# Patient Record
Sex: Female | Born: 1950 | Race: White | Hispanic: No | Marital: Married | Smoking: Former smoker
Health system: Southern US, Community
[De-identification: ages and names within clinical notes are randomized; demographics above are authoritative.]

## PROBLEM LIST (undated history)

## (undated) DIAGNOSIS — M419 Scoliosis, unspecified: Secondary | ICD-10-CM

## (undated) DIAGNOSIS — Z862 Personal history of diseases of the blood and blood-forming organs and certain disorders involving the immune mechanism: Secondary | ICD-10-CM

## (undated) DIAGNOSIS — R569 Unspecified convulsions: Secondary | ICD-10-CM

## (undated) DIAGNOSIS — F329 Major depressive disorder, single episode, unspecified: Secondary | ICD-10-CM

## (undated) DIAGNOSIS — F0280 Dementia in other diseases classified elsewhere without behavioral disturbance: Secondary | ICD-10-CM

## (undated) DIAGNOSIS — G300 Alzheimer's disease with early onset: Principal | ICD-10-CM

## (undated) DIAGNOSIS — M8580 Other specified disorders of bone density and structure, unspecified site: Secondary | ICD-10-CM

## (undated) DIAGNOSIS — F909 Attention-deficit hyperactivity disorder, unspecified type: Secondary | ICD-10-CM

## (undated) DIAGNOSIS — F32A Depression, unspecified: Secondary | ICD-10-CM

## (undated) HISTORY — DX: Alzheimer's disease with early onset: G30.0

## (undated) HISTORY — DX: Attention-deficit hyperactivity disorder, unspecified type: F90.9

## (undated) HISTORY — PX: NASAL SINUS SURGERY: SHX719

## (undated) HISTORY — PX: BREAST ENHANCEMENT SURGERY: SHX7

## (undated) HISTORY — DX: Other specified disorders of bone density and structure, unspecified site: M85.80

## (undated) HISTORY — DX: Depression, unspecified: F32.A

## (undated) HISTORY — DX: Major depressive disorder, single episode, unspecified: F32.9

## (undated) HISTORY — DX: Personal history of diseases of the blood and blood-forming organs and certain disorders involving the immune mechanism: Z86.2

## (undated) HISTORY — DX: Scoliosis, unspecified: M41.9

## (undated) HISTORY — DX: Unspecified convulsions: R56.9

## (undated) HISTORY — DX: Dementia in other diseases classified elsewhere, unspecified severity, without behavioral disturbance, psychotic disturbance, mood disturbance, and anxiety: F02.80

---

## 1998-12-28 ENCOUNTER — Other Ambulatory Visit: Admission: RE | Admit: 1998-12-28 | Discharge: 1998-12-28 | Payer: Self-pay | Admitting: Internal Medicine

## 2000-01-03 DIAGNOSIS — M8580 Other specified disorders of bone density and structure, unspecified site: Secondary | ICD-10-CM

## 2000-01-03 HISTORY — DX: Other specified disorders of bone density and structure, unspecified site: M85.80

## 2002-05-30 ENCOUNTER — Other Ambulatory Visit: Admission: RE | Admit: 2002-05-30 | Discharge: 2002-05-30 | Payer: Self-pay | Admitting: Family Medicine

## 2004-05-13 ENCOUNTER — Other Ambulatory Visit: Admission: RE | Admit: 2004-05-13 | Discharge: 2004-05-13 | Payer: Self-pay | Admitting: Family Medicine

## 2004-05-13 ENCOUNTER — Ambulatory Visit: Payer: Self-pay | Admitting: Family Medicine

## 2004-05-31 ENCOUNTER — Ambulatory Visit: Payer: Self-pay | Admitting: Cardiology

## 2004-06-02 HISTORY — PX: NM MYOVIEW LTD: HXRAD82

## 2004-06-07 ENCOUNTER — Ambulatory Visit: Payer: Self-pay

## 2004-06-07 ENCOUNTER — Ambulatory Visit: Payer: Self-pay | Admitting: Cardiology

## 2004-06-09 ENCOUNTER — Ambulatory Visit: Payer: Self-pay | Admitting: Family Medicine

## 2004-07-01 ENCOUNTER — Ambulatory Visit: Payer: Self-pay | Admitting: Internal Medicine

## 2004-07-08 ENCOUNTER — Ambulatory Visit: Payer: Self-pay | Admitting: Family Medicine

## 2004-08-24 ENCOUNTER — Ambulatory Visit: Payer: Self-pay | Admitting: Family Medicine

## 2004-08-24 ENCOUNTER — Encounter (INDEPENDENT_AMBULATORY_CARE_PROVIDER_SITE_OTHER): Payer: Self-pay | Admitting: Specialist

## 2004-08-24 ENCOUNTER — Other Ambulatory Visit: Admission: RE | Admit: 2004-08-24 | Discharge: 2004-08-24 | Payer: Self-pay | Admitting: Family Medicine

## 2006-02-15 ENCOUNTER — Ambulatory Visit: Payer: Self-pay | Admitting: Family Medicine

## 2006-02-15 ENCOUNTER — Other Ambulatory Visit: Admission: RE | Admit: 2006-02-15 | Discharge: 2006-02-15 | Payer: Self-pay | Admitting: Family Medicine

## 2006-02-15 ENCOUNTER — Encounter: Payer: Self-pay | Admitting: Family Medicine

## 2006-05-04 ENCOUNTER — Telehealth (INDEPENDENT_AMBULATORY_CARE_PROVIDER_SITE_OTHER): Payer: Self-pay | Admitting: *Deleted

## 2006-06-28 ENCOUNTER — Telehealth (INDEPENDENT_AMBULATORY_CARE_PROVIDER_SITE_OTHER): Payer: Self-pay | Admitting: *Deleted

## 2006-08-07 ENCOUNTER — Telehealth (INDEPENDENT_AMBULATORY_CARE_PROVIDER_SITE_OTHER): Payer: Self-pay | Admitting: *Deleted

## 2006-09-19 ENCOUNTER — Telehealth (INDEPENDENT_AMBULATORY_CARE_PROVIDER_SITE_OTHER): Payer: Self-pay | Admitting: *Deleted

## 2006-10-03 ENCOUNTER — Telehealth (INDEPENDENT_AMBULATORY_CARE_PROVIDER_SITE_OTHER): Payer: Self-pay | Admitting: *Deleted

## 2006-11-23 ENCOUNTER — Telehealth (INDEPENDENT_AMBULATORY_CARE_PROVIDER_SITE_OTHER): Payer: Self-pay | Admitting: *Deleted

## 2006-12-11 ENCOUNTER — Telehealth (INDEPENDENT_AMBULATORY_CARE_PROVIDER_SITE_OTHER): Payer: Self-pay | Admitting: *Deleted

## 2007-01-23 ENCOUNTER — Telehealth: Payer: Self-pay | Admitting: Family Medicine

## 2007-03-20 ENCOUNTER — Telehealth: Payer: Self-pay | Admitting: Family Medicine

## 2007-06-26 ENCOUNTER — Ambulatory Visit: Payer: Self-pay | Admitting: Psychology

## 2007-07-08 ENCOUNTER — Ambulatory Visit: Payer: Self-pay | Admitting: Psychology

## 2007-07-08 ENCOUNTER — Telehealth: Payer: Self-pay | Admitting: Family Medicine

## 2007-07-11 ENCOUNTER — Encounter: Payer: Self-pay | Admitting: Family Medicine

## 2007-07-11 ENCOUNTER — Other Ambulatory Visit: Admission: RE | Admit: 2007-07-11 | Discharge: 2007-07-11 | Payer: Self-pay | Admitting: Family Medicine

## 2007-07-11 ENCOUNTER — Ambulatory Visit: Payer: Self-pay | Admitting: Family Medicine

## 2007-07-11 DIAGNOSIS — M810 Age-related osteoporosis without current pathological fracture: Secondary | ICD-10-CM | POA: Insufficient documentation

## 2007-07-11 DIAGNOSIS — F418 Other specified anxiety disorders: Secondary | ICD-10-CM | POA: Insufficient documentation

## 2007-07-11 LAB — CONVERTED CEMR LAB: Pap Smear: NORMAL

## 2007-07-25 ENCOUNTER — Telehealth (INDEPENDENT_AMBULATORY_CARE_PROVIDER_SITE_OTHER): Payer: Self-pay | Admitting: *Deleted

## 2007-07-26 ENCOUNTER — Ambulatory Visit: Payer: Self-pay | Admitting: Family Medicine

## 2007-07-26 LAB — CONVERTED CEMR LAB
Nitrite: NEGATIVE
Protein, U semiquant: NEGATIVE
Urine crystals, microscopic: 0 /hpf
Urobilinogen, UA: 0.2
Yeast, UA: 0

## 2007-07-27 ENCOUNTER — Encounter: Payer: Self-pay | Admitting: Family Medicine

## 2007-07-29 ENCOUNTER — Ambulatory Visit: Payer: Self-pay | Admitting: Psychology

## 2007-08-21 ENCOUNTER — Telehealth: Payer: Self-pay | Admitting: Family Medicine

## 2007-08-23 ENCOUNTER — Ambulatory Visit: Payer: Self-pay | Admitting: Psychology

## 2007-08-30 ENCOUNTER — Telehealth (INDEPENDENT_AMBULATORY_CARE_PROVIDER_SITE_OTHER): Payer: Self-pay | Admitting: *Deleted

## 2007-09-05 ENCOUNTER — Telehealth (INDEPENDENT_AMBULATORY_CARE_PROVIDER_SITE_OTHER): Payer: Self-pay | Admitting: *Deleted

## 2007-09-10 ENCOUNTER — Encounter: Payer: Self-pay | Admitting: Family Medicine

## 2007-09-10 LAB — HM MAMMOGRAPHY: HM Mammogram: NORMAL

## 2007-10-07 ENCOUNTER — Ambulatory Visit: Payer: Self-pay | Admitting: Family Medicine

## 2007-10-08 LAB — CONVERTED CEMR LAB
ALT: 39 units/L — ABNORMAL HIGH (ref 0–35)
AST: 34 units/L (ref 0–37)
Albumin: 4 g/dL (ref 3.5–5.2)
BUN: 16 mg/dL (ref 6–23)
Basophils Absolute: 0 10*3/uL (ref 0.0–0.1)
Basophils Relative: 0.5 % (ref 0.0–3.0)
CO2: 29 meq/L (ref 19–32)
Calcium: 9.3 mg/dL (ref 8.4–10.5)
Chloride: 107 meq/L (ref 96–112)
Cholesterol: 182 mg/dL (ref 0–200)
Creatinine, Ser: 0.8 mg/dL (ref 0.4–1.2)
Eosinophils Relative: 1.1 % (ref 0.0–5.0)
Hemoglobin: 13.9 g/dL (ref 12.0–15.0)
LDL Cholesterol: 80 mg/dL (ref 0–99)
Lymphocytes Relative: 23.3 % (ref 12.0–46.0)
MCHC: 35.3 g/dL (ref 30.0–36.0)
MCV: 91.6 fL (ref 78.0–100.0)
Neutro Abs: 4.5 10*3/uL (ref 1.4–7.7)
Neutrophils Relative %: 65.5 % (ref 43.0–77.0)
RBC: 4.29 M/uL (ref 3.87–5.11)
TSH: 0.92 microintl units/mL (ref 0.35–5.50)
Total Protein: 7.3 g/dL (ref 6.0–8.3)
VLDL: 18 mg/dL (ref 0–40)
WBC: 6.9 10*3/uL (ref 4.5–10.5)

## 2007-10-09 LAB — CONVERTED CEMR LAB: Vit D, 1,25-Dihydroxy: 36 (ref 30–89)

## 2007-10-14 ENCOUNTER — Ambulatory Visit: Payer: Self-pay | Admitting: Family Medicine

## 2007-10-14 DIAGNOSIS — M4120 Other idiopathic scoliosis, site unspecified: Secondary | ICD-10-CM | POA: Insufficient documentation

## 2007-10-14 DIAGNOSIS — E785 Hyperlipidemia, unspecified: Secondary | ICD-10-CM | POA: Insufficient documentation

## 2007-10-28 ENCOUNTER — Telehealth: Payer: Self-pay | Admitting: Family Medicine

## 2007-12-18 ENCOUNTER — Telehealth: Payer: Self-pay | Admitting: Family Medicine

## 2007-12-19 ENCOUNTER — Telehealth: Payer: Self-pay | Admitting: Family Medicine

## 2008-01-13 ENCOUNTER — Ambulatory Visit: Payer: Self-pay | Admitting: Psychology

## 2008-02-25 ENCOUNTER — Ambulatory Visit: Payer: Self-pay | Admitting: Psychology

## 2008-03-02 ENCOUNTER — Telehealth: Payer: Self-pay | Admitting: Family Medicine

## 2008-04-28 ENCOUNTER — Telehealth: Payer: Self-pay | Admitting: Family Medicine

## 2008-04-29 ENCOUNTER — Ambulatory Visit: Payer: Self-pay | Admitting: Psychology

## 2008-05-02 HISTORY — PX: OTHER SURGICAL HISTORY: SHX169

## 2008-05-05 ENCOUNTER — Telehealth: Payer: Self-pay | Admitting: Family Medicine

## 2008-05-05 ENCOUNTER — Ambulatory Visit: Payer: Self-pay | Admitting: Family Medicine

## 2008-05-05 DIAGNOSIS — N950 Postmenopausal bleeding: Secondary | ICD-10-CM | POA: Insufficient documentation

## 2008-05-12 ENCOUNTER — Ambulatory Visit (HOSPITAL_COMMUNITY): Admission: RE | Admit: 2008-05-12 | Discharge: 2008-05-12 | Payer: Self-pay | Admitting: Family Medicine

## 2008-07-07 ENCOUNTER — Telehealth: Payer: Self-pay | Admitting: Family Medicine

## 2008-08-19 ENCOUNTER — Telehealth: Payer: Self-pay | Admitting: Family Medicine

## 2008-09-03 ENCOUNTER — Telehealth: Payer: Self-pay | Admitting: Family Medicine

## 2008-10-14 ENCOUNTER — Telehealth: Payer: Self-pay | Admitting: Family Medicine

## 2008-12-07 ENCOUNTER — Telehealth: Payer: Self-pay | Admitting: Family Medicine

## 2009-02-18 ENCOUNTER — Telehealth: Payer: Self-pay | Admitting: Family Medicine

## 2009-03-15 ENCOUNTER — Ambulatory Visit: Payer: Self-pay | Admitting: Family Medicine

## 2009-03-15 ENCOUNTER — Telehealth: Payer: Self-pay | Admitting: Family Medicine

## 2009-03-15 DIAGNOSIS — R5381 Other malaise: Secondary | ICD-10-CM | POA: Insufficient documentation

## 2009-03-15 DIAGNOSIS — R5383 Other fatigue: Secondary | ICD-10-CM

## 2009-03-16 ENCOUNTER — Encounter: Payer: Self-pay | Admitting: Family Medicine

## 2009-03-16 LAB — CONVERTED CEMR LAB
ALT: 18 units/L (ref 0–35)
Alkaline Phosphatase: 104 units/L (ref 39–117)
BUN: 14 mg/dL (ref 6–23)
Bilirubin, Direct: 0 mg/dL (ref 0.0–0.3)
Calcium: 9.5 mg/dL (ref 8.4–10.5)
Creatinine, Ser: 0.7 mg/dL (ref 0.4–1.2)
Eosinophils Relative: 0.3 % (ref 0.0–5.0)
GFR calc non Af Amer: 91 mL/min (ref >60)
Lymphocytes Relative: 23 % (ref 12.0–46.0)
MCV: 91 fL (ref 78.0–100.0)
Monocytes Absolute: 0.5 10*3/uL (ref 0.1–1.0)
Monocytes Relative: 6.7 % (ref 3.0–12.0)
Neutrophils Relative %: 69.7 % (ref 43.0–77.0)
Platelets: 248 10*3/uL (ref 150.0–400.0)
RBC: 4.6 M/uL (ref 3.87–5.11)
Total Bilirubin: 0.4 mg/dL (ref 0.3–1.2)
Vitamin B-12: 347 pg/mL (ref 211–911)
WBC: 6.9 10*3/uL (ref 4.5–10.5)

## 2009-03-22 ENCOUNTER — Telehealth: Payer: Self-pay | Admitting: Family Medicine

## 2009-04-27 ENCOUNTER — Encounter (INDEPENDENT_AMBULATORY_CARE_PROVIDER_SITE_OTHER): Payer: Self-pay | Admitting: *Deleted

## 2009-05-25 ENCOUNTER — Encounter (INDEPENDENT_AMBULATORY_CARE_PROVIDER_SITE_OTHER): Payer: Self-pay | Admitting: *Deleted

## 2009-05-28 ENCOUNTER — Telehealth: Payer: Self-pay | Admitting: Family Medicine

## 2009-07-28 ENCOUNTER — Telehealth: Payer: Self-pay | Admitting: Family Medicine

## 2009-08-02 ENCOUNTER — Ambulatory Visit: Payer: Self-pay | Admitting: Family Medicine

## 2009-10-04 ENCOUNTER — Ambulatory Visit: Payer: Self-pay | Admitting: Family Medicine

## 2009-10-04 DIAGNOSIS — E559 Vitamin D deficiency, unspecified: Secondary | ICD-10-CM

## 2009-10-06 ENCOUNTER — Telehealth: Payer: Self-pay | Admitting: Family Medicine

## 2009-11-10 ENCOUNTER — Encounter: Payer: Self-pay | Admitting: Family Medicine

## 2009-11-18 ENCOUNTER — Encounter (INDEPENDENT_AMBULATORY_CARE_PROVIDER_SITE_OTHER): Payer: Self-pay | Admitting: *Deleted

## 2009-11-18 ENCOUNTER — Ambulatory Visit: Payer: Self-pay | Admitting: Family Medicine

## 2009-11-23 LAB — CONVERTED CEMR LAB
Cholesterol: 236 mg/dL — ABNORMAL HIGH (ref 0–200)
HDL: 80.3 mg/dL (ref >39.00)
Total CHOL/HDL Ratio: 3
Triglycerides: 133 mg/dL (ref 0.0–149.0)

## 2009-12-14 ENCOUNTER — Encounter: Payer: Self-pay | Admitting: Family Medicine

## 2009-12-16 ENCOUNTER — Encounter: Payer: Self-pay | Admitting: Family Medicine

## 2009-12-29 ENCOUNTER — Encounter (INDEPENDENT_AMBULATORY_CARE_PROVIDER_SITE_OTHER): Payer: Self-pay | Admitting: *Deleted

## 2009-12-29 ENCOUNTER — Ambulatory Visit: Payer: Self-pay | Admitting: Family Medicine

## 2010-01-04 ENCOUNTER — Telehealth: Payer: Self-pay | Admitting: Family Medicine

## 2010-01-18 ENCOUNTER — Ambulatory Visit: Admit: 2010-01-18 | Payer: Self-pay | Admitting: Family Medicine

## 2010-02-01 NOTE — Progress Notes (Signed)
Summary: adderall   Phone Note Refill Request Call back at [phone] Message from:  Patient on February 18, 2009 12:01 PM  Refills Requested: Medication #1:  ADDERALL 10 MG TABS 1 by mouth each  am and each  lunchtime Please call patient when ready for pick up.   Method Requested: Pick up at Office Initial call taken by: Lacretia Nicks,  February 18, 2009 12:02 PM  Follow-up for Phone Call        printed in put in nurse in box for pickup  Follow-up by: Allena Earing MD,  February 19, 2009 8:08 AM  Additional Follow-up for Phone Call Additional follow up Details #1::        Patient notified Rx ready for pick up, left at front desk. Additional Follow-up by: Sherrian Divers CMA Deborra Medina),  February 19, 2009 11:42 AM    Prescriptions: ADDERALL 10 MG TABS (AMPHETAMINE-DEXTROAMPHETAMINE) 1 by mouth each  am and each  lunchtime  #60 x 0   Entered and Authorized by:   Allena Earing MD   Signed by:   Allena Earing MD on 02/19/2009   Method used:   Print then Give to Patient   RxID:   [id number]

## 2010-02-01 NOTE — Letter (Signed)
Summary: [place] No Show Letter  [place] at [facility]  [address] [place], [place] [address]   Phone: [phone]  Fax: [phone]    04/27/2009 MRN: [id number]  Lisa Mclaughlin [name], [name]  [address]   Dear Lisa Mclaughlin,   [facility] records indicate that you missed your scheduled appointment with ___lab__________________ on __4.26.11__________.  Please contact this office to reschedule your appointment as soon as possible.  It is important that you keep your scheduled appointments with your physician, so we can provide you the best care possible.  Please be advised that there may be a charge for "no show" appointments.    Sincerely,   [place] at [facility]

## 2010-02-01 NOTE — Letter (Signed)
Summary: Pre Visit No Show Letter  [facility]  [place], [place] [address]   Phone: [phone]  Fax: [phone]        November 18, 2009 MRN: [id number]    Lisa Mclaughlin [address] [place], [place]  [address]    Dear [name],   We have been unable to reach you by phone concerning the pre-procedure visit that you missed on 11/18/09. For this reason,your procedure scheduled on 12/02/09 has been cancelled. Our scheduling staff will gladly assist you with rescheduling your appointments at a more convenient time. Please call our office at [phone] between the hours of 8:00am and 5:00pm, press option #2 to reach an appointment scheduler. Please consider updating your contact numbers at this time so that we can reach you by phone in the future with schedule changes or results.    Thank you,    Emerson Monte RN [facility]

## 2010-02-01 NOTE — Progress Notes (Signed)
Summary: adderall   Phone Note Refill Request Call back at [phone] Message from:  Patient on May 28, 2009 11:50 AM  Refills Requested: Medication #1:  ADDERALL 10 MG TABS 1 by mouth each  am and each  lunchtime  Method Requested: Pick up at Office Initial call taken by: Lacretia Nicks,  May 28, 2009 11:50 AM  Follow-up for Phone Call        printed in put in nurse in box for pickup  Follow-up by: Allena Earing MD,  May 28, 2009 11:58 AM  Additional Follow-up for Phone Call Additional follow up Details #1::        Left message for patient to call back. Prescription left at front desk.  Ozzie Hoyle LPN  May 27, 624THL QA348G PM   Patient notified as instructed by telephone. Ozzie Hoyle LPN  May 27, 624THL 075-GRM PM     Prescriptions: ADDERALL 10 MG TABS (AMPHETAMINE-DEXTROAMPHETAMINE) 1 by mouth each  am and each  lunchtime  #60 x 0   Entered and Authorized by:   Allena Earing MD   Signed by:   Allena Earing MD on 05/28/2009   Method used:   Print then Give to Patient   RxID:   [id number]

## 2010-02-01 NOTE — Miscellaneous (Signed)
Summary: PT Eval/[facility]rapies  PT Eval/Integr[facility] Therapies   Imported By: Edmonia James 11/18/2009 08:30:33  _____________________________________________________________________  External Attachment:    Type:   Image     Comment:   External Document

## 2010-02-01 NOTE — Miscellaneous (Signed)
Summary: Ergocalciferol  Clinical Lists Changes  Medications: Added new medication of ERGOCALCIFEROL 50000 UNIT CAPS (ERGOCALCIFEROL) Take one tablet by mouth once weekly x 10 weeks. - Signed Rx of ERGOCALCIFEROL 50000 UNIT CAPS (ERGOCALCIFEROL) Take one tablet by mouth once weekly x 10 weeks.;  #10 x 0;  Signed;  Entered by: Christena Deem CMA (AAMA);  Authorized by: Allena Earing MD;  Method [facility] [address] [phone]*, [place] [place], [place], [place]  [address], Ph: [phone] or [phone], Fax: [phone]    Prescriptions: ERGOCALCIFEROL 50000 UNIT CAPS (ERGOCALCIFEROL) Take one tablet by mouth once weekly x 10 weeks.  #10 x 0   Entered by:   Christena Deem CMA ([place])   Authorized by:   Allena Earing MD   Signed by:   Christena Deem CMA Deborra Medina) on 03/16/2009   Method used:   [facility] to        [facility] [address] [phone]* (retail)       [address]       [place], [place]  [address]       Ph: [phone] or [phone]       Fax: [phone]   RxID:   [id number]

## 2010-02-01 NOTE — Progress Notes (Signed)
Summary: adderall  Phone Note Refill Request Call back at [phone]   Refills Requested: Medication #1:  ADDERALL 10 MG TABS 1 by mouth each  am and each  lunchtime  Method Requested: Pick up at Office Initial call taken by: Lacretia Nicks,  July 28, 2009 10:39 AM  Follow-up for Phone Call        printed in put in nurse in box for pickup  Follow-up by: Allena Earing MD,  July 28, 2009 12:05 PM  Additional Follow-up for Phone Call Additional follow up Details #1::        Patient notified as instructed by telephone. Prescription left at front desk. Ozzie Hoyle LPN  July 27, 624THL 624THL PM     Prescriptions: ADDERALL 10 MG TABS (AMPHETAMINE-DEXTROAMPHETAMINE) 1 by mouth each  am and each  lunchtime  #60 x 0   Entered and Authorized by:   Allena Earing MD   Signed by:   Allena Earing MD on 07/28/2009   Method used:   Print then Give to Patient   RxID:   [id number]

## 2010-02-01 NOTE — Assessment & Plan Note (Signed)
Summary: feeling tired/ alc   Vital Signs:  Patient profile:   60 year old female Height:      62 inches Weight:      139 pounds BMI:     25.52 Temp:     98.0 degrees F oral Pulse rate:   80 / minute Pulse rhythm:   regular BP sitting:   120 / 80  (right arm) Cuff size:   regular  Vitals Entered By: Sherrian Divers CMA Deborra Medina) (March 15, 2009 2:03 PM) CC: fees tired   History of Present Illness: really tired and no energy and low motivation   has not been taking any of her medications has continued her adderall   mother died 2 weeks ago  she had alzheimers   maybe a little depressed  sometimes a little teary mostly dismotivated and low energy and no joy  no time for exercise but does walk the dog frequently   no suicidal thoughts   needs to get back on her meds   needs f/u with her ortho about scoliosis which is worse- Dr chow  is also interested in integrative tx     Allergies: 1)  ! Tetracycline Hcl (Tetracycline Hcl)  Past History:  Past Medical History: Last updated: 10/14/2007 scoliosis osteoporosis  anemia in past  adult ADD depression  Past Surgical History: Last updated: 05/16/2008 C-S times 3 sinus surgery 2002 dexa- osteopenia breast implants 6/06 stress myoview normal  5/10 pelvic ultrasound - thickened endometrium and ? 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 Eyes:  Denies blurring and eye irritation. CV:  Denies chest pain or discomfort, palpitations, shortness of breath with exertion, and swelling of feet. Resp:  Denies  cough and shortness of breath. GI:  Denies abdominal pain, bloody stools, and change in bowel habits. MS:  Complains of low back pain, mid back pain, and stiffness; denies cramps. Derm:  Denies lesion(s), poor wound healing, and rash. Neuro:  Denies numbness and tingling. Psych:  Complains of depression; denies panic attacks, sense of great danger, and suicidal thoughts/plans. Endo:  Denies excessive thirst and excessive urination. Heme:  Denies abnormal bruising and bleeding.  Physical Exam  General:  Well-developed,well-nourished,in no acute distress; alert,appropriate and cooperative throughout examination Head:  normocephalic, atraumatic, and no abnormalities observed.   Eyes:  vision grossly intact, pupils equal, pupils round, and pupils reactive to light.  no conj pallor Mouth:  Oral mucosa and oropharynx without lesions or exudates.  Teeth in good repair. Neck:  No deformities, masses, or tenderness noted. Chest Wall:  No deformities, masses, or tenderness noted. Lungs:  Normal respiratory effort, chest expands symmetrically. Lungs are clear to auscultation, no crackles or wheezes. Heart:  Normal rate and regular rhythm. S1 and S2 normal without gallop, murmur, click, rub or other extra sounds. Abdomen:  Bowel sounds positive,abdomen soft and non-tender without masses, organomegaly or hernias noted. Msk:  scoliosis noted no acute joint changes nl gait  Extremities:  No clubbing, cyanosis, edema, or deformity noted with normal full range of motion of all joints.   Neurologic:  cranial nerves  II-XII intact, sensation intact to light touch, gait normal, and DTRs symmetrical and normal.  no tremor  Skin:  Intact without suspicious lesions or rashes Cervical Nodes:  No lymphadenopathy noted Inguinal Nodes:  No significant adenopathy Psych:  down and fatigued not tearful good eye contact and comm skills   Impression & Recommendations:  Problem # 1:  FATIGUE  (ICD-780.79) Assessment New strongly suspect this is grief and depression related (also after stopping med) lab today start back on wellbutrin and update  adv to update asap if worse  Orders: Venipuncture [id number]) TLB-BMP (Basic Metabolic Panel-BMET) (99991111) TLB-CBC Platelet - w/Differential (85025-CBCD) TLB-Hepatic/Liver Function Pnl (80076-HEPATIC) TLB-TSH (Thyroid Stimulating Hormone) (84443-TSH) TLB-B12 + Folate Pnl (82746_82607-B12/FOL) T-Vitamin D (25-Hydroxy) [id number]) Specimen Handling (99000)  Problem # 2:  DEPRESSIVE DISORDER (ICD-311) Assessment: Deteriorated  with acute grief and lethargy start back wellbutrin declines counseling at this time but will call back if she changes her mind  disc situational stress/ coping mech/ support sources/ sympt/ tx opt and poss side eff in detail today  Her updated medication list for this problem includes:    Wellbutrin Xl 300 Mg Tb24 (Bupropion hcl) .Marland Kitchen... 1 by mouth once daily  Orders: Prescription Created Electronically [phone])  Problem # 3:  SCOLIOSIS (ICD-737.30) Assessment: Deteriorated pain is worse disc exercise plan ref to PT - integ tx  if not imp will return to ortho  Orders: Physical Therapy Referral (PT)  Complete Medication List: 1)  Fosamax 70 Mg Tabs (Alendronate sodium) .... Take one by mouth weekly 2)  Vytorin 10-20 Mg Tabs (Ezetimibe-simvastatin) .... Take one by mouth daily 3)  Wellbutrin Xl 300 Mg Tb24 (Bupropion hcl) .Marland Kitchen.. 1 by mouth once daily 4)  Adderall 10 Mg Tabs (Amphetamine-dextroamphetamine) .Marland Kitchen.. 1 by mouth each  am and each  lunchtime 5)  Calcium + Vit D  .... Daily 6)  Multivitamins Tabs (Multiple vitamin) .... Daily 7)  Aspirin Adult Low Strength 81 Mg Tbec (Aspirin) .... Daily  Patient Instructions: 1)  start back on your medications  2)  we will do ref for integrative tx at check out  3)  keep walking 4)  labs today  5)  update me if not improved in a month 6)  let me know  if you need counseling  Prescriptions: ADDERALL 10 MG TABS (AMPHETAMINE-DEXTROAMPHETAMINE) 1 by mouth each  am and each  lunchtime  #60 x 0   Entered and Authorized by:   Allena Earing MD   Signed by:   Allena Earing MD on 03/15/2009   Method used:   Print then Give to Patient   RxID:   [id number] WELLBUTRIN XL 300 MG  TB24 (BUPROPION HCL) 1 by mouth once daily  #30 x 11   Entered and Authorized by:   Allena Earing MD   Signed by:   Allena Earing MD on 03/15/2009   Method used:   [facility] to        [facility] [address] [phone]* (retail)       [address]       [place], [place]  [address]       Ph: [phone] or [phone]       Fax: [phone]   RxID:   [id number] VYTORIN 10-20 MG TABS (EZETIMIBE-SIMVASTATIN) Take one by mouth daily  #30 x 11   Entered and Authorized by:   Allena Earing MD   Signed by:   [facility]  Nilsa Nutting MD on 03/15/2009   Method used:   Electronically to        [facility] [address] [phone]* (retail)       [address]       [place], [place]  [address]       Ph: [phone] or [phone]       Fax: [phone]   RxID:   [id number] FOSAMAX 70 MG TABS (ALENDRONATE SODIUM) Take one by mouth weekly  #4 x 11   Entered and Authorized by:   Allena Earing MD   Signed by:   Allena Earing MD on 03/15/2009   Method used:   Electronically to        [facility] [address] [phone]* (retail)       [address]       [place], [place]  [address]       Ph: [phone] or [phone]       Fax: [phone]   RxID:   [id number]   Current Allergies (reviewed today): ! TETRACYCLINE HCL (TETRACYCLINE HCL)

## 2010-02-01 NOTE — Progress Notes (Signed)
Summary: Prior Authorization Vytorin 10-20  Phone Note From Pharmacy Call back at ph [phone] fax [phone]   [facility] [address] [phone]* Call For: Dr. Glori Bickers  Summary of Call: Received fax from pharmacy stating that PA is needed for Vytorin 10-20.  Called [facility] at [phone], forms are being faxed to our office, could take up to one hours to received them.  Sherrian Divers CMA Deborra Medina)  March 15, 2009 4:00 PM   Received PA forms, in your IN box.   Initial call taken by: Sherrian Divers CMA Deborra Medina),  March 15, 2009 4:17 PM  Follow-up for Phone Call        please ask her if she has tried/ failed or been intolerant of other chol meds in past - thanks  Follow-up by: Allena Earing MD,  March 15, 2009 5:40 PM  Additional Follow-up for Phone Call Additional follow up Details #1::        Left message for patient to call back. Ozzie Hoyle LPN  March 14, 624THL 624THL PM   Pt has never tried another med for cholesterol.Ozzie Hoyle LPN  March 15, 624THL QA348G AM   thanks- could you please pull old paper chart if there is one? -  Additional Follow-up by: Allena Earing MD,  March 16, 2009 12:06 PM    Additional Follow-up for Phone Call Additional follow up Details #2::    Paper chart is on your desk next to in box. Thank you. Ozzie Hoyle LPN  March 15, 624THL 624THL PM   thank- I reviewed it  please let pt know I will do prior auth but doubt they will cover it since she has not been on cheaper meds in past -- which is a shame  I will fill out anyway just to try form done and in nurse in box  Follow-up by: Allena Earing MD,  March 16, 2009 1:01 PM  Additional Follow-up for Phone Call Additional follow up Details #3:: Details for Additional Follow-up Action Taken: Left message for patient to call back. Completed form faxed to [phone]. Original form given back to [facility] needed later. Ozzie Hoyle LPN  March 15, 624THL 075-GRM PM    Appended Document: Prior Authorization Vytorin  10-20 Received Denial from [facility] for Vytorin because patient has not tried Crestor.  Patient advised.    Appended Document: Prior Authorization Vytorin 10-20 please let me know if she wnts to try crestor -- it is a good med -- let me know and I will do px for [facility]   Appended Document: Prior Authorization Vytorin 10-20 See next phone note.  Appended Document: Prior Authorization Vytorin 10-20 Spoke with patient and she is willing to take Crestor.  She says that she has some Vytorin at home but she doesn't remember the dose.  I advised her not to take the Vytorin but wait to hear what Dr. Glori Bickers recommends that she do.  Please sent Crestor Rx to [facility].    Appended Document: Prior Authorization Vytorin 10-20 finish the vytorin she has  then start crestor -- I hate to go ahead and do 90 day px for [facility] until we know if it works or if she will need dose change - so stick to short term px until we know sched fasting lab for 1 mo after starting crestor lipid/ast/alt 272- then will adv further  Appended Document: Prior Authorization Vytorin 10-20 Patient advised as instructed.  She will finish  the Vytorin, she has about 15 pills left and then she will start the Crestor.  She will call back to schedule labs after she has been on Crestor for about 1 month.

## 2010-02-01 NOTE — Letter (Signed)
Summary: [place] No Show Letter  [place] at [facility]  [address] [place], [place] [address]   Phone: [phone]  Fax: [phone]    05/25/2009 MRN: [id number]  Lisa Mclaughlin [id number] [place], [place]  [address]   Dear Ms. [name],   [facility] records indicate that you missed your scheduled appointment with __lab___________________ on __5.24.11__________.  Please contact this office to reschedule your appointment as soon as possible.  It is important that you keep your scheduled appointments with your physician, so we can provide you the best care possible.  Please be advised that there may be a charge for "no show" appointments.    Sincerely,   [place] at [facility]

## 2010-02-01 NOTE — Progress Notes (Signed)
Summary: pt is willing to try crestor  Phone Note Call from Patient Call back at [phone]   Caller: Patient Call For: Allena Earing MD Summary of Call: Vytorin had been denied by insurance, pt is willing to try crestor.  Will need to be sent to [facility].  I think you might have the prior auth form. Initial call taken by: Marty Heck CMA,  March 22, 2009 11:26 AM  Follow-up for Phone Call        lets try crestor 10 to start  printed in put in nurse in box for pickup  please check fasting lab in 6 wk lipid/ast/alt 272  Follow-up by: Allena Earing MD,  March 23, 2009 8:16 AM  Additional Follow-up for Phone Call Additional follow up Details #1::        Patient Advised.   Will pick up Rx. tomorrow to mail in to [facility].  Lab appointment scheduled:  05/06/2009 at 8:25 a.m. (fasting)   Additional Follow-up by: Christena Deem CMA ([place]),  March 23, 2009 3:06 PM    New/Updated Medications: CRESTOR 10 MG TABS (ROSUVASTATIN CALCIUM) 1 by mouth once daily Prescriptions: CRESTOR 10 MG TABS (ROSUVASTATIN CALCIUM) 1 by mouth once daily  #90 x 3   Entered and Authorized by:   Allena Earing MD   Signed by:   Allena Earing MD on 03/23/2009   Method used:   Print then Give to Patient   RxID:   [id number]

## 2010-02-01 NOTE — Assessment & Plan Note (Signed)
Summary: GENERAL TALK/DLO   Vital Signs:  Patient profile:   60 year old female Height:      62 inches Weight:      138.75 pounds BMI:     25.47 Temp:     97.7 degrees F oral Pulse rate:   76 / minute Pulse rhythm:   regular BP sitting:   120 / 80  (left arm) Cuff size:   regular  Vitals Entered By: Ozzie Hoyle LPN (October  3, 624THL 9:32 AM) CC: wants to talk   History of Present Illness: here for f/u of depression- also low D and cholesterol  wants to regroup -- has not been taking her medicines and is ready to get back on track  last visit put back on wellbutrin took  it for a while and then stopped it  she stopped it for unknown reason -- too much going on  thinks that did help   2 nephews getting married -- a lot of stress there/running around buisness is ok -- keeps her running  overall doing ok with grief   low vit D level 24-- took her high dose tx for  a while - did not finish   crestor -- did not take it   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 Tetracycline Hcl (Tetracycline Hcl)  Family History: father AAA, CAD, HTN (MI @30  years old)--deceased  mother alz, CAD -- passed  PGF AAA uncle AAA GM leukemia sister HTN sister DM aunt Breast cancer   Review of Systems General:  Complains of fatigue; denies fever and loss of appetite. Eyes:  Denies blurring and eye irritation. CV:  Denies chest pain or discomfort, lightheadness, and palpitations. Resp:  Denies cough, shortness of breath, and wheezing. GI:  Denies abdominal pain, change in bowel habits, indigestion, and nausea. MS:  Complains of low back pain, muscle aches, and stiffness. Derm:  Denies itching, lesion(s), poor wound healing, and  rash. Neuro:  Denies numbness and tingling. Psych:  Complains of anxiety, depression, and irritability; denies panic attacks, sense of great danger, and suicidal thoughts/plans. Endo:  Denies cold intolerance, excessive thirst, excessive urination, and heat intolerance. Heme:  Denies abnormal bruising and bleeding.  Physical Exam  General:  Well-developed,well-nourished,in no acute distress; alert,appropriate and cooperative throughout examination Head:  normocephalic, atraumatic, and no abnormalities observed.   Eyes:  vision grossly intact, pupils equal, pupils round, and pupils reactive to light.  no conjunctival pallor, injection or icterus  Mouth:  pharynx pink and moist.   Neck:  supple with full rom and no masses or thyromegally, no JVD or carotid bruit  Chest Wall:  No deformities, masses, or tenderness noted. Lungs:  Normal respiratory effort, chest expands symmetrically. Lungs are clear to auscultation, no crackles or wheezes. Heart:  Normal rate and regular rhythm. S1 and S2 normal without gallop, murmur, click, rub or other extra sounds. Abdomen:  Bowel sounds positive,abdomen soft and non-tender without masses, organomegaly or hernias noted. no renal bruits  Msk:  No deformity or scoliosis noted of thoracic or lumbar spine.   Pulses:  R and L carotid,radial,femoral,dorsalis pedis and posterior tibial pulses are full and equal bilaterally Extremities:  No clubbing,  cyanosis, edema, or deformity noted with normal full range of motion of all joints.   Neurologic:  sensation intact to light touch, gait normal, and DTRs symmetrical and normal.   Skin:  Intact without suspicious lesions or rashes Cervical Nodes:  No lymphadenopathy noted Inguinal Nodes:  No significant adenopathy Psych:  seems generally down and a little fatigued  good eye contact and communication skills    Impression & Recommendations:  Problem # 1:  HYPERLIPIDEMIA (ICD-272.4) Assessment Deteriorated  will  get started on crestor and update if side eff or problems  lab in 6 wk f/u 3 mo rev low sat fat diet  Her updated medication list for this problem includes:    Crestor 10 Mg Tabs (Rosuvastatin calcium) .Marland Kitchen... 1 by mouth once daily  Labs Reviewed: SGOT: 20 (03/15/2009)   SGPT: 18 (03/15/2009)   HDL:84.3 (10/07/2007)  LDL:80 (10/07/2007)  Chol:182 (10/07/2007)  Trig:91 (10/07/2007)  Problem # 2:  SCOLIOSIS (ICD-737.30) Assessment: Deteriorated ongoing back pain prevents her from being active  ref to PT and update  Orders: Physical Therapy Referral (PT)  Problem # 3:  DEPRESSIVE DISORDER (ICD-311) Assessment: Unchanged with grief reaction  needs to start back on wellbutrin - though coping skills are good still fatigued with low motivation and anhedonia  f/u 3 mo - update if worse or no imp Her updated medication list for this problem includes:    Wellbutrin Xl 300 Mg Tb24 (Bupropion hcl) .Marland Kitchen... 1 by mouth once daily  Problem # 4:  UNSPECIFIED VITAMIN D DEFICIENCY (ICD-268.9) Assessment: Unchanged will start back on high dose tx disc imp to bone and overall help re check at her 3 mo f/u  Complete Medication List: 1)  Fosamax 70 Mg Tabs (Alendronate sodium) .... Take one by mouth weekly 2)  Wellbutrin Xl 300 Mg Tb24 (Bupropion hcl) .Marland Kitchen.. 1 by mouth once daily 3)  Adderall 10 Mg Tabs (Amphetamine-dextroamphetamine) .Marland Kitchen.. 1 by mouth each  am and each  lunchtime 4)  Calcium + Vit D  .... Daily 5)  Multivitamins Tabs (Multiple vitamin) .... Daily 6)  Aspirin Adult Low Strength 81 Mg Tbec (Aspirin) .... Daily 7)  Ergocalciferol 50000 Unit Caps (Ergocalciferol) .... Take one tablet by mouth once weekly x 10 weeks. 8)  Crestor 10 Mg Tabs (Rosuvastatin calcium) .Marland Kitchen.. 1 by mouth once daily  Other Orders: Admin 1st Vaccine [id number]) Flu Vaccine 71yrs + [phone])  Patient Instructions: 1)  get back on your regular medicine - especially the wellbutrin  2)  exercise as tolerated  3)  update me  if any problems or side effects from crestor  4)  eat healthy  5)  schedule fasting labs in 6 weeks for lipid/ast/alt 272 6)  follow up with me in 3 months  7)  we will do referral to physical therapy at check out  Prescriptions: WELLBUTRIN XL 300 MG  TB24 (BUPROPION HCL) 1 by mouth once daily  #90 x 3   Entered and Authorized by:   Allena Earing MD   Signed by:   Allena Earing MD on 10/04/2009   Method used:   [facility] to        [facility] [address] [phone]* (retail)       [address]       [place], [place]  [address]       Ph: [phone] or [phone]       Fax: [phone]   RxID:  [id number] CRESTOR 10 MG TABS (ROSUVASTATIN CALCIUM) 1 by mouth once daily  #90 x 3   Entered and Authorized by:   Allena Earing MD   Signed by:   Allena Earing MD on 10/04/2009   Method used:   [facility]        [facility] [address] [phone]* (retail)       [address]       [place], [place]  [address]       Ph: [phone] or [phone]       Fax: [phone]   RxID:   [id number] ERGOCALCIFEROL 50000 UNIT CAPS (ERGOCALCIFEROL) Take one tablet by mouth once weekly x 10 weeks.  #10 x 0   Entered and Authorized by:   Allena Earing MD   Signed by:   Allena Earing MD on 10/04/2009   Method used:   [facility]        [facility] [address] [phone]* (retail)       [address]       [place], [place]  [address]       Ph: [phone] or [phone]       Fax: [phone]   [id number]   Current Allergies (reviewed today): ! TETRACYCLINE HCL (TETRACYCLINE HCL)    Flu Vaccine Consent Questions     Do you have a history of severe allergic reactions to this vaccine? no    Any prior history of allergic reactions to egg and/or gelatin? no    Do you have a sensitivity to the preservative Thimersol? no    Do you have a past history of Guillan-Barre Syndrome? no    Do you  currently have an acute febrile illness? no    Have you ever had a severe reaction to latex? no    Vaccine information given and explained to patient? yes    Are you currently pregnant? no    Lot Number:[id number]   Exp Date:07/02/2010   Site Given  Left Deltoid IMlbflu Ozzie Hoyle LPN  October  3, 624THL 10:06 AM

## 2010-02-01 NOTE — Progress Notes (Signed)
Summary: refill request for adderall  Phone Note Refill Request Call back at [phone] Message from:  Patient  Refills Requested: Medication #1:  ADDERALL 10 MG TABS 1 by mouth each  am and each  lunchtime Please call when ready.  Initial call taken by: Marty Heck CMA,  October 06, 2009 3:02 PM  Follow-up for Phone Call        printed in put in nurse in box for pickup  Follow-up by: Allena Earing MD,  October 06, 2009 4:05 PM  Additional Follow-up for Phone Call Additional follow up Details #1::        Prescription left at front desk. Patient notified as instructed by telephone. Ozzie Hoyle LPN  October  6, 624THL 9:54 AM     New/Updated Medications: ADDERALL 10 MG TABS (AMPHETAMINE-DEXTROAMPHETAMINE) 1 by mouth each  am and each  lunchtime Prescriptions: ADDERALL 10 MG TABS (AMPHETAMINE-DEXTROAMPHETAMINE) 1 by mouth each  am and each  lunchtime  #60 x 0   Entered and Authorized by:   Allena Earing MD   Signed by:   Allena Earing MD on 10/06/2009   Method used:   Print then Give to Patient   RxID:   [id number]

## 2010-02-03 NOTE — Miscellaneous (Signed)
Summary: PT Re Eval/[facility]  PT Re Eval/[facility]   Imported By: Edmonia  12/18/2009 10:09:39  _____________________________________________________________________  External Attachment:    Type:   Image     Comment:   External Document

## 2010-02-03 NOTE — Progress Notes (Signed)
Summary: adderall  Phone Note Refill Request Call back at [phone] Message from:  Patient on January 04, 2010 4:01 PM  Refills Requested: Medication #1:  ADDERALL 10 MG TABS 1 by mouth each  am and each  lunchtime  Method Requested: Pick up at Office Initial call taken by: Lacretia Nicks,  January 04, 2010 4:01 PM  Follow-up for Phone Call        printed in put in nurse in box for pickup  Follow-up by: Allena Earing MD,  January 04, 2010 4:31 PM  Additional Follow-up for Phone Call Additional follow up Details #1::        Patient notified as instructed by telephone. Prescription left at front desk. Ozzie Hoyle LPN  January  3, [id number] 4:48 PM     Prescriptions: ADDERALL 10 MG TABS (AMPHETAMINE-DEXTROAMPHETAMINE) 1 by mouth each  am and each  lunchtime  #60 x 0   Entered and Authorized by:   Allena Earing MD   Signed by:   Allena Earing MD on 01/04/2010   Method used:   Print then Give to Patient   RxID:   [id number]

## 2010-02-03 NOTE — Letter (Signed)
Summary: [place] No Show Letter  Mar[place] at [facility]  [address] [place], [place] [address]   Phone: [phone]  Fax: [phone]    12/29/2009 MRN: [id number]  [place], [place]  [address]   Dear Ms. [name],   [place] records indicate that you missed your scheduled appointment with ___lab__________________ on __12.28.2011__________.  Please contact this office to reschedule your appointment as soon as possible.  It is important that you keep your scheduled appointments with your physician, so we can provide you the best care possible.  Please be advised that there may be a charge for "no show" appointments.    Sincerely,   [place] at [facility]

## 2010-02-03 NOTE — Letter (Signed)
Summary: [facility]   Imported By: Bubba Hales 01/04/2010 10:20:35  _____________________________________________________________________  External Attachment:    Type:   Image     Comment:   External Document

## 2010-03-07 ENCOUNTER — Telehealth: Payer: Self-pay | Admitting: Family Medicine

## 2010-03-15 NOTE — Progress Notes (Signed)
Summary: adderall  Phone Note Refill Request Call back at [phone] Message from:  Patient on March 07, 2010 11:31 AM  Refills Requested: Medication #1:  ADDERALL 10 MG TABS 1 by mouth each  am and each  lunchtime  Method Requested: Pick up at Office Initial call taken by: Lacretia Nicks,  March 07, 2010 11:31 AM  Follow-up for Phone Call        printed in put in nurse in box for pickup  Follow-up by: Allena Earing MD,  March 07, 2010 12:40 PM  Additional Follow-up for Phone Call Additional follow up Details #1::        Prescription left at front desk. Left message for patient to call back. Ozzie Hoyle LPN  March  5, [id number] 624THL PM   Advised pt script is ready for pick up. Additional Follow-up by: Marty Heck CMA, AAMA,  March 07, 2010 4:38 PM    Prescriptions: ADDERALL 10 MG TABS (AMPHETAMINE-DEXTROAMPHETAMINE) 1 by mouth each  am and each  lunchtime  #60 x 0   Entered and Authorized by:   Allena Earing MD   Signed by:   Allena Earing MD on 03/07/2010   Method used:   Print then Give to Patient   RxID:   [id number]

## 2010-05-24 ENCOUNTER — Other Ambulatory Visit: Payer: Self-pay | Admitting: *Deleted

## 2010-05-24 MED ORDER — AMPHETAMINE-DEXTROAMPHETAMINE 10 MG PO TABS: ORAL_TABLET | ORAL | Status: DC

## 2010-05-24 NOTE — Telephone Encounter (Signed)
Please call pt when ready.

## 2010-05-24 NOTE — Telephone Encounter (Signed)
Px printed for pick up in IN box  

## 2010-05-24 NOTE — Telephone Encounter (Signed)
Patient notified as instructed by telephone. Prescription left at front desk.  

## 2010-10-10 ENCOUNTER — Other Ambulatory Visit: Payer: Self-pay | Admitting: *Deleted

## 2010-10-10 MED ORDER — AMPHETAMINE-DEXTROAMPHETAMINE 10 MG PO TABS: ORAL_TABLET | ORAL | Status: DC

## 2010-10-10 NOTE — Telephone Encounter (Signed)
If I am right - she is about due for a visit - please schedule when able Px printed for pick up in IN box

## 2010-10-10 NOTE — Telephone Encounter (Signed)
Please call patient when ready. 

## 2010-10-10 NOTE — Telephone Encounter (Signed)
Pt came by to pick up rx and Roselyn Reef said she will make appt for pt.

## 2010-10-28 ENCOUNTER — Ambulatory Visit: Payer: Self-pay | Admitting: Family Medicine

## 2010-12-21 ENCOUNTER — Telehealth: Payer: Self-pay | Admitting: Internal Medicine

## 2010-12-21 NOTE — Telephone Encounter (Signed)
If abdominal pain  - go to ER  If she is taking pepto bismol- that can cause dark stools fyi Otherwise f/u as planned and update if condition worsens

## 2010-12-21 NOTE — Telephone Encounter (Signed)
Last couple of days when she eats she is having a BM with dark and soft thick.  Stomach is upset.  Made an appointment for Mon the 24th.

## 2010-12-21 NOTE — Telephone Encounter (Signed)
Still unable to reach pt. Left v/m on cell if abd pain to go to ER otherwise please call office 12/22/10.

## 2010-12-21 NOTE — Telephone Encounter (Signed)
Left v/m on pts cell # for pt to call back. Home # had been discontinued and work # was v/m for [employer].

## 2010-12-23 NOTE — Telephone Encounter (Signed)
Patient notified as instructed by telephone. Pt said she is doing fine now. The dark stool has resolved and no abdominal pain. Mon appt was cancelled but pt appreciated call and if problem returns pt will call back.

## 2010-12-26 ENCOUNTER — Ambulatory Visit: Payer: Self-pay | Admitting: Family Medicine

## 2010-12-30 ENCOUNTER — Ambulatory Visit: Payer: Self-pay | Admitting: Family Medicine

## 2011-01-04 ENCOUNTER — Ambulatory Visit (INDEPENDENT_AMBULATORY_CARE_PROVIDER_SITE_OTHER): Payer: Self-pay | Admitting: Family Medicine

## 2011-01-04 ENCOUNTER — Encounter: Payer: Self-pay | Admitting: Family Medicine

## 2011-01-04 VITALS — BP 146/88 | HR 68 | Temp 98.1°F | Ht 62.0 in | Wt 144.8 lb

## 2011-01-04 DIAGNOSIS — K589 Irritable bowel syndrome without diarrhea: Secondary | ICD-10-CM | POA: Insufficient documentation

## 2011-01-04 NOTE — Assessment & Plan Note (Signed)
Suspect stress related Rev diet No red flags for inflam bd or inf Needs screen colonosc - will call back to sched that For now recommend daily fiber and update  Given handout on IBS

## 2011-01-04 NOTE — Progress Notes (Signed)
Subjective:    Patient ID: Lisa Mclaughlin, female    DOB: [birth year], 61 y.o.   MRN: [id number]  HPI Here for loose stools   For the past 2 weeks - stomach churns all the time (not painful)  Loose stool on and off for 2 weeks  2-3 times per day  No blood in stool or cramping - no fever or chills   Has never had a colonoscopy  May be interested  Has been stressed   Plans to get back on meds --stopped due to financial problems  Patient Active Problem List  Diagnoses  . UNSPECIFIED VITAMIN D DEFICIENCY  . HYPERLIPIDEMIA  . DEPRESSIVE DISORDER  . POSTMENOPAUSAL BLEEDING  . OSTEOPOROSIS  . SCOLIOSIS  . FATIGUE   Past Medical History  Diagnosis Date  . Scoliosis   . Osteoporosis   . History of anemia   . ADD (attention deficit disorder with hyperactivity)     adult  . Depression   . Osteopenia 2002    dexa   Past Surgical History  Procedure Date  . Cesarean section     x 3  . Nasal sinus surgery   . Breast enhancement surgery   . Stress myoview normal 06/06  . Pelvic ultrasound 05/10    thickened endometrium and ? polyp vs fibroid   History  Substance Use Topics  . Smoking status: Former Research scientist (life sciences)  . Smokeless tobacco: Not on file  . Alcohol Use: Yes     occassionally   Family History  Problem Relation Age of Onset  . Heart disease Mother     CAD  . Alzheimer's disease Mother   . Heart disease Father      CAD MI at age 3  . Hypertension Sister   . Cancer Other     leukemia  . Diabetes Sister   . Cancer Other     breast   Allergies  Allergen Reactions  . Tetracycline Hcl    Current Outpatient Prescriptions on File Prior to Visit  Medication Sig Dispense Refill  . amphetamine-dextroamphetamine (ADDERALL) 10 MG tablet Take one by mouth each morning and one at lunchtime  60 tablet  0        Review of Systems Review of Systems  Constitutional: Negative for fever, appetite change, and unexpected weight change. pos for some fatigue from stress Eyes:  Negative for pain and visual disturbance.  Respiratory: Negative for cough and shortness of breath.   Cardiovascular: Negative for cp or palpitations    Gastrointestinal: Negative for nausea, and constipation. neg for abd pain/ cramping/ blood in stool and dark stool Genitourinary: Negative for urgency and frequency.  Skin: Negative for pallor or rash   Neurological: Negative for weakness, light-headedness, numbness and headaches.  Hematological: Negative for adenopathy. Does not bruise/bleed easily.  Psychiatric/Behavioral: Negative for dysphoric mood. The patient is somewhat anxious for stress           Objective:   Physical Exam  Constitutional: She appears well-developed and well-nourished. No distress.  HENT:  Head: Normocephalic and atraumatic.  Mouth/Throat: Oropharynx is clear and moist.  Eyes: Conjunctivae and EOM are normal. Pupils are equal, round, and reactive to light. No scleral icterus.  Neck: Normal range of motion. Neck supple. No JVD present. No thyromegaly present.  Cardiovascular: Normal rate, regular rhythm and normal heart sounds.   Pulmonary/Chest: Effort normal and breath sounds normal. She has no wheezes.  Abdominal: Soft. Bowel sounds are normal. She exhibits no distension and no mass.  There is no tenderness. There is no rebound and no guarding.  Musculoskeletal: She exhibits no edema.  Lymphadenopathy:    She has no cervical adenopathy.  Neurological: She is alert. She has normal reflexes. She displays no tremor.  Skin: Skin is warm and dry. No rash noted. No erythema. No pallor.       No jaundice  Psychiatric: She has a normal mood and affect.          Assessment & Plan:

## 2011-01-04 NOTE — Patient Instructions (Addendum)
I think you have some degree of IBS - irritable bowel syndome Get a fiber supplement like citrucel and take with water once daily as directed  Watch for foods that worsen symptoms and try to avoid them  Update me if worse or no improvement You are due for your first screening colonoscopy any time- so call if you want to schedule it  Flu shot today  If affordible- otherwise get it at the health dept

## 2011-02-15 ENCOUNTER — Other Ambulatory Visit: Payer: Self-pay | Admitting: Family Medicine

## 2011-02-15 MED ORDER — AMPHETAMINE-DEXTROAMPHETAMINE 10 MG PO TABS: ORAL_TABLET | ORAL | Status: DC

## 2011-02-15 NOTE — Telephone Encounter (Signed)
Pt called, need Rx for Adderall Call back # [phone]

## 2011-02-15 NOTE — Telephone Encounter (Deleted)
Pt request refill for Adderall ... Call back # [phone]

## 2011-02-15 NOTE — Telephone Encounter (Signed)
Patient advised Rx ready for pick up will be left at front desk.

## 2011-02-15 NOTE — Telephone Encounter (Signed)
Px printed for pick up in IN box  

## 2011-03-17 ENCOUNTER — Telehealth: Payer: Self-pay | Admitting: Family Medicine

## 2011-03-17 NOTE — Telephone Encounter (Signed)
[facility] Triage Call Report Triage Record Num: [id number] Operator: Soledad Gerlach Patient Name: Lisa Mclaughlin Call Date & Time: 03/17/2011 12:08:21PM Patient Phone: [phone] PCP: Patient Gender: Female PCP Fax : Patient DOB: Jun 20, 1950 Practice Name: [facility] Reason for Call: Caller: Vickii Penna; PCP: Loura Pardon A.; CB#: [phone]; ; ; Call regarding Pcp; Patient Will Not Discuss With RN But Wants Call Back/Appt With Dr. Glori Bickers; states patient is safe, but refuses ED or triage or discussing with RN. Info to office for MD review/callback. PLEASE CALL PATIENT BACK REGARDING "EMOTIONAL ISSUE" AND FURTHER DISCUSSION WITH DR. Glori Bickers, AS NO APPTS AVAILABLE. MAY REACH SPOUSE AT [phone]. Protocol(s) Used: Office Note Recommended Outcome per Protocol: Information Noted and Sent to Office Reason for Outcome: Caller information to office Care Advice: ~

## 2011-03-17 NOTE — Telephone Encounter (Signed)
Ok will see her then

## 2011-03-17 NOTE — Telephone Encounter (Signed)
Spoke w/ pts husband, scheduled at for Monday, March 18th.Marland Kitchen

## 2011-03-20 ENCOUNTER — Ambulatory Visit (INDEPENDENT_AMBULATORY_CARE_PROVIDER_SITE_OTHER): Payer: Self-pay | Admitting: Family Medicine

## 2011-03-20 ENCOUNTER — Encounter: Payer: Self-pay | Admitting: Family Medicine

## 2011-03-21 NOTE — Progress Notes (Signed)
  Subjective:    Patient ID: Lisa Mclaughlin, female    DOB: 1950/06/11, 61 y.o.   MRN: [id number]  HPI  appt cancelled  Review of Systems     Objective:   Physical Exam        Assessment & Plan:

## 2011-03-22 ENCOUNTER — Ambulatory Visit: Payer: Self-pay | Admitting: Family Medicine

## 2011-03-22 ENCOUNTER — Telehealth: Payer: Self-pay | Admitting: Family Medicine

## 2011-03-22 NOTE — Telephone Encounter (Signed)
Pt no showed for her appt today- I called her cell as well as her husband's to check on her Left messages on both to update me

## 2011-03-23 ENCOUNTER — Ambulatory Visit (INDEPENDENT_AMBULATORY_CARE_PROVIDER_SITE_OTHER): Payer: Self-pay | Admitting: Family Medicine

## 2011-03-23 ENCOUNTER — Encounter: Payer: Self-pay | Admitting: Family Medicine

## 2011-03-23 VITALS — BP 128/82 | HR 60 | Temp 97.5°F | Ht 62.0 in | Wt 140.2 lb

## 2011-03-23 DIAGNOSIS — F329 Major depressive disorder, single episode, unspecified: Secondary | ICD-10-CM

## 2011-03-23 DIAGNOSIS — F3289 Other specified depressive episodes: Secondary | ICD-10-CM

## 2011-03-23 MED ORDER — BUPROPION HCL ER (XL) 300 MG PO TB24: 300.0000 mg | ORAL_TABLET | Freq: Every day | ORAL | Status: DC

## 2011-03-23 MED ORDER — AMPHETAMINE-DEXTROAMPHETAMINE 10 MG PO TABS: ORAL_TABLET | ORAL | Status: DC

## 2011-03-23 NOTE — Patient Instructions (Signed)
Get back on your wellbutrin and adderall If any side effects let me know  If suddenly worse or any suicidal thoughts - please call/ get help asap or go to [facility] Keep me updated Follow up with me in about 2 months We will refer you to psychology at check out

## 2011-03-23 NOTE — Progress Notes (Signed)
Subjective:    Patient ID: Lisa Mclaughlin, female    DOB: August 30, 1950, 61 y.o.   MRN: [id number]  HPI A week ago had "an emotional meltdown"  Frightened she and her husband  Thinks she knows why it happened  Is already feeling better   Over the last few weeks - some stressors that caused increased anxiety  Tends to hide things well and put on a good show  Then it got a lot worse   Thinks that a traumatic experience as child - school yard bullying - that came to the surface  Also some strong family issues - and her family "smothers" her  Both parents have passed now  Also enourmous financial stressors  Being in [place] is difficult-where the trauma began   Husband noticed some forgetfulness in past 2 months  Then increasingly anxious = with defense mechanisms kicking in   ? If she had counseling in the past  Used to talk to her dad - no one else knew   Has not taken wellbutrin or other medicines  ? For a while  Does not have health insurance Will get it soon  In past felt better on wellbutrin and her adderall   Is without insurance but would be open to a counseling ref if she could afford to go  Patient Active Problem List  Diagnoses  . UNSPECIFIED VITAMIN D DEFICIENCY  . HYPERLIPIDEMIA  . DEPRESSIVE DISORDER  . POSTMENOPAUSAL BLEEDING  . OSTEOPOROSIS  . SCOLIOSIS  . FATIGUE  . IBS (irritable bowel syndrome)  . Erroneous encounter - disregard   Past Medical History  Diagnosis Date  . Scoliosis   . Osteoporosis   . History of anemia   . ADD (attention deficit disorder with hyperactivity)     adult  . Depression   . Osteopenia 2002    dexa   Past Surgical History  Procedure Date  . Cesarean section     x 3  . Nasal sinus surgery   . Breast enhancement surgery   . Stress myoview normal 06/06  . Pelvic ultrasound 05/10    thickened endometrium and ? polyp vs fibroid   History  Substance Use Topics  . Smoking status: Former Research scientist (life sciences)  . Smokeless tobacco: Not  on file  . Alcohol Use: Yes     occassionally   Family History  Problem Relation Age of Onset  . Heart disease Mother     CAD  . Alzheimer's disease Mother   . Heart disease Father      CAD MI at age 69  . Hypertension Father   . Hypertension Sister   . Cancer Other     leukemia  . Diabetes Sister   . Cancer Other     breast   Allergies  Allergen Reactions  . Tetracycline Hcl    Current Outpatient Prescriptions on File Prior to Visit  Medication Sig Dispense Refill  . alendronate (FOSAMAX) 70 MG tablet Take 70 mg by mouth every 7 (seven) days. Take with a full glass of water on an empty stomach.       Marland Kitchen aspirin 81 MG tablet Take 160 mg by mouth daily.        . Calcium Carbonate-Vitamin D (CALCIUM + D PO) Take by mouth daily.        . ergocalciferol (VITAMIN D2) 50000 UNITS capsule Take 50,000 Units by mouth once a week. X 10 weeks       . Multiple Vitamin (MULTIVITAMIN) tablet Take  1 tablet by mouth daily.        . rosuvastatin (CRESTOR) 10 MG tablet Take 10 mg by mouth daily.               Review of Systems Review of Systems  Constitutional: Negative for fever, appetite change, and unexpected weight change. pos for fatigue and loss of motivation Eyes: Negative for pain and visual disturbance.  Respiratory: Negative for cough and shortness of breath.   Cardiovascular: Negative for cp or palpitations    Gastrointestinal: Negative for nausea, diarrhea and constipation. neg for abd pain  Genitourinary: Negative for urgency and frequency.  Skin: Negative for pallor or rash   Neurological: Negative for weakness, light-headedness, numbness and headaches.  Hematological: Negative for adenopathy. Does not bruise/bleed easily.  Psychiatric/Behavioral: pos for dep and anxiety, neg for suicidal ideation, neg for OCD tendancies.          Objective:   Physical Exam  Constitutional: She is oriented to person, place, and time. She appears well-developed and well-nourished. No  distress.       Fatigued but well appearing   HENT:  Head: Normocephalic and atraumatic.  Mouth/Throat: Oropharynx is clear and moist.  Eyes: Conjunctivae and EOM are normal. Pupils are equal, round, and reactive to light. No scleral icterus.  Neck: Normal range of motion. Neck supple. No JVD present. No thyromegaly present.  Cardiovascular: Normal rate, regular rhythm, normal heart sounds and intact distal pulses.  Exam reveals no gallop.   No murmur heard. Pulmonary/Chest: Effort normal and breath sounds normal. No respiratory distress. She has no wheezes.  Abdominal: Soft. Bowel sounds are normal. She exhibits no distension and no mass. There is no tenderness.  Musculoskeletal: Normal range of motion. She exhibits no edema and no tenderness.  Lymphadenopathy:    She has no cervical adenopathy.  Neurological: She is alert and oriented to person, place, and time. She has normal reflexes. She displays no tremor. No cranial nerve deficit. She exhibits normal muscle tone. Coordination normal.  Skin: Skin is warm and dry. No rash noted. No erythema. No pallor.  Psychiatric: Her speech is normal and behavior is normal. Judgment and thought content normal. Her mood appears anxious. Her affect is blunt. Her affect is not labile and not inappropriate. She is not slowed and not withdrawn. Thought content is not delusional. Cognition and memory are normal. She exhibits a depressed mood. She expresses no homicidal and no suicidal ideation. She expresses no suicidal plans and no homicidal plans.       Pt discussed loss of memory but she is mentally sharp today  Not tearful Supportive husband present          Assessment & Plan:

## 2011-03-24 NOTE — Assessment & Plan Note (Signed)
Worse following move back to the area and recollection of past trauma Also severe financial stressors and no insurance (that should improve soon per husband) Hx of lifelong dep and anxiety with medicine in past  Feeling better now than last week Will refer her to affordable counselor  Also re start wellbutrin and adderall (for ADD as well)- with disc of side eff to watch for  Will f/u 2-3 mo  Disc coping skills / situational stressors and tx opt in detail  >25 min spent with face to face with patient, >50% counseling and/or coordinating care  will update if worse or any changes

## 2011-03-28 ENCOUNTER — Telehealth: Payer: Self-pay

## 2011-03-28 NOTE — Telephone Encounter (Signed)
Patient advised as instructed via telephone, she stated that she is feeling better but her husband and sister made her call.

## 2011-03-28 NOTE — Telephone Encounter (Signed)
Patient advised via telephone, she is taking the Wellbutrin and admitted that she just started it.  She has not started any counseling.

## 2011-03-28 NOTE — Telephone Encounter (Signed)
understandible- I want to keep a close eye on her

## 2011-03-28 NOTE — Telephone Encounter (Signed)
We just started her back on wellbutrin for both anxiety and depression - has not been very long for it to start working Please verify that she is taking that and will make further recommendation Also ask if she has started any counseling yet  thanks

## 2011-03-28 NOTE — Telephone Encounter (Signed)
Before we add more medicine- need to get wellbutrin into her system for at least a week - so keep me updated If she feels she is in crisis after hours I would want her to go to [place] to see  Behavioral health as we discussed at her visit Hopefully will start feeling better soon with the wellbutrin

## 2011-03-28 NOTE — Telephone Encounter (Signed)
Left message on cell phone voicemail for patient to return call. 

## 2011-03-28 NOTE — Telephone Encounter (Signed)
Pt said she saw Dr Glori Bickers on 03/23/11 and feels about the same as when seen. Pt said when she goes anywhere especially if there will be a crowd of people she gets very anxious. Pt can't remember is she has ever taken anti anxiety med. Pt requesting anti anxiety med called to [facility] in [place]. Pt can be reached at [phone].Please advise.

## 2011-04-21 ENCOUNTER — Telehealth: Payer: Self-pay | Admitting: Family Medicine

## 2011-04-21 NOTE — Telephone Encounter (Signed)
At the office visit with Dr Glori Bickers I gave the patient and her husband many phone numbers and names to call about her getting counseling. I have called Lisa Mclaughlin twice so far to ask if she has gotten anything lined up yet and she says no that she hasnt. I have offered to set her up with Dr Rexene Edison and have asked her to call Tia Masker about payment plan etc as they would probably let her do this. Have asked her to call me back to let you know when an appt has been made and as far as I know no appt has been scheduled yet.

## 2011-04-21 NOTE — Telephone Encounter (Signed)
Thanks for the effort so far! Keep me posted

## 2011-05-23 ENCOUNTER — Ambulatory Visit: Payer: Self-pay | Admitting: Family Medicine

## 2011-09-14 ENCOUNTER — Telehealth: Payer: Self-pay | Admitting: Family Medicine

## 2011-09-14 NOTE — Telephone Encounter (Signed)
Please change PCP in chart if he accepts pt

## 2011-09-14 NOTE — Telephone Encounter (Signed)
That is ok with me if ok with Dr Damita Dunnings

## 2011-09-14 NOTE — Telephone Encounter (Signed)
Okay with me. Please set up 24min OV in next 1-2 weeks if possible.  Thanks.

## 2011-09-14 NOTE — Telephone Encounter (Signed)
Lisa Mclaughlin wants to  Transfer from Dr. Glori Bickers to you. Is this ok with you? I sent a phone message to Dr. Glori Bickers.

## 2011-09-14 NOTE — Telephone Encounter (Signed)
Cherel wants to transfer to Dr. Damita Dunnings.

## 2011-09-18 NOTE — Telephone Encounter (Signed)
Left detailed message on VM to return call to schedule 30 min OV with Dr. Damita Dunnings sometime within the next few weeks or so.

## 2011-09-19 ENCOUNTER — Encounter: Payer: Self-pay | Admitting: Family Medicine

## 2011-09-19 ENCOUNTER — Telehealth: Payer: Self-pay

## 2011-09-19 ENCOUNTER — Ambulatory Visit (INDEPENDENT_AMBULATORY_CARE_PROVIDER_SITE_OTHER): Payer: BC Managed Care – PPO | Admitting: Family Medicine

## 2011-09-19 VITALS — BP 122/84 | HR 92 | Temp 97.5°F | Wt 134.0 lb

## 2011-09-19 DIAGNOSIS — R413 Other amnesia: Secondary | ICD-10-CM

## 2011-09-19 NOTE — Telephone Encounter (Signed)
Dr Pablo Ledger request call back prior to 3pm today re; needs to speak with Dr Damita Dunnings before he sees pt at 4pm today.

## 2011-09-19 NOTE — Telephone Encounter (Signed)
Dr Pablo Ledger left his cell # [phone].

## 2011-09-19 NOTE — Patient Instructions (Addendum)
We'll contact you with your lab report. I'll talk with Dr. Pablo Ledger in the meantime.  Take care.

## 2011-09-20 DIAGNOSIS — F0280 Dementia in other diseases classified elsewhere without behavioral disturbance: Secondary | ICD-10-CM | POA: Insufficient documentation

## 2011-09-20 LAB — CBC WITH DIFFERENTIAL/PLATELET
Basophils Relative: 0.6 % (ref 0.0–3.0)
Eosinophils Relative: 0.7 % (ref 0.0–5.0)
Lymphocytes Relative: 22.9 % (ref 12.0–46.0)
Neutrophils Relative %: 70.8 % (ref 43.0–77.0)
RBC: 4.88 Mil/uL (ref 3.87–5.11)
WBC: 8.8 10*3/uL (ref 4.5–10.5)

## 2011-09-20 LAB — COMPREHENSIVE METABOLIC PANEL
Albumin: 4.2 g/dL (ref 3.5–5.2)
BUN: 11 mg/dL (ref 6–23)
CO2: 26 mEq/L (ref 19–32)
Calcium: 9.7 mg/dL (ref 8.4–10.5)
Chloride: 106 mEq/L (ref 96–112)
Glucose, Bld: 90 mg/dL (ref 70–99)
Potassium: 4.3 mEq/L (ref 3.5–5.1)

## 2011-09-20 LAB — TSH: TSH: 0.49 u[IU]/mL (ref 0.35–5.50)

## 2011-09-20 LAB — VITAMIN B12: Vitamin B-12: 367 pg/mL (ref 211–911)

## 2011-09-20 NOTE — Assessment & Plan Note (Signed)
09/2011 MMSE 22/30 (6/10 orientation, 1/3 recall, 3/5 on calculation) Broad ddx- dementia, pseudodementia (due to sleep deprivation, anxiety, depression), ADD, metabolic disorders, hypothyroidism.  No illicit use.  Safe at home.  Okay for outpatient f/u.  Will check basic labs and will be in touch with Dr. McKnight/patient/her husband (she agrees with this).  >25 min spent with face to face with patient, >50% counseling and/or coordinating care.

## 2011-09-20 NOTE — Telephone Encounter (Signed)
I called him back and LMOVM.  I'll try again later.

## 2011-09-20 NOTE — Telephone Encounter (Signed)
I called Dr. Pablo Ledger again and LMOVM.

## 2011-09-20 NOTE — Progress Notes (Signed)
Married mother of 3 kids presents with husband.  Prev seen by Dr. Glori Bickers, currently seen by Dr. Pablo Ledger with psychology.  Overall history- sig upheaval in childhood.  Likely exacerbation of anxiety related to that childhood experience after moving back to [place].  Also with strained family relations, with her sister.  3/13- a "meltdown" with anxiety and insomnia/sleep deprivation.  Was seen by psychology and psychiatry briefly at that point.  Has now established with Dr. Pablo Ledger.    Was diagnosed with ADD as an adult.    Still with insomnia, fatigue, anxiety, and short term memory loss.  Memory changes noted over the last 2-3 years per patient and husband.    She's here to begin process of work up.  She is safe at home reportedly.  No Si/Hi, contracts for safety.    PMH and SH reviewed  ROS: See HPI, otherwise noncontributory.  Meds, vitals, and allergies reviewed.   GEN: nad, alert and oriented HEENT: mucous membranes moist NECK: supple w/o LA CV: rrr.  no murmur PULM: ctab, no inc wob ABD: soft, +bs EXT: no edema SKIN: no acute rash CN 2-12 wnl B, S/S/DTR wnl x4 MMSE 22/30 (6/10 orientation, 1/3 recall, 3/5 on calculation)

## 2011-09-21 ENCOUNTER — Telehealth: Payer: Self-pay | Admitting: Family Medicine

## 2011-09-21 NOTE — Telephone Encounter (Signed)
LMOVM of call back number. 

## 2011-09-21 NOTE — Telephone Encounter (Signed)
Caller: Randy Priore/Spouse; Phone: [phone]; Reason for Call: Husband calling to get test results, states patient had blood work done on Tuesday.  Please call him back.  Thanks

## 2011-09-22 NOTE — Telephone Encounter (Signed)
I called Dr. Pablo Ledger again and LMOVM.

## 2011-09-25 MED ORDER — CITALOPRAM HYDROBROMIDE 20 MG PO TABS: 20.0000 mg | ORAL_TABLET | Freq: Every day | ORAL | Status: DC

## 2011-09-25 NOTE — Telephone Encounter (Signed)
Left detailed message on husband's VM asking him to call in to schedule 6 weeks FU OV for 30 min and to signify that he has received this message and understands the instructions.

## 2011-09-25 NOTE — Telephone Encounter (Signed)
Please call pt/pt's husband.  I talked with Dr. Pablo Ledger.  We agreed that she could have mood changes affecting her memory.  I think it is reasonable to try to treat that first and he agrees.  I would keep taking the adderall and wellbutrin.  I would add on citalopram once a day (I sent the rx).  It usually takes about 6 weeks to notice the full benefit.  I would take it in the AM.  If it makes her drowsy, then take it at night instead (that may help with sleep).  If the insomnia continues, then I would get OTC melatonin and see if that helps her with sleep onset.  She is going to have f/u with McKnight in the meantime and I'd like to see her back in about 6 weeks, 30 min visit.

## 2011-09-26 NOTE — Telephone Encounter (Signed)
Patient's husband returned the call.  They are on vacation and he will schedule the 6 weeks follow up later in the week when they return home.

## 2011-10-09 ENCOUNTER — Other Ambulatory Visit: Payer: Self-pay

## 2011-10-09 MED ORDER — AMPHETAMINE-DEXTROAMPHETAMINE 10 MG PO TABS: ORAL_TABLET | ORAL | Status: DC

## 2011-10-09 NOTE — Telephone Encounter (Signed)
pts husband request rx adderall. Call when ready for pick up. (request to be picked up this afternoon if possible).

## 2011-10-09 NOTE — Telephone Encounter (Signed)
Patient's husband advised.  Rx left at front desk for pick up.

## 2011-10-09 NOTE — Telephone Encounter (Signed)
Printed.  Thanks.  

## 2011-10-18 ENCOUNTER — Other Ambulatory Visit: Payer: Self-pay | Admitting: Family Medicine

## 2011-10-18 MED ORDER — CITALOPRAM HYDROBROMIDE 20 MG PO TABS: 30.0000 mg | ORAL_TABLET | Freq: Every day | ORAL | Status: DC

## 2011-10-18 NOTE — Progress Notes (Signed)
D/w Dr. Pablo Ledger.  Pt has shown improvement.  Will inc SSRI to 30 then 40mg  and she'll f/u with me as scheduled.

## 2011-11-07 ENCOUNTER — Encounter: Payer: Self-pay | Admitting: Family Medicine

## 2011-11-07 ENCOUNTER — Ambulatory Visit (INDEPENDENT_AMBULATORY_CARE_PROVIDER_SITE_OTHER): Payer: BC Managed Care – PPO | Admitting: Family Medicine

## 2011-11-07 VITALS — BP 138/82 | HR 88 | Temp 97.7°F | Wt 131.0 lb

## 2011-11-07 DIAGNOSIS — Z23 Encounter for immunization: Secondary | ICD-10-CM

## 2011-11-07 DIAGNOSIS — R413 Other amnesia: Secondary | ICD-10-CM

## 2011-11-07 MED ORDER — CITALOPRAM HYDROBROMIDE 40 MG PO TABS: 40.0000 mg | ORAL_TABLET | Freq: Every day | ORAL | Status: DC

## 2011-11-07 MED ORDER — AMPHETAMINE-DEXTROAMPHETAMINE 10 MG PO TABS: ORAL_TABLET | ORAL | Status: DC

## 2011-11-07 MED ORDER — BUPROPION HCL ER (XL) 300 MG PO TB24: 300.0000 mg | ORAL_TABLET | Freq: Every day | ORAL | Status: DC

## 2011-11-07 NOTE — Patient Instructions (Addendum)
Recheck in 2 months.  30 min visit.  Increase to 40mg  of citalopram a day.  Take care.  Glad to see you.

## 2011-11-08 NOTE — Progress Notes (Signed)
F/u for memory and mood changes.  Less anxiety and panic sx with inc in citalopram.  Sleep is some better, taking melatonin.  Still with late bedtime and occ disordered sleep times, but some improvement.  More energy, less memory troubles.  Pt and husband have noted the changes.  No SI/HI.  Still with some memory changes, possibly due to inattention (known h/o ADD).  Mild B hand tremor on adderall but o/w tolerated well.  Still in counseling.  Rare xanax use.  Husband supportive.    Meds, vitals, and allergies reviewed.   ROS: See HPI.  Otherwise, noncontributory.  GEN: nad, alert and oriented HEENT: mucous membranes moist NECK: supple w/o LA CV: rrr. PULM: ctab, no inc wob ABD: soft, +bs EXT: no edema SKIN: no acute rash CN 2-12 wnl B, S/S/DTR wnl x4, faint B hand tremor noted intermittently.   MMSE 26/30 (-2 orientation- improved, -2 recall- no change, no deficit with attention- improved)

## 2011-11-08 NOTE — Assessment & Plan Note (Addendum)
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  Mood is improved.  Continue current meds, no change except to inc SSRI to 40mg  a day.  Would not inc adderall with the faint tremor noted.  Continue to work on sleep routine as this should help.  Okay for outpatient f/u.  No si/hi.  Safe at home.  She agrees with plan. i don't think this represents true dementia.  Will hold of on neuroimaging given the improvement.  MMSE 26/30 (-2 orientation- improved, -2 recall- no change, no deficit with attention- improved) >25 min spent with face to face with patient, >50% counseling and/or coordinating care  Recheck 2 months.

## 2011-11-23 ENCOUNTER — Encounter: Payer: Self-pay | Admitting: Family Medicine

## 2011-11-23 ENCOUNTER — Ambulatory Visit (INDEPENDENT_AMBULATORY_CARE_PROVIDER_SITE_OTHER): Payer: BC Managed Care – PPO | Admitting: Family Medicine

## 2011-11-23 VITALS — BP 114/78 | HR 88 | Temp 97.9°F | Wt 130.0 lb

## 2011-11-23 DIAGNOSIS — I951 Orthostatic hypotension: Secondary | ICD-10-CM

## 2011-11-23 NOTE — Progress Notes (Signed)
When she stands, she'll have to grab onto a wall/counter.  Brief episodes, self resolving.  She feels presyncopal but not fully syncopal.  Going on (and gradually lworse in) the last 1-2 weeks.  Only med change is the inc in celexa at the last OV.  She still has some anxiety episodes.  She isn't drinking a lot of water.  No CP during the episodes.  No exertional sx o/w, once the episode resolves.  Doesn't happen when rolling over in bed, standing to sitting; only sitting to standing.  Appetite is low; this is long standing.  She drinks more soda than anything else.   Meds, vitals, and allergies reviewed.   ROS: See HPI.  Otherwise, noncontributory.  nad ncat Mmm Neck supple, no LA, no bruit rrr ctab Ext w/o edema CN 2-12 wnl B, S/S/DTR wnl x4 No vertigo on eye tracking TMs wnl B Mildly and briefly orthostatic on standing.

## 2011-11-23 NOTE — Patient Instructions (Addendum)
Drink 6 glasses of water a day and cut out soda.   Take care.  Glad to see you.

## 2011-11-24 DIAGNOSIS — I951 Orthostatic hypotension: Secondary | ICD-10-CM | POA: Insufficient documentation

## 2011-11-24 NOTE — Assessment & Plan Note (Signed)
Brief, mild, self resolving.  Limited fluid intake likely related.  Inc to 6 glasses of water a day, cut out soda, f/u prn.  Doesn't appear cardiac in nature.  No true vertigo.

## 2011-12-11 ENCOUNTER — Telehealth: Payer: Self-pay | Admitting: Family Medicine

## 2011-12-11 MED ORDER — AMPHETAMINE-DEXTROAMPHET ER 20 MG PO CP24: 20.0000 mg | ORAL_CAPSULE | ORAL | Status: DC

## 2011-12-11 NOTE — Telephone Encounter (Signed)
Patient advised.  Rx picked up by patient.

## 2011-12-11 NOTE — Telephone Encounter (Signed)
Call from Dr. Pablo Ledger.  Mood is improved with depression/anxiety.  She has noted improvement right after adderall dose.  Agreed to change to XR 20mg  a day.

## 2012-01-08 ENCOUNTER — Ambulatory Visit (INDEPENDENT_AMBULATORY_CARE_PROVIDER_SITE_OTHER): Payer: BC Managed Care – PPO | Admitting: Family Medicine

## 2012-01-08 ENCOUNTER — Encounter: Payer: Self-pay | Admitting: Family Medicine

## 2012-01-08 VITALS — BP 138/80 | HR 92 | Temp 98.1°F | Wt 131.8 lb

## 2012-01-08 DIAGNOSIS — R413 Other amnesia: Secondary | ICD-10-CM

## 2012-01-08 DIAGNOSIS — F418 Other specified anxiety disorders: Secondary | ICD-10-CM

## 2012-01-08 DIAGNOSIS — F341 Dysthymic disorder: Secondary | ICD-10-CM

## 2012-01-08 DIAGNOSIS — I951 Orthostatic hypotension: Secondary | ICD-10-CM

## 2012-01-08 MED ORDER — AMPHETAMINE-DEXTROAMPHET ER 20 MG PO CP24: 20.0000 mg | ORAL_CAPSULE | ORAL | Status: DC

## 2012-01-08 NOTE — Patient Instructions (Addendum)
Gradually cut back on caffeine, drink more water and try to get some exercise.  Don't change your meds.  Take care.

## 2012-01-09 NOTE — Assessment & Plan Note (Addendum)
Encouraged to drink more water and less caffeine.  We can w/u if sx persist after that but with normal exam I wouldn't pursue now.>25 min spent with face to face with patient, >50% counseling and/or coordinating care overall.

## 2012-01-09 NOTE — Assessment & Plan Note (Signed)
Continue with counseling and current meds.  Okay for outpatient f/u.  Recheck this summer, sooner prn. She agrees.

## 2012-01-09 NOTE — Assessment & Plan Note (Signed)
Continue adderall for now. MMSE 22/30 (-4 orientation, -3 recall, no deficit with attention, -1 on copying). Noted that this is at the end of the day when the adderall is waning and this may play a role in her score today.  She is still overall improved. Per report.

## 2012-01-09 NOTE — Progress Notes (Signed)
Here for depression/anxiety/insomnia memory f/u.  Continued on meds since last OV.  Sleep is improved, she is rested in AM.  Mood is improved, less anxious and overall much improved "from where I started."  Husband agrees.  Still with occ lightheaded sx on standing.  She isn't drinking much water and is drinking mult caffinated cokes a day.  Prev discussed.  Still with mild tremor noted.  Tolerating adderall o/w.  Memory is still a problem but  Still improved "from where I started."    Meds, vitals, and allergies reviewed.   ROS: See HPI.  Otherwise, noncontributory.  GEN: nad, alert and oriented  HEENT: mucous membranes moist  NECK: supple w/o LA  CV: rrr.  No murmur with standing, sitting PULM: ctab, no inc wob  ABD: soft, +bs  EXT: no edema  SKIN: no acute rash  CN 2-12 wnl B, S/S/DTR wnl x4, faint B hand tremor noted intermittently.  MMSE 22/30 (-4 orientation, -3 recall, no deficit with attention, -1 on copying). Noted that this is at the end of the day when the adderall is waning and this may play a role in her score today.

## 2012-02-13 ENCOUNTER — Telehealth: Payer: Self-pay | Admitting: Family Medicine

## 2012-02-13 MED ORDER — AMPHETAMINE-DEXTROAMPHET ER 20 MG PO CP24: 20.0000 mg | ORAL_CAPSULE | ORAL | Status: DC

## 2012-02-13 MED ORDER — AMPHETAMINE-DEXTROAMPHETAMINE 10 MG PO TABS: ORAL_TABLET | ORAL | Status: DC

## 2012-02-13 NOTE — Telephone Encounter (Signed)
I talked with Dr. Pablo Ledger and her memory is improved per report.  She isn't fully back to baseline.  We talked about options.  The adderall tends to wear off in mid afternoon.  Will add on plain adderall at that point.  She'll continue f/u with McKnight.  If she doesn't continue to improve, the neuropsych testing/referral and/or CT head would be reasonable to consider.    Rx printed, please put up front.

## 2012-02-13 NOTE — Telephone Encounter (Signed)
Rx's left at front desk for pick up.

## 2012-02-13 NOTE — Telephone Encounter (Signed)
I spoke with Mr. Bares to let him know that this Rx was ready.  I wasn't sure if he was already advised or not and he said she also needs the Rx for the Extended Release.  Can we add that to the envelope for pick up?

## 2012-02-13 NOTE — Telephone Encounter (Signed)
Printed.  Thanks.  

## 2012-03-18 ENCOUNTER — Other Ambulatory Visit: Payer: Self-pay

## 2012-03-18 MED ORDER — AMPHETAMINE-DEXTROAMPHETAMINE 10 MG PO TABS: ORAL_TABLET | ORAL | Status: DC

## 2012-03-18 MED ORDER — AMPHETAMINE-DEXTROAMPHET ER 20 MG PO CP24: 20.0000 mg | ORAL_CAPSULE | ORAL | Status: DC

## 2012-03-18 NOTE — Telephone Encounter (Signed)
pts husband left v/m requesting rx for adderall xr 20 mg and 10 mg. Call when ready for pick up.

## 2012-03-18 NOTE — Telephone Encounter (Signed)
Printed, please give to patient.  

## 2012-03-18 NOTE — Telephone Encounter (Signed)
Husband advised.  Rx left at front desk for pick up.  

## 2012-04-18 ENCOUNTER — Other Ambulatory Visit: Payer: Self-pay | Admitting: *Deleted

## 2012-04-18 MED ORDER — AMPHETAMINE-DEXTROAMPHET ER 20 MG PO CP24: 20.0000 mg | ORAL_CAPSULE | ORAL | Status: DC

## 2012-04-18 MED ORDER — AMPHETAMINE-DEXTROAMPHETAMINE 10 MG PO TABS: ORAL_TABLET | ORAL | Status: DC

## 2012-04-18 NOTE — Telephone Encounter (Signed)
Printed and placed in kims' box.

## 2012-04-18 NOTE — Telephone Encounter (Signed)
Patient's husband notified and Rx placed up front for pick up. 

## 2012-04-18 NOTE — Telephone Encounter (Signed)
Patient is almost out of medication and wants to know if she can pick this up this after noon. Dr. Damita Dunnings is out of the office until Tuesday. Can you do this?

## 2012-05-17 ENCOUNTER — Other Ambulatory Visit: Payer: Self-pay

## 2012-05-17 NOTE — Telephone Encounter (Signed)
pts husband request rx adderall xr and allerall 10 mg. Call when ready for pick up.

## 2012-05-19 MED ORDER — AMPHETAMINE-DEXTROAMPHETAMINE 10 MG PO TABS: ORAL_TABLET | ORAL | Status: DC

## 2012-05-19 MED ORDER — AMPHETAMINE-DEXTROAMPHET ER 20 MG PO CP24: 20.0000 mg | ORAL_CAPSULE | ORAL | Status: DC

## 2012-05-19 NOTE — Telephone Encounter (Signed)
Printed

## 2012-05-20 ENCOUNTER — Encounter: Payer: Self-pay | Admitting: Family Medicine

## 2012-05-20 ENCOUNTER — Other Ambulatory Visit: Payer: Self-pay | Admitting: *Deleted

## 2012-05-20 NOTE — Telephone Encounter (Signed)
Requested Friday, available today.  Given to LF.  Needs at least 1 business day on controlled meds.

## 2012-05-20 NOTE — Telephone Encounter (Signed)
Husband advised.  Rx's placed at front desk for pick up.

## 2012-05-20 NOTE — Telephone Encounter (Signed)
Removed second request.  Rx's ready for pick up.  Husband notified.

## 2012-05-20 NOTE — Telephone Encounter (Signed)
Husband requesting Adderall Refill.  Says it was requested previously last week but I do not have any record of the request. Please call husband cell number when it is ready for pick up.

## 2012-05-20 NOTE — Telephone Encounter (Signed)
[phone] husband's cell.

## 2012-05-23 ENCOUNTER — Telehealth: Payer: Self-pay | Admitting: Family Medicine

## 2012-05-23 MED ORDER — ARIPIPRAZOLE 5 MG PO TABS: 5.0000 mg | ORAL_TABLET | Freq: Every day | ORAL | Status: DC

## 2012-05-23 NOTE — Telephone Encounter (Signed)
Call from Dr. Pablo Ledger.  Memory has gotten worse in the last 3 months.  She has been sundowning some in the afternoon, with irritability.  She has had some paranoid/obessive thoughts about housing, buying a house.  She is worried/concerned that her sister is acting against her.  Husband has been looking after the patient.   We talked about options.  I asked them to have her come in tomorrow at 12:15.  We can work on labs/imaging at that point.  I would add on abilify in the meantime.  rx sent.    [place], please add her on the schedule.  Thanks.

## 2012-05-23 NOTE — Telephone Encounter (Signed)
Appointment scheduled.

## 2012-05-24 ENCOUNTER — Encounter: Payer: Self-pay | Admitting: Family Medicine

## 2012-05-24 ENCOUNTER — Ambulatory Visit (INDEPENDENT_AMBULATORY_CARE_PROVIDER_SITE_OTHER): Payer: Managed Care, Other (non HMO) | Admitting: Family Medicine

## 2012-05-24 VITALS — BP 122/80 | HR 87 | Temp 98.0°F | Wt 139.5 lb

## 2012-05-24 DIAGNOSIS — R413 Other amnesia: Secondary | ICD-10-CM

## 2012-05-24 DIAGNOSIS — R232 Flushing: Secondary | ICD-10-CM

## 2012-05-24 DIAGNOSIS — N951 Menopausal and female climacteric states: Secondary | ICD-10-CM

## 2012-05-24 LAB — FOLLICLE STIMULATING HORMONE: FSH: 86.6 m[IU]/mL

## 2012-05-24 LAB — CBC WITH DIFFERENTIAL/PLATELET
Basophils Absolute: 0 10*3/uL (ref 0.0–0.1)
Basophils Relative: 0.4 % (ref 0.0–3.0)
Eosinophils Absolute: 0.1 10*3/uL (ref 0.0–0.7)
Hemoglobin: 14.3 g/dL (ref 12.0–15.0)
Lymphocytes Relative: 23.5 % (ref 12.0–46.0)
Monocytes Relative: 6.8 % (ref 3.0–12.0)
Neutro Abs: 5.3 10*3/uL (ref 1.4–7.7)
Neutrophils Relative %: 68.5 % (ref 43.0–77.0)
RBC: 4.67 Mil/uL (ref 3.87–5.11)

## 2012-05-24 LAB — COMPREHENSIVE METABOLIC PANEL
Albumin: 3.9 g/dL (ref 3.5–5.2)
BUN: 13 mg/dL (ref 6–23)
CO2: 27 mEq/L (ref 19–32)
Calcium: 9.6 mg/dL (ref 8.4–10.5)
Chloride: 106 mEq/L (ref 96–112)
Creatinine, Ser: 0.8 mg/dL (ref 0.4–1.2)
GFR: 73.97 mL/min (ref >60.00)
Glucose, Bld: 99 mg/dL (ref 70–99)
Potassium: 3.7 mEq/L (ref 3.5–5.1)

## 2012-05-24 LAB — VITAMIN B12: Vitamin B-12: 622 pg/mL (ref 211–911)

## 2012-05-24 NOTE — Progress Notes (Signed)
Pt here with husband.  Hx per patient and husband.  She was prev improved but then her memory has gotten worse in the last 3 months and she has been fatigued. She has been sundowning some in the afternoon, with irritability noted by her husband.  She has had some paranoid/obessive thoughts about housing, buying a house. She is worried/concerned that her sister is acting against her. Husband has been looking after the patient at home.  No SI/HI.    It seems like the sx started around Easter, around the time of her sister's visit. It is unclear if this triggered any of her symptoms. They had minimal contact in the months preceding that encounter.    She just started ability and the CBC is pending. It makes her drowsy but she hasn't noted other changes on the medicine.   She has had some lightheaded episodes.  Discussed prev about adequate fluid intake.    Melatonin helped with the insomnia.  She's had hot flashes persistently.  Meds, vitals, and allergies reviewed.   ROS: See HPI.  Otherwise, noncontributory.  GEN: nad, alert but not oriented.  2/3 on recall, 5/5 DLROW HEENT: mucous membranes moist NECK: supple w/o LA CV: rrr PULM: ctab, no inc wob ABD: soft, +bs EXT: no edema SKIN: no acute rash CN 2-12 wnl B, S/S/DTR wnl x4

## 2012-05-24 NOTE — Assessment & Plan Note (Signed)
She wanted to get set up with gyn for baseline and this is reasonable.  I would like gyn input on hot flash treatment.  I would like to avoid HRT if possible.

## 2012-05-24 NOTE — Assessment & Plan Note (Signed)
Worsening, unclear if social/family stressor played a role.  Check listed labs and CT head.  Okay for outpatient f/u.  I would continue the abilify for now, check ANC on the CBC.  We may need to increase this.  We'll see about her response clinically.  She agrees, as does husband.

## 2012-05-24 NOTE — Patient Instructions (Addendum)
Go to the lab on the way out.  We'll contact you with your lab report.  See Rosaria Ferries about your referral before you leave today (CT and Dr. Kennith [facility] clinic). Take care.  Don't change your meds for now.

## 2012-05-30 ENCOUNTER — Encounter: Payer: Self-pay | Admitting: Family Medicine

## 2012-06-05 ENCOUNTER — Ambulatory Visit (INDEPENDENT_AMBULATORY_CARE_PROVIDER_SITE_OTHER)
Admission: RE | Admit: 2012-06-05 | Discharge: 2012-06-05 | Disposition: A | Discharge status: Home / Self Care | Payer: Managed Care, Other (non HMO) | Source: Ambulatory Visit | Attending: Family Medicine | Admitting: Family Medicine

## 2012-06-05 DIAGNOSIS — R413 Other amnesia: Secondary | ICD-10-CM

## 2012-06-11 LAB — ESTRADIOL, FREE
Estradiol, Free: 0.09 pg/mL
Estradiol: 6 pg/mL

## 2012-06-17 ENCOUNTER — Other Ambulatory Visit: Payer: Self-pay

## 2012-06-17 NOTE — Telephone Encounter (Signed)
pts husband left v/m requesting rx adderall XR and adderall 10 mg. Call when rx ready for pick up.

## 2012-06-18 MED ORDER — AMPHETAMINE-DEXTROAMPHET ER 20 MG PO CP24: 20.0000 mg | ORAL_CAPSULE | ORAL | Status: DC

## 2012-06-18 MED ORDER — AMPHETAMINE-DEXTROAMPHETAMINE 10 MG PO TABS: ORAL_TABLET | ORAL | Status: DC

## 2012-06-18 NOTE — Telephone Encounter (Signed)
Left message on machine that rx is ready for pick-up, and it will be at our front desk.  

## 2012-06-18 NOTE — Telephone Encounter (Signed)
Printed.  I'll sign when I come to clinic.

## 2012-06-24 ENCOUNTER — Telehealth: Payer: Self-pay | Admitting: Family Medicine

## 2012-06-24 NOTE — Telephone Encounter (Signed)
I called back.  Lisa Mclaughlin is unavailable and I'll talk with him tomorrow.  I agree with neuropsychologist referral and I'll talk to him about it.

## 2012-06-24 NOTE — Telephone Encounter (Signed)
Dr. Pablo Ledger would like to speak w/you in ref to pt. He would like to refer her to a neuro-psychologist but wants to make sure it's ok w/you first. Could you please call him back at your earliest convenience? Thank you.

## 2012-06-25 NOTE — Telephone Encounter (Signed)
Message given back and forth through [facility].  Agreed with neuropsychologist referral and I'll await the result of the consult.

## 2012-07-08 ENCOUNTER — Encounter: Payer: Self-pay | Admitting: Family Medicine

## 2012-07-08 ENCOUNTER — Ambulatory Visit (INDEPENDENT_AMBULATORY_CARE_PROVIDER_SITE_OTHER): Payer: Managed Care, Other (non HMO) | Admitting: Family Medicine

## 2012-07-08 VITALS — BP 140/72 | HR 87 | Temp 98.0°F | Wt 143.0 lb

## 2012-07-08 DIAGNOSIS — Z79899 Other long term (current) drug therapy: Secondary | ICD-10-CM

## 2012-07-08 DIAGNOSIS — R413 Other amnesia: Secondary | ICD-10-CM

## 2012-07-08 DIAGNOSIS — F418 Other specified anxiety disorders: Secondary | ICD-10-CM

## 2012-07-08 DIAGNOSIS — F341 Dysthymic disorder: Secondary | ICD-10-CM

## 2012-07-08 MED ORDER — ARIPIPRAZOLE 5 MG PO TABS: 5.0000 mg | ORAL_TABLET | Freq: Every day | ORAL | Status: DC

## 2012-07-08 MED ORDER — AMPHETAMINE-DEXTROAMPHETAMINE 10 MG PO TABS: ORAL_TABLET | ORAL | Status: DC

## 2012-07-08 MED ORDER — AMPHETAMINE-DEXTROAMPHET ER 20 MG PO CP24: 20.0000 mg | ORAL_CAPSULE | ORAL | Status: DC

## 2012-07-08 NOTE — Progress Notes (Signed)
Patient's husband says the patient is now on a hormone patch from OB/GYN but does not recall the name of the patch.  He states he will call back with the information if needed.  Mike Craze, CMA  HRT per [facility].  they'll check on the dose/med.  DXA and mammogram pending per gyn.   Since starting abilify, her mood is improved, less worried, more energy (has been cleaning the house some, this is a change), sleeping better.  She feels better.  No BZD use and much less anxiety.  No tremor.  Her memory troubles continue, with short term memory affected.  Neuropsych testing is pending.    Meds, vitals, and allergies reviewed.   ROS: See HPI.  Otherwise, noncontributory.  GEN: nad, alert, no tremor, speech fluent and normal HEENT: mucous membranes moist NECK: supple w/o LA CV: rrr.  no murmur PULM: ctab, no inc wob ABD: soft, +bs EXT: no edema 0/3 on recall.

## 2012-07-08 NOTE — Assessment & Plan Note (Signed)
Unclear how much is from attention, mood ie pseudodementia, and true primary memory troubles.  No changes in meds, will await neuropsych testing.  >25 min spent with face to face with patient, >50% counseling and/or coordinating care.

## 2012-07-08 NOTE — Assessment & Plan Note (Signed)
Improved with abilify added on, check CBC again today.

## 2012-07-08 NOTE — Patient Instructions (Addendum)
Go to the lab on the way out.  We'll contact you with your lab report. I'll await the neuropsychological testing.  Don't change your meds for now.  Take care.  Glad to see you.

## 2012-07-09 LAB — CBC WITH DIFFERENTIAL/PLATELET
Eosinophils Relative: 1.1 % (ref 0.0–5.0)
HCT: 39 % (ref 36.0–46.0)
Lymphs Abs: 1.6 10*3/uL (ref 0.7–4.0)
MCV: 91.9 fl (ref 78.0–100.0)
Monocytes Absolute: 0.6 10*3/uL (ref 0.1–1.0)
Neutro Abs: 5.3 10*3/uL (ref 1.4–7.7)
Platelets: 292 10*3/uL (ref 150.0–400.0)
WBC: 7.5 10*3/uL (ref 4.5–10.5)

## 2012-07-22 ENCOUNTER — Other Ambulatory Visit: Payer: Self-pay | Admitting: Family Medicine

## 2012-07-22 MED ORDER — AMPHETAMINE-DEXTROAMPHETAMINE 10 MG PO TABS: ORAL_TABLET | ORAL | Status: DC

## 2012-07-22 MED ORDER — AMPHETAMINE-DEXTROAMPHET ER 20 MG PO CP24: 20.0000 mg | ORAL_CAPSULE | ORAL | Status: DC

## 2012-07-22 NOTE — Telephone Encounter (Signed)
Lisa Mclaughlin request rx Adderall XR 20 mg and adderall 10 mg; Lisa Mclaughlin said misplaced prescriptions that were picked up in July.Please advise.

## 2012-07-22 NOTE — Telephone Encounter (Signed)
Husband advised.  Rx left at front desk for pick up.  

## 2012-07-22 NOTE — Telephone Encounter (Signed)
All printed.  They need to keep these rxs on hand or drop them immediately at the pharmacy when they pick them up (if the pharmacy will hold them).  Thanks.

## 2012-07-30 ENCOUNTER — Encounter: Payer: Self-pay | Admitting: Family Medicine

## 2012-08-09 DIAGNOSIS — R413 Other amnesia: Secondary | ICD-10-CM

## 2012-08-09 DIAGNOSIS — F329 Major depressive disorder, single episode, unspecified: Secondary | ICD-10-CM

## 2012-09-05 ENCOUNTER — Other Ambulatory Visit: Payer: Self-pay

## 2012-09-05 DIAGNOSIS — R413 Other amnesia: Secondary | ICD-10-CM

## 2012-09-05 MED ORDER — AMPHETAMINE-DEXTROAMPHET ER 20 MG PO CP24: 20.0000 mg | ORAL_CAPSULE | ORAL | Status: DC

## 2012-09-05 MED ORDER — AMPHETAMINE-DEXTROAMPHETAMINE 10 MG PO TABS: ORAL_TABLET | ORAL | Status: DC

## 2012-09-05 MED ORDER — ARIPIPRAZOLE 5 MG PO TABS: ORAL_TABLET | ORAL | Status: DC

## 2012-09-05 NOTE — Telephone Encounter (Signed)
Mr Lisa Mclaughlin request refill adderall xr 20 mg and adderall 10 mg and abilify. Pt request call back when rx ready for pick up. Pt needs meds on 09/06/12.

## 2012-09-05 NOTE — Telephone Encounter (Signed)
Printed.  Thanks.  

## 2012-09-06 NOTE — Telephone Encounter (Signed)
Advise Lisa Mclaughlin Rx ready for pick-up

## 2012-09-16 ENCOUNTER — Encounter: Payer: Self-pay | Admitting: Family Medicine

## 2012-09-20 ENCOUNTER — Ambulatory Visit (INDEPENDENT_AMBULATORY_CARE_PROVIDER_SITE_OTHER): Payer: Managed Care, Other (non HMO) | Admitting: Family Medicine

## 2012-09-20 ENCOUNTER — Encounter: Payer: Self-pay | Admitting: Family Medicine

## 2012-09-20 VITALS — BP 128/80 | HR 85 | Temp 97.7°F | Wt 157.5 lb

## 2012-09-20 DIAGNOSIS — R413 Other amnesia: Secondary | ICD-10-CM

## 2012-09-20 DIAGNOSIS — F0390 Unspecified dementia without behavioral disturbance: Secondary | ICD-10-CM

## 2012-09-20 NOTE — Patient Instructions (Addendum)
Let me know about the name and strength of the estrogen patch.  Call [facility] and explain your situation.  [phone].  I would check into support groups.  Rosaria Ferries will call about your referral. Take care.  Don't change your meds.

## 2012-09-20 NOTE — Progress Notes (Signed)
Pt on HRT patch per gyn and they'll update me on that.   F/u for memory testing.  Progressive memory loss. FH dementia noted.  Also with likely ADD and depression, both treated and improved.  Still with memory difficulties and sent for neurocog testing.  Likely with dementia at this point, presumed AD.  No other clear marker for another process, ie tremor for parkinson's or CVA on CT.  Prev labs reviewed.  Has supportive husband.  Discussed options today.    Meds, vitals, and allergies reviewed.   ROS: See HPI.  Otherwise, noncontributory.  nad ncat Mmm rrr ctab No tremor Speech wnl, pleasant but recall limited Memory of recent testing limited- she doesn't recall much of that process.

## 2012-09-22 NOTE — Assessment & Plan Note (Signed)
Progressive memory loss. FH dementia noted.  Also with likely ADD and depression, both treated and improved.  Still with memory difficulties and sent for neurocog testing.  Likely with dementia at this point, presumed AD.  No other clear marker for another process, ie tremor for parkinson's or CVA on CT.  Prev labs reviewed.  Has supportive husband.  Discussed options today.  Would hold off on meds for now and get specialty referral for neuro.  We can go from there.  Offered support for pt and husband, encouraged support group for him.  App neuro help.  I would presume either aricept vs namenda would be reasonable here, but I'd like neuro input.  >25 min spent with face to face with patient, >50% counseling and/or coordinating care.

## 2012-09-23 ENCOUNTER — Encounter: Payer: Self-pay | Admitting: Family Medicine

## 2012-09-26 ENCOUNTER — Ambulatory Visit (INDEPENDENT_AMBULATORY_CARE_PROVIDER_SITE_OTHER): Payer: Managed Care, Other (non HMO) | Admitting: Neurology

## 2012-09-26 ENCOUNTER — Encounter: Payer: Self-pay | Admitting: Neurology

## 2012-09-26 VITALS — BP 122/80 | HR 88 | Temp 98.0°F | Resp 20 | Ht 62.0 in | Wt 159.0 lb

## 2012-09-26 DIAGNOSIS — R413 Other amnesia: Secondary | ICD-10-CM

## 2012-09-26 MED ORDER — DONEPEZIL HCL 5 MG PO TABS: 5.0000 mg | ORAL_TABLET | Freq: Every day | ORAL | Status: DC

## 2012-09-26 NOTE — Patient Instructions (Addendum)
Your MRI is scheduled for Tuesday, Sept 30 at 9:00am at [facility]
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
## 2012-09-30 LAB — VITAMIN E: Vitamin E (Alpha Tocopherol): 31 mg/L — ABNORMAL HIGH (ref 5.7–19.9)

## 2012-10-01 ENCOUNTER — Ambulatory Visit
Admission: RE | Admit: 2012-10-01 | Discharge: 2012-10-01 | Disposition: A | Discharge status: Home / Self Care | Payer: Managed Care, Other (non HMO) | Source: Ambulatory Visit | Attending: Neurology | Admitting: Neurology

## 2012-10-01 DIAGNOSIS — R413 Other amnesia: Secondary | ICD-10-CM

## 2012-10-02 ENCOUNTER — Other Ambulatory Visit: Payer: Self-pay

## 2012-10-02 MED ORDER — AMPHETAMINE-DEXTROAMPHET ER 20 MG PO CP24: 20.0000 mg | ORAL_CAPSULE | ORAL | Status: DC

## 2012-10-02 MED ORDER — AMPHETAMINE-DEXTROAMPHETAMINE 10 MG PO TABS: ORAL_TABLET | ORAL | Status: DC

## 2012-10-02 NOTE — Telephone Encounter (Signed)
Printed

## 2012-10-02 NOTE — Telephone Encounter (Signed)
Louie Casa pts husband left v/m requesting rx adderall xr and adderall 10 mg. Call when ready for pick up.

## 2012-10-03 NOTE — Telephone Encounter (Signed)
Husband advised.  Rx left at front desk for pick up.

## 2012-10-04 ENCOUNTER — Telehealth: Payer: Self-pay | Admitting: Neurology

## 2012-10-04 NOTE — Telephone Encounter (Signed)
Results of vitamin E and MRI brain discussed with patient's husband.  All questions were answered.  Lisa Mclaughlin K. Posey Pronto, DO

## 2012-10-29 ENCOUNTER — Encounter: Payer: Self-pay | Admitting: Neurology

## 2012-10-29 ENCOUNTER — Ambulatory Visit (INDEPENDENT_AMBULATORY_CARE_PROVIDER_SITE_OTHER): Payer: Managed Care, Other (non HMO) | Admitting: Neurology

## 2012-10-29 VITALS — BP 132/88 | HR 76 | Temp 97.7°F | Resp 12 | Ht 62.0 in | Wt 160.1 lb

## 2012-10-29 DIAGNOSIS — R413 Other amnesia: Secondary | ICD-10-CM

## 2012-10-29 MED ORDER — DONEPEZIL HCL 10 MG PO TABS: 10.0000 mg | ORAL_TABLET | Freq: Every day | ORAL | Status: DC

## 2012-10-29 NOTE — Progress Notes (Signed)
[name]  Follow-up Visit   Date: 10/29/2012    Lisa Mclaughlin MRN: [id number] DOB: [birth year]   Interim History: Lisa Mclaughlin is a 62 y.o. year-old right-handed Caucasian female of English ancestry with history of ADD (diagnosed, 2s) and depression (diagnosed 2012) returning for follow-up of memory loss.  She was last seen in the clinic on 09/26/2012.  The patient was accompanied to the clinic by her husband.   At her last visit, I started aricept 5mg  which she is tolerating without any side effects.  Vitamin E level was checked and was normal.  Her MRI brain shows premature cerebral and cerebellar atrophy.  There has been no significant changes over the past month.  No falls, behavioral changes, or hospitalizations.   Memory  Are you repeating things excessively? Yes  Are you forgetting important details of conversations/events? yes  Are you prone to misplacing items more now than in the past? Yes  Can you tell me about some recent headlines? "I don't know"  Executive  Are you having trouble managing financial matters? Husband manages finances  Are you taking your medications regularly w/o prompting? yes  Can you organize and prepare a large holiday meal for multiple people? Yes, but with some difficulty and frustration  Can you multitask effectively? No   Language  Do you have any word finding difficulties? occasionally Do you have trouble following instructions or a conversation? Sometimes  Have you been avoiding reading or writing due to problems with recognition or remembering words? No  Are you using generalities when speaking because of memory trouble? No, husband says she is a good conversationalist, but does tend to repeat herself   Visuospatial  Are you getting lost while driving? Not driving (stopped in [id number]) Are you getting turned around in your own home? No  Do you have trouble recognizing familiar faces/family members/close friends? No  Do  you have trouble dressing, putting on cloths? No    History of present illness: Husband reports that memory problems have been ongoing for the past 3-5 years, described as impairments with short-term memory, misplacing things, and trying to complete tasks. However, in March of 2013, her symptoms dramatically worsened and his behavior concerned her husband. He says that she started acting like she was in a dream world. She became very depressed and obsessed with childhood memorys where she was bullied as a young girl. She became paranoid that these girls were coming after her because of unpaid club dues. She started seeing a psychologist, Dr. Terrance Mass, who diagnosed her with depression. Over the past year, several medication adjustments have been made and her depression is almost 100% better but her memory remains impaired. Currently she takes Wellbutrin XR 300 mg daily Celexa 40 mg and [name] 5 mg daily. She is also on Adderall daily and Xanax as needed.   She is forgetting short-term memory of events, dates, and names. She feels that she is still able to function independently and does all of her ADLs. She stopped driving in [id number]. If she focuses on things, she is able to recall things better. She does not have any hobbies. Denies any problems with sleep, appetite, or mood. She spends her day doing things around the house, watches TV, and walks the dog. Of note, her mother had Alzheimer's disease. 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 Highest level of education was freshmen in college.    Medications:  Current Outpatient Prescriptions on File  Prior to Visit  Medication Sig Dispense Refill  . ALPRAZolam (XANAX) 0.25 MG tablet Take 0.25 mg by mouth 2 (two) times daily as needed.       Marland Kitchen amphetamine-dextroamphetamine (ADDERALL XR) 20 MG 24 hr capsule Take 1 capsule (20 mg total) by mouth every morning.  30 capsule  0  .  amphetamine-dextroamphetamine (ADDERALL) 10 MG tablet Take 1 tab in mid afteroon.  30 tablet  0  . ARIPiprazole ([name]) 5 MG tablet Take 1 tablet (5 mg total) by mouth daily.  30 tablet  3  . buPROPion (WELLBUTRIN XL) 300 MG 24 hr tablet Take 1 tablet (300 mg total) by mouth daily.  30 tablet  12  . calcium carbonate 200 MG capsule Take 250 mg by mouth 2 (two) times daily with a meal.      . cholecalciferol (VITAMIN D) 1000 UNITS tablet Take 1,000 Units by mouth daily.      . citalopram (CELEXA) 40 MG tablet Take 1 tablet (40 mg total) by mouth daily.  30 tablet  12  . donepezil (ARICEPT) 5 MG tablet Take 1 tablet (5 mg total) by mouth at bedtime.  30 tablet  5  . estradiol (VIVELLE-DOT) 0.1 MG/24HR patch Place 1 patch onto the skin 2 (two) times a week.      . Melatonin 10 MG CAPS Take by mouth.      . Multiple Vitamin (MULTIVITAMIN) tablet Take 1 tablet by mouth daily.      Marland Kitchen aspirin 81 MG tablet Take 81 mg by mouth daily.       No current facility-administered medications on file prior to visit.    Allergies:  Allergies  Allergen Reactions  . Tetracycline Hcl      Review of Systems:  CONSTITUTIONAL: No fevers, chills, night sweats, or weight loss.   EYES: No visual changes or eye pain ENT: No hearing changes.  No history of nose bleeds.   RESPIRATORY: No cough, wheezing and shortness of breath.   CARDIOVASCULAR: Negative for chest pain, and palpitations.   GI: Negative for abdominal discomfort, blood in stools or black stools.  No recent change in bowel habits.   GU:  No history of incontinence.   MUSCLOSKELETAL: No history of joint pain or swelling.  No myalgias.   SKIN: Negative for lesions, rash, and itching.   HEMATOLOGY/ONCOLOGY: Negative for prolonged bleeding, bruising easily, and swollen nodes.  No history of cancer.   ENDOCRINE: Negative for cold or heat intolerance, polydipsia or goiter.   PSYCH:  + depression or anxiety symptoms.   NEURO: As Above.   Vital Signs:   BP 132/88  Pulse 76  Temp(Src) 97.7 F (36.5 C)  Resp 12  Ht 5\' 2"  (1.575 m)  Wt 160 lb 1.6 oz (72.621 kg)  BMI 29.28 kg/m2   Neurological Exam: Alert: normal Oriented: to self only  Dressed:well-groomed  Eye Contact:normal, but reduced blink  Facial Expression: blunted at times but she would smile appropriately  Psychomotor agitation: none  Psychomotor retardation: none  Speech/Language: normal   Paraphrasic errors:none  Hesitation/Stuttering: None    Dysarthria:none  Mood: Good  Affect: Blunted Thought Content: Good  Hallucinations:none    Delusions:none   Responding to internal stimuli:N/A  Cognitive Exam  Fund of knowledge: Fair  Abstraction poor  Insight: Good  Apraxia: normal  3-Step Luria task: Impaired  MoCA 11/30 (-4 visuospatial/executive, -3 attention, -1 language, -2 abstraction, -5 delayed recall, -4 orientation)   Current events: "i don't know"  Motor strength:  5/5 in all extremities  Sensory:  Intact  Gait: Reduced arm swing bilaterally, normal posture, stable and narrow-based gait.  Data: MRI brain 10/01/2012: Premature for age cerebral and cerebellar atrophy. Chronic microvascular ischemic change affecting the periventricular greater than subcortical white matter.  CT brain 06/07/2012: Mild premature atrophy. Chronic microvascular ischemic change. No acute intracranial findings.  Neuropsychiatric testing 08/09/2012, 09/05/2012: Serious deficits with in multiple cognitive domains suggestive of a severe global cognitive decline that far exceed what might be expected on the basis of depression and ADHD. 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  Component     Latest Ref Rng 05/24/2012 09/26/2012  Alpha-Tocopherol     5.7 - 19.9 mg/L  31.0 (H)  Gamma-Tocopherol (Vit E)     <=4.3 mg/L  0.8  TSH     0.35 - 5.50 uIU/mL 0.53   Vitamin B-12     211 - 911 pg/mL 622     IMPRESSION: 1.  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  MoCA is 11/30 today (last month 10/30).  - Neuropsychiatric testing in 09/2012 showed "deficits within multiple cognitive domains suggestive of a severe and global cognitive decline that exceed what would be expected on the basis of depression and/or ADHD. 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   - MRI brain shows premature generalized atrophy.    - Her vitamin B12, TSH, and vitamin E levels are normal.  - Started Aricept 5mg  in 09/2012, will increase to 10mg  daily   - History of low B12 in the past, will recommend taking vitamin B12 1011mcg oral supplements  - Patient and husband are very interested in any clinical trials, nutritional supplements, or other programs available at academic centers.  I will place a referral to [facility] 2.  Depression  - Continue regimen as per psychiatrist 3.  Return to clinic in 2-3 months   The duration of this appointment visit was 30 minutes of face-to-face time with the patient.  Greater than 50% of this time was spent in counseling, explanation of diagnosis, planning of further management, and coordination of care.   Thank you for allowing me to participate in patient's care.  If I can answer any additional questions, I would be pleased to do so.    Sincerely,    Charvis Lightner K. Posey Pronto, DO

## 2012-10-29 NOTE — Patient Instructions (Addendum)
1.  Vitamin B12 1046mcg daily 2.  Continue Aricept 10mg  daily 3.  Referral to [facility] 4.  Return to clinic in January

## 2012-11-06 ENCOUNTER — Other Ambulatory Visit: Payer: Self-pay

## 2012-11-06 ENCOUNTER — Telehealth: Payer: Self-pay

## 2012-11-06 MED ORDER — AMPHETAMINE-DEXTROAMPHET ER 20 MG PO CP24: 20.0000 mg | ORAL_CAPSULE | ORAL | Status: DC

## 2012-11-06 MED ORDER — AMPHETAMINE-DEXTROAMPHETAMINE 10 MG PO TABS: ORAL_TABLET | ORAL | Status: DC

## 2012-11-06 MED ORDER — ARIPIPRAZOLE 5 MG PO TABS: 5.0000 mg | ORAL_TABLET | Freq: Every day | ORAL | Status: DC

## 2012-11-06 NOTE — Telephone Encounter (Signed)
Called pt's husband to inform of [facility] appointment on May 22nd at 1pm. Pt aware of appt.

## 2012-11-06 NOTE — Telephone Encounter (Signed)
All three printed. Thanks.

## 2012-11-06 NOTE — Telephone Encounter (Signed)
Randy request rx adderall xr and adderall 10 mg and abilify. Call when ready for pick up.

## 2012-11-07 ENCOUNTER — Other Ambulatory Visit: Payer: Self-pay

## 2012-11-07 NOTE — Telephone Encounter (Signed)
Left voicemail on husband's phone letting him know pt's Rx's ready for pick-up

## 2012-11-13 ENCOUNTER — Encounter: Payer: Self-pay | Admitting: Family Medicine

## 2012-11-13 ENCOUNTER — Ambulatory Visit (INDEPENDENT_AMBULATORY_CARE_PROVIDER_SITE_OTHER): Payer: Managed Care, Other (non HMO) | Admitting: Family Medicine

## 2012-11-13 VITALS — BP 118/78 | HR 72 | Temp 98.2°F | Wt 155.8 lb

## 2012-11-13 DIAGNOSIS — R413 Other amnesia: Secondary | ICD-10-CM

## 2012-11-13 DIAGNOSIS — M549 Dorsalgia, unspecified: Secondary | ICD-10-CM

## 2012-11-13 NOTE — Progress Notes (Signed)
Pre-visit discussion using our clinic review tool. No additional management support is needed unless otherwise documented below in the visit note.  Per wife and husband, her memory isn't worse.  The [facility] eval is pending but for 6 months out.  They wanted to get her in quicker.  They had pulled some dietary/lifestyle intervention articles and asked about my input.  I told them I would get back to them.    Pt was walking on the beach about 3 weeks ago.  She tripped over a dog.  No LOC.  Better in the meantime with heat and ibuprofen. It has been getting better slowly, leveling off.  "More annoying than painful."  R lower back.  No radicular pain.  No rash.    Meds, vitals, and allergies reviewed.   ROS: See HPI.  Otherwise, noncontributory.  nad ncat rrr ctab Back w/o midline pain abd soft R lower back mildly ttp w/o pain on SI testing No rash

## 2012-11-13 NOTE — Patient Instructions (Signed)
Let me check the memory articles and we'll be in touch.  Gently stretch your back and use the heating pad.  That should help.

## 2012-11-14 DIAGNOSIS — M549 Dorsalgia, unspecified: Secondary | ICD-10-CM | POA: Insufficient documentation

## 2012-11-14 NOTE — Assessment & Plan Note (Signed)
I told them I would check into this and get back with them. I'll need to review articles and resources in the meantime.

## 2012-11-14 NOTE — Assessment & Plan Note (Signed)
Benign exam, should resolve with stretching, heat and time.

## 2012-11-25 ENCOUNTER — Telehealth: Payer: Self-pay | Admitting: Family Medicine

## 2012-11-25 ENCOUNTER — Encounter: Payer: Self-pay | Admitting: *Deleted

## 2012-11-25 NOTE — Telephone Encounter (Signed)
Please call pt and husband.  I checked on as many sources as I could.  All of the diet and exercise recs are reasonable, ie low carb diet, yoga, etc.  Melatonin at night is reasonable.  HRT is through gyn clinic. I will defer to them/GYN about this.  Fish oil, B12, vit D, coQ10 and coconut oil are reasonable to try and shouldn't cause a problem.  Would be okay to try curcumin next, adding that on.  I would not add on bacopa monniere as it likely would have a similar mechanism of action as the aricept (and there likely isn't a need for potentially doubling up on that).  I would just continue the aricept.  If she can tolerate the curcumin, then I would add on the ashwagandha after about 4 weeks.    It should still be noted that the only reasons to do any of this now are that (1) the potential benefits may outweigh the risks and (2) they can't get into the tertiary care clinic for months.  I can't guarantee that this will be effective or tolerated.  The article presented by the patient/husband only has anecdotal evidence that may have significant confounders.  It's likely worth trying the above given her circumstances.    I didn't add the herbs/supplements to the med list yet.  We can see how she does/if they are tolerated and then add on if needed.     I wouldn't stop her other meds in the meantime.  I would like to see her back in about 2 months to tests her memory.  60min appointment.  Thanks.

## 2012-11-25 NOTE — Telephone Encounter (Signed)
Letter mailed and husband notified.

## 2012-11-30 ENCOUNTER — Other Ambulatory Visit: Payer: Self-pay | Admitting: Family Medicine

## 2012-12-02 ENCOUNTER — Encounter: Payer: Self-pay | Admitting: Family Medicine

## 2012-12-12 ENCOUNTER — Other Ambulatory Visit: Payer: Self-pay | Admitting: Family Medicine

## 2012-12-12 MED ORDER — AMPHETAMINE-DEXTROAMPHET ER 20 MG PO CP24: 20.0000 mg | ORAL_CAPSULE | ORAL | Status: DC

## 2012-12-12 MED ORDER — AMPHETAMINE-DEXTROAMPHETAMINE 10 MG PO TABS: ORAL_TABLET | ORAL | Status: DC

## 2012-12-12 NOTE — Telephone Encounter (Signed)
Patient advised.  Rx left at front desk for pick up. 

## 2012-12-12 NOTE — Telephone Encounter (Signed)
Printed, have to pick up.  Thanks.

## 2012-12-12 NOTE — Telephone Encounter (Signed)
Husband request refill of medication, and call back when ready   Also husband wanted to know if we can send it electronically, so please advise pt's spouse when we call to advise Rx ready for pick-up, that he does still have to pick-up Rx we can't send it electronically

## 2012-12-25 ENCOUNTER — Other Ambulatory Visit: Payer: Self-pay | Admitting: Obstetrics and Gynecology

## 2013-01-02 HISTORY — PX: DILATION AND CURETTAGE OF UTERUS: SHX78

## 2013-01-13 ENCOUNTER — Other Ambulatory Visit: Payer: Self-pay

## 2013-01-13 NOTE — Telephone Encounter (Signed)
Mr Bitner left v/m requesting rx adderall xr and adderall 10 mg. Pt needs to pick up by 01/15/14. Call when ready for pick up. DPR form will be brought to office.

## 2013-01-14 MED ORDER — AMPHETAMINE-DEXTROAMPHETAMINE 10 MG PO TABS: ORAL_TABLET | ORAL | Status: DC

## 2013-01-14 MED ORDER — AMPHETAMINE-DEXTROAMPHET ER 20 MG PO CP24: 20.0000 mg | ORAL_CAPSULE | ORAL | Status: DC

## 2013-01-14 NOTE — Telephone Encounter (Signed)
Printed and both placed in Kim's box.

## 2013-01-14 NOTE — Telephone Encounter (Signed)
Patient notified and Rx placed up front for pick up. 

## 2013-01-17 ENCOUNTER — Other Ambulatory Visit: Payer: Self-pay | Admitting: Obstetrics & Gynecology

## 2013-01-21 ENCOUNTER — Ambulatory Visit: Payer: Managed Care, Other (non HMO) | Admitting: Neurology

## 2013-01-27 ENCOUNTER — Encounter: Payer: Self-pay | Admitting: Family Medicine

## 2013-01-27 ENCOUNTER — Ambulatory Visit (INDEPENDENT_AMBULATORY_CARE_PROVIDER_SITE_OTHER): Payer: Managed Care, Other (non HMO) | Admitting: Family Medicine

## 2013-01-27 VITALS — BP 122/70 | HR 74 | Temp 98.0°F | Wt 152.2 lb

## 2013-01-27 DIAGNOSIS — R413 Other amnesia: Secondary | ICD-10-CM

## 2013-01-27 MED ORDER — AMPHETAMINE-DEXTROAMPHETAMINE 10 MG PO TABS: ORAL_TABLET | ORAL | Status: DC

## 2013-01-27 MED ORDER — AMPHETAMINE-DEXTROAMPHET ER 20 MG PO CP24: 20.0000 mg | ORAL_CAPSULE | ORAL | Status: DC

## 2013-01-27 NOTE — Progress Notes (Signed)
Pre-visit discussion using our clinic review tool. No additional management support is needed unless otherwise documented below in the visit note.  Memory followup.  Mood improved on current meds. She feels that things have changed for the better. She is more responsive and attitude is better. Working to replicate supplements published in a [employer] study for dietary supplements in Alzheimer's disease. They are also seeing integrative MD in [facility].  Exercising.  She started reading again. She is taking Aricept 10mg  daily. She continues to have problems with short-term memory and misplaces things, but there has been no worsening since the last visit.    Meds, vitals, and allergies reviewed.   ROS: See HPI.  Otherwise, noncontributory.  GEN: nad, alert but not oriented to year.  Can recall her breakfast and do basic math.  Doesn't know the president initially, but can recall his last name when given his first name as a reminder.  HEENT: mucous membranes moist NECK: supple w/o LA CV: rrr. PULM: ctab, no inc wob ABD: soft, +bs EXT: no edema

## 2013-01-27 NOTE — Patient Instructions (Signed)
I'll await your follow up notes.  Don't change your adderall for now.  Take care.  Glad to see you.

## 2013-01-28 ENCOUNTER — Ambulatory Visit (INDEPENDENT_AMBULATORY_CARE_PROVIDER_SITE_OTHER): Payer: Managed Care, Other (non HMO) | Admitting: Neurology

## 2013-01-28 ENCOUNTER — Encounter: Payer: Self-pay | Admitting: Neurology

## 2013-01-28 VITALS — BP 118/70 | HR 74 | Temp 98.2°F | Ht 62.0 in | Wt 147.4 lb

## 2013-01-28 DIAGNOSIS — G300 Alzheimer's disease with early onset: Principal | ICD-10-CM

## 2013-01-28 DIAGNOSIS — G309 Alzheimer's disease, unspecified: Secondary | ICD-10-CM

## 2013-01-28 DIAGNOSIS — F0280 Dementia in other diseases classified elsewhere without behavioral disturbance: Secondary | ICD-10-CM

## 2013-01-28 MED ORDER — NAMENDA TITRATION PAK 28 X 5 MG & 21 X 10 MG PO TABS: ORAL_TABLET | ORAL | Status: DC

## 2013-01-28 NOTE — Patient Instructions (Signed)
1.  Start Namenda 5mg  as follows:  Week 1:  Take one tablet daily  Week 2:  Take one tablet twice daily  Week 3:  Take one tablet in the morning and 2 tablets at bedtime  Week 4:  Take two tablets twice daily 2.  Call with an update in 27-month, if you are tolerating medication, I will send a prescription for 10mg  tablets  3.  Return to clinic in 35-months

## 2013-01-28 NOTE — Progress Notes (Signed)
[facility]  Follow-up Visit   Date: 01/28/2013    Lisa Mclaughlin MRN: [id number] DOB: 1950-09-12   Interim History: Lisa Mclaughlin is a 63 y.o. year-old right-handed Caucasian female of English ancestry with history of ADD (diagnosed, 30s) and depression (diagnosed 2012) returning for follow-up of memory loss.  She was last seen in the clinic on 10/29/2012.  The patient was accompanied to the clinic by her husband.   She feels that things have changed for the better.  She is more responsive and attitude is much better.  They are working to replicate supplements published in a [employer] study for dietary supplements in Alzheimer's disease.  They are also seeing integrative therapy from [facility] and staying active in an exercise program.  Her mood is much better.  She started reading again, which is an improvement.  She is walking about 3 times per week and doing light weights.  She is taking Aricept 10mg  daily.  She continues to have problems with short-term memory and misplaces things, but there has been no worsening since the last visit.  History of present illness: Husband reports that memory problems have been ongoing for the past 3-5 years, described as impairments with short-term memory, misplacing things, and trying to complete tasks. However, in March of 2013, her symptoms dramatically worsened and his behavior concerned her husband. He says that she started acting like she was in a dream world. She became very depressed and obsessed with childhood memorys where she was bullied as a young girl. She became paranoid that these girls were coming after her because of unpaid club dues. She started seeing a psychologist, Dr. Terrance Mass, who diagnosed her with depression. Over the past year, several medication adjustments have been made and her depression is almost 100% better but her memory remains impaired. Currently she takes Wellbutrin XR 300 mg daily Celexa 40 mg and  [name] 5 mg daily. She is also on Adderall daily and Xanax as needed.   She is forgetting short-term memory of events, dates, and names. She feels that she is still able to function independently and does all of her ADLs. She stopped driving in [id number]. If she focuses on things, she is able to recall things better.    Medications:  Current Outpatient Prescriptions on File Prior to Visit  Medication Sig Dispense Refill  . Alpha Lipoic Acid 200 MG CAPS Take 250 mg by mouth daily.      Marland Kitchen amphetamine-dextroamphetamine (ADDERALL XR) 20 MG 24 hr capsule Take 1 capsule (20 mg total) by mouth every morning. Fill on/after 03/11/13  30 capsule  0  . amphetamine-dextroamphetamine (ADDERALL XR) 20 MG 24 hr capsule Take 1 capsule (20 mg total) by mouth every morning. Fill on/after 04/10/13  30 capsule  0  . amphetamine-dextroamphetamine (ADDERALL XR) 20 MG 24 hr capsule Take 1 capsule (20 mg total) by mouth every morning.  30 capsule  0  . amphetamine-dextroamphetamine (ADDERALL) 10 MG tablet Take 1 tab in the mid afternoon. Fill on/after 03/11/13  30 tablet  0  . amphetamine-dextroamphetamine (ADDERALL) 10 MG tablet Take 1 tab in the mid afternoon. Fill on/after 04/10/13  30 tablet  0  . amphetamine-dextroamphetamine (ADDERALL) 10 MG tablet Take 1 tab in mid afteroon.  30 tablet  0  . ARIPiprazole ([name]) 5 MG tablet Take 1 tablet (5 mg total) by mouth daily.  30 tablet  5  . buPROPion (WELLBUTRIN XL) 300 MG 24 hr tablet TAKE 1 TABLET BY MOUTH  DAILY  30 tablet  3  . citalopram (CELEXA) 40 MG tablet TAKE 1 TABLET BY MOUTH DAILY  30 tablet  3  . Cyanocobalamin (VITAMIN B 12 PO) Take 1,000 mcg by mouth daily.       Marland Kitchen donepezil (ARICEPT) 10 MG tablet Take 1 tablet (10 mg total) by mouth at bedtime.  30 tablet  3  . estradiol (VIVELLE-DOT) 0.1 MG/24HR patch Place 1 patch onto the skin 2 (two) times a week.      . Melatonin 10 MG CAPS Take by mouth.      . Multiple Vitamins-Minerals (HIGH POTENCY MULTIVIT/MIN/IRON)  TABS Take by mouth daily.      . niacinamide 500 MG tablet Take 500 mg by mouth daily.      . NON FORMULARY Bio-Curcumin 400 mg daily.      . NON FORMULARY Ashwagandha 125 mg daily      . NON FORMULARY Bone Restore with Vitamin K-2 Calcium (700 mg) with Vitamin D3 and K2 daily (4 caps serving size)      . NON FORMULARY Vitamin C Crystals 1/2 tsp daily (2.25 g) daily      . NON FORMULARY Citicholine 500 mg 2 times daily.      . Omega-3 Fatty Acids (SUPER OMEGA 3 PO) 2,000 mg. 4 caps serving size      . progesterone (PROMETRIUM) 100 MG capsule Take 100 mg by mouth daily.      . Vitamin D, Ergocalciferol, (DRISDOL) 50000 UNITS CAPS capsule Vitamins D 5000 IU and K 1100 mcg daily      . vitamin E 400 UNIT capsule Take 400 Units by mouth 2 (two) times daily.       No current facility-administered medications on file prior to visit.    Allergies:  Allergies  Allergen Reactions  . Tetracycline Hcl      Review of Systems:  CONSTITUTIONAL: No fevers, chills, night sweats, or weight loss.   EYES: No visual changes or eye pain ENT: No hearing changes.  No history of nose bleeds.   RESPIRATORY: No cough, wheezing and shortness of breath.   CARDIOVASCULAR: Negative for chest pain, and palpitations.   GI: Negative for abdominal discomfort, blood in stools or black stools.  No recent change in bowel habits.   GU:  No history of incontinence.   MUSCLOSKELETAL: No history of joint pain or swelling.  No myalgias.   SKIN: Negative for lesions, rash, and itching.   HEMATOLOGY/ONCOLOGY: Negative for prolonged bleeding, bruising easily, and swollen nodes.  No history of cancer.   ENDOCRINE: Negative for cold or heat intolerance, polydipsia or goiter.   PSYCH:  No depression or anxiety symptoms.   NEURO: As Above.   Vital Signs:  BP 118/70  Pulse 74  Temp(Src) 98.2 F (36.8 C) (Oral)  Ht 5\' 2"  (1.575 m)  Wt 147 lb 6.4 oz (66.86 kg)  BMI 26.95 kg/m2   Neurological Exam: Alert: normal  Oriented: to self only  Dressed:well-groomed  Eye Contact:normal, but reduced blink   Facial Expression: Appropriate  Psychomotor agitation: none  Psychomotor retardation: none  Speech/Language: normal   Paraphrasic errors:none  Hesitation/Stuttering: None    Dysarthria:none  Mood: Good  Affect: cheerful  Thought Content: Good   Cognitive Exam  Fund of knowledge: Fair  Abstraction poor  Insight: Good  Apraxia: normal  MoCA 11/30 is unchanged (-4 visuospatial/executive, -4 attention, -2 abstraction, -5 delayed recall, -4 orientation)   Motor strength:  5/5 in all extremities  Sensory:  Intact  Gait: Reduced arm swing bilaterally, normal posture, stable and narrow-based gait.  Data: MRI brain 10/01/2012: Premature for age cerebral and cerebellar atrophy. Chronic microvascular ischemic change affecting the periventricular greater than subcortical white matter.  CT brain 06/07/2012: Mild premature atrophy. Chronic microvascular ischemic change. No acute intracranial findings.  Neuropsychiatric testing 08/09/2012, 09/05/2012: Serious deficits with in multiple cognitive domains suggestive of a severe global cognitive decline that far exceed what might be expected on the basis of depression and ADHD. 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  Labs:  05/24/2012:  TSH 0.53, B12 622  09/26/2012:  Vitamin E 0.8   IMPRESSION: 1.  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  MoCA is 11/30 today (unchanged from 10/2012)  - Neuropsychiatric testing in 09/2012 showed "deficits within multiple cognitive domains suggestive of a severe and global cognitive decline that exceed what would be expected on the basis of depression and/or ADHD. 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   - MRI brain shows premature generalized atrophy.    -  Started Aricept in 09/2012 and tolerating it well  - Patient taking a list of supplements for memory without adverse effects  - Patient and husband are very interested in any clinical trials, nutritional supplements, or other programs available at academic centers 2.  Depression  - Continue regimen as per psychiatrist  PLAN: 1.  Continue aricept 10mg  daily 2.  Start Namenda 5mg  and increase by 5mg  each week to a goal of 10mg  BID.  Risks and benefits discussed. 3.  Referral to [facility] is pending 4.  Return to clinic in 57-months  The duration of this appointment visit was 30 minutes of face-to-face time with the patient.  Greater than 50% of this time was spent in counseling, explanation of diagnosis, planning of further management, and coordination of care.   Thank you for allowing me to participate in patient's care.  If I can answer any additional questions, I would be pleased to do so.    Sincerely,    Markeem Noreen K. Posey Pronto, DO

## 2013-01-29 NOTE — Assessment & Plan Note (Signed)
Would continue as is for now, mood is improved. She'll f/u with [facility] in the near future hopefully.  It appears that her supplements aren't causing a problem at this point.  Will await consult notes in meantime.

## 2013-02-26 ENCOUNTER — Telehealth: Payer: Self-pay | Admitting: Neurology

## 2013-02-26 MED ORDER — MEMANTINE HCL 10 MG PO TABS: 10.0000 mg | ORAL_TABLET | Freq: Two times a day (BID) | ORAL | Status: DC

## 2013-02-26 NOTE — Telephone Encounter (Signed)
Rx refilled for namenda 10mg  BID.  Victoriya Pol K. Posey Pronto, DO

## 2013-02-26 NOTE — Telephone Encounter (Signed)
Pt's spouse called requesting a refill for  [facility] 10mg   Pharamacy: [facility] on [address]

## 2013-02-26 NOTE — Telephone Encounter (Signed)
LM for pt that rx was sent in

## 2013-02-26 NOTE — Telephone Encounter (Signed)
Patient spouse also would like donepezil called in as well

## 2013-02-26 NOTE — Telephone Encounter (Signed)
I will send these in.  Just let me know how many refills please.  Thanks.

## 2013-02-28 ENCOUNTER — Other Ambulatory Visit: Payer: Self-pay | Admitting: Neurology

## 2013-02-28 MED ORDER — DONEPEZIL HCL 10 MG PO TABS: 10.0000 mg | ORAL_TABLET | Freq: Every day | ORAL | Status: DC

## 2013-02-28 NOTE — Addendum Note (Signed)
Addended by: Alda Berthold on: 02/28/2013 10:44 AM   Modules accepted: Orders

## 2013-02-28 NOTE — Telephone Encounter (Signed)
Aricept refill requested. Per last office note- patient to remain on medication. Refill approved and sent to patient's pharmacy.   

## 2013-03-28 ENCOUNTER — Other Ambulatory Visit: Payer: Self-pay | Admitting: *Deleted

## 2013-03-28 ENCOUNTER — Other Ambulatory Visit: Payer: Self-pay | Admitting: Family Medicine

## 2013-03-28 ENCOUNTER — Telehealth: Payer: Self-pay | Admitting: Neurology

## 2013-03-28 NOTE — Telephone Encounter (Signed)
Pt needs to have a rx called in  donepexil 10 mg tabs  [facility]

## 2013-03-28 NOTE — Telephone Encounter (Signed)
Spoke with Mr. Popal and informed him that she has refills on her rx.  Called pharmacy and they will have rx ready for her.

## 2013-03-30 ENCOUNTER — Other Ambulatory Visit: Payer: Self-pay | Admitting: Family Medicine

## 2013-04-29 ENCOUNTER — Encounter: Payer: Self-pay | Admitting: Neurology

## 2013-04-29 ENCOUNTER — Ambulatory Visit (INDEPENDENT_AMBULATORY_CARE_PROVIDER_SITE_OTHER): Payer: Managed Care, Other (non HMO) | Admitting: Neurology

## 2013-04-29 ENCOUNTER — Telehealth: Payer: Self-pay | Admitting: Neurology

## 2013-04-29 VITALS — BP 140/80 | HR 76 | Wt 156.3 lb

## 2013-04-29 DIAGNOSIS — G309 Alzheimer's disease, unspecified: Secondary | ICD-10-CM

## 2013-04-29 DIAGNOSIS — G300 Alzheimer's disease with early onset: Principal | ICD-10-CM

## 2013-04-29 DIAGNOSIS — F0280 Dementia in other diseases classified elsewhere without behavioral disturbance: Secondary | ICD-10-CM

## 2013-04-29 NOTE — Progress Notes (Signed)
[facility]  Follow-up Visit   Date: 04/29/2013    Lisa Mclaughlin MRN: [id number] DOB: January 21, 1950   Interim History: Lisa Mclaughlin is a 63 y.o. year-old right-handed Caucasian female of English ancestry with history of ADD (diagnosed, 59s) and depression (diagnosed 2012) returning for follow-up of memory loss.  She was last seen in the clinic on 01/28/2013.  The patient was accompanied to the clinic by her husband.   History of present illness: Husband reports that memory problems have been ongoing for the past 3-5 years, described as impairments with short-term memory, misplacing things, and trying to complete tasks. However, in March of 2013, her symptoms dramatically worsened and his behavior concerned her husband. He says that she started acting like she was in a dream world. She became very depressed and obsessed with childhood memorys where she was bullied as a young girl. She became paranoid that these girls were coming after her because of unpaid club dues. She started seeing a psychologist, Dr. Terrance Mass, who diagnosed her with depression. Over the past year, several medication adjustments have been made and her depression is almost 100% better but her memory remains impaired. Currently she takes Wellbutrin XR 300 mg daily Celexa 40 mg and [name] 5 mg daily. She is also on Adderall daily and Xanax as needed.   She is forgetting short-term memory of events, dates, and names. She feels that she is still able to function independently and does all of her ADLs. She stopped driving in [id number]. If she focuses on things, she is able to recall things better.  - Follow-up 01/28/2013:  She feels that things have changed for the better because attitude is better.  They are working to replicate supplements published in a [employer] study for dietary supplements in Alzheimer's disease.  They are also seeing integrative therapy from [facility] and staying active in an exercise  program.   - Follow-up 04/29/2013:  Her husband feels that she is stable, if not a little better.  For the first time, she worked on a crossword puzzle and has been reading more.  She is eager to leave the home and engage in activities.  She continues to take supplements, but has not been as compliant with exercises.  Dr. Audria Nine at [facility] and has been started on other supplements.  She is also planning on having her dental fillings and was tested positive for several food allergies.   Medications:  Current Outpatient Prescriptions on File Prior to Visit  Medication Sig Dispense Refill  . Alpha Lipoic Acid 200 MG CAPS Take 250 mg by mouth daily.      Marland Kitchen amphetamine-dextroamphetamine (ADDERALL XR) 20 MG 24 hr capsule Take 1 capsule (20 mg total) by mouth every morning. Fill on/after 03/11/13  30 capsule  0  . amphetamine-dextroamphetamine (ADDERALL) 10 MG tablet Take 1 tab in the mid afternoon. Fill on/after 03/11/13  30 tablet  0  . ARIPiprazole ([name]) 5 MG tablet Take 1 tablet (5 mg total) by mouth daily.  30 tablet  5  . buPROPion (WELLBUTRIN XL) 300 MG 24 hr tablet TAKE 1 TABLET BY MOUTH EVERY DAY  30 tablet  6  . citalopram (CELEXA) 40 MG tablet TAKE 1 TABLET BY MOUTH DAILY.  30 tablet  5  . Cyanocobalamin (VITAMIN B 12 PO) Take 1,000 mcg by mouth daily.       Marland Kitchen donepezil (ARICEPT) 10 MG tablet Take 1 tablet (10 mg total) by mouth at bedtime.  30 tablet  11  . estradiol (VIVELLE-DOT) 0.1 MG/24HR patch Place 1 patch onto the skin 2 (two) times a week.      . Melatonin 10 MG CAPS Take by mouth.      . memantine (NAMENDA) 10 MG tablet Take 1 tablet (10 mg total) by mouth 2 (two) times daily.  60 tablet  11  . Multiple Vitamins-Minerals (HIGH POTENCY MULTIVIT/MIN/IRON) TABS Take by mouth daily.      . niacinamide 500 MG tablet Take 500 mg by mouth daily.      . NON FORMULARY Bio-Curcumin 400 mg daily.      . NON FORMULARY Ashwagandha 125 mg daily      . NON FORMULARY  Bone Restore with Vitamin K-2 Calcium (700 mg) with Vitamin D3 and K2 daily (4 caps serving size)      . NON FORMULARY Vitamin C Crystals 1/2 tsp daily (2.25 g) daily      . NON FORMULARY Citicholine 500 mg 2 times daily.      . Omega-3 Fatty Acids (SUPER OMEGA 3 PO) 2,000 mg. 4 caps serving size      . progesterone (PROMETRIUM) 100 MG capsule Take 100 mg by mouth daily.      . Vitamin D, Ergocalciferol, (DRISDOL) 50000 UNITS CAPS capsule Vitamins D 5000 IU and K 1100 mcg daily      . vitamin E 400 UNIT capsule Take 400 Units by mouth 2 (two) times daily.       No current facility-administered medications on file prior to visit.    Allergies:  Allergies  Allergen Reactions  . Tetracycline Hcl      Review of Systems:  CONSTITUTIONAL: No fevers, chills, night sweats, or weight loss.   EYES: No visual changes or eye pain ENT: No hearing changes.  No history of nose bleeds.   RESPIRATORY: No cough, wheezing and shortness of breath.   CARDIOVASCULAR: Negative for chest pain, and palpitations.   GI: Negative for abdominal discomfort, blood in stools or black stools.  No recent change in bowel habits.   GU:  No history of incontinence.   MUSCLOSKELETAL: No history of joint pain or swelling.  No myalgias.   SKIN: Negative for lesions, rash, and itching.   HEMATOLOGY/ONCOLOGY: Negative for prolonged bleeding, bruising easily, and swollen nodes.    ENDOCRINE: Negative for cold or heat intolerance, polydipsia or goiter.   PSYCH:  No depression or anxiety symptoms.   NEURO: As Above.   Vital Signs:  BP 140/80  Pulse 76  Wt 156 lb 5 oz (70.903 kg)  SpO2 94%   Neurological Exam: Alert: normal Oriented: to self only  Dressed:well-groomed  Eye Contact:normal, but reduced blink   Facial Expression: Appropriate  Psychomotor agitation: none  Psychomotor retardation: none  Speech/Language: normal   Paraphrasic errors:none  Hesitation/Stuttering: None    Dysarthria:none  Mood: Good  Affect:  Blunted at times, but appropriately cheerful  Thought Content: Good   Cognitive Exam  Fund of knowledge: Fair  Abstraction poor  Insight: Good  Apraxia: normal  Luria test:  Normal bilaterally (improved) MoCA 12/30 is improved from 11/30 in 10/2012 (-4 visuospatial/executive, -3 attention, -2 abstraction, -5 delayed recall, -4 orientation)   Motor strength:  5/5 in all extremities  Sensory:  Intact  Gait: Reduced arm swing bilaterally, normal posture, stable and narrow-based gait.  Data: MRI brain 10/01/2012: Premature for age cerebral and cerebellar atrophy. Chronic microvascular ischemic change affecting the periventricular greater than subcortical white matter.  CT brain 06/07/2012: Mild premature atrophy. Chronic microvascular ischemic change. No acute intracranial findings.  Neuropsychiatric testing 08/09/2012, 09/05/2012: Serious deficits with in multiple cognitive domains suggestive of a severe global cognitive decline that far exceed what might be expected on the basis of depression and ADHD. 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  Labs:  05/24/2012:  TSH 0.53, B12 622  09/26/2012:  Vitamin E 0.8   IMPRESSION: 1.  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  MoCA is 12/30 today (1 point improvement from 10/2012)  - Started Aricept in 09/2012 and Namenda in 01/2013 and tolerating it well  - Patient taking a list of supplements for memory without adverse effects, in fact, feels it is helping 2.  Depression  - Continue regimen as per psychiatrist  PLAN: 1.  Continue aricept 10mg  daily, may consider increasing dose to 23 mg at next visit 2.  Continue Namenda 10mg  BID.   3.  Patient to see [facility] on 05/23/2013 4.  Return to clinic in 79-months   The duration of this appointment visit was 30 minutes of face-to-face time with  the patient.  Greater than 50% of this time was spent in counseling, explanation of diagnosis, planning of further management, and coordination of care.   Thank you for allowing me to participate in patient's care.  If I can answer any additional questions, I would be pleased to do so.    Sincerely,    Donika K. Posey Pronto, DO

## 2013-04-29 NOTE — Telephone Encounter (Signed)
Noted.  Donika K. Patel, DO   

## 2013-04-29 NOTE — Patient Instructions (Signed)
1.  Continue aricept 10mg  daily 2.  Continue Namenda 10mg  BID.   3.  Encouraged to stay active physically and mentally 4.  Return to clinic in 11-months

## 2013-04-29 NOTE — Telephone Encounter (Signed)
Pt states that she is to see Dr Ritta Slot at [facility] on May 22

## 2013-04-30 ENCOUNTER — Encounter: Payer: Self-pay | Admitting: *Deleted

## 2013-05-07 ENCOUNTER — Encounter: Payer: Self-pay | Admitting: *Deleted

## 2013-05-15 ENCOUNTER — Other Ambulatory Visit: Payer: Self-pay

## 2013-05-15 NOTE — Telephone Encounter (Signed)
Mr Lisa Mclaughlin request rx adderall xr and adderall. Call when ready for pick up.

## 2013-05-16 MED ORDER — AMPHETAMINE-DEXTROAMPHET ER 20 MG PO CP24
20.0000 mg | ORAL_CAPSULE | ORAL | Status: DC
Start: 2013-05-16 — End: 2013-07-16

## 2013-05-16 MED ORDER — AMPHETAMINE-DEXTROAMPHET ER 20 MG PO CP24: 20.0000 mg | ORAL_CAPSULE | ORAL | Status: DC

## 2013-05-16 MED ORDER — AMPHETAMINE-DEXTROAMPHET ER 20 MG PO CP24: 20.0000 mg | ORAL_CAPSULE | Freq: Every day | ORAL | Status: DC

## 2013-05-16 MED ORDER — AMPHETAMINE-DEXTROAMPHETAMINE 10 MG PO TABS: ORAL_TABLET | ORAL | Status: DC

## 2013-05-16 NOTE — Telephone Encounter (Signed)
Printed.  Thanks.  

## 2013-05-16 NOTE — Telephone Encounter (Signed)
Husband advised.  Rx left at front desk for pick up.

## 2013-05-19 ENCOUNTER — Telehealth: Payer: Self-pay | Admitting: Neurology

## 2013-05-19 NOTE — Telephone Encounter (Signed)
Notes faxed.

## 2013-05-19 NOTE — Telephone Encounter (Signed)
Please fax pt's medical records to [facility]. Dr. Posey Pronto is the referring provider. Fax# [phone], Attn: Dr. Lois Huxley / Gayleen Orem.

## 2013-05-22 ENCOUNTER — Encounter: Payer: Self-pay | Admitting: Family Medicine

## 2013-05-30 ENCOUNTER — Other Ambulatory Visit: Payer: Self-pay | Admitting: Family Medicine

## 2013-05-30 NOTE — Telephone Encounter (Signed)
Electronic refill request. Last Filled:   30 tablet 5  RF on  11/06/2012.  Please advise.

## 2013-05-31 NOTE — Telephone Encounter (Signed)
Sent!

## 2013-06-10 ENCOUNTER — Encounter: Payer: Self-pay | Admitting: Family Medicine

## 2013-07-10 ENCOUNTER — Ambulatory Visit: Payer: Managed Care, Other (non HMO) | Admitting: Family Medicine

## 2013-07-16 ENCOUNTER — Ambulatory Visit (INDEPENDENT_AMBULATORY_CARE_PROVIDER_SITE_OTHER): Payer: Managed Care, Other (non HMO) | Admitting: Family Medicine

## 2013-07-16 ENCOUNTER — Encounter: Payer: Self-pay | Admitting: Family Medicine

## 2013-07-16 VITALS — BP 110/76 | HR 74 | Temp 97.8°F | Wt 152.5 lb

## 2013-07-16 DIAGNOSIS — R413 Other amnesia: Secondary | ICD-10-CM

## 2013-07-16 MED ORDER — AMPHETAMINE-DEXTROAMPHETAMINE 10 MG PO TABS: ORAL_TABLET | ORAL | Status: DC

## 2013-07-16 MED ORDER — AMPHETAMINE-DEXTROAMPHET ER 20 MG PO CP24: 20.0000 mg | ORAL_CAPSULE | ORAL | Status: DC

## 2013-07-16 MED ORDER — AMPHETAMINE-DEXTROAMPHET ER 20 MG PO CP24: 20.0000 mg | ORAL_CAPSULE | Freq: Every day | ORAL | Status: DC

## 2013-07-16 NOTE — Progress Notes (Signed)
Pre visit review using our clinic review tool, if applicable. No additional management support is needed unless otherwise documented below in the visit note.  She had local neuro f/u and then eval at [facility].  She tried to tape off namenda but her memory sx seemed to be worse, so that was restarted in the meantime. She and her husband are working to get he involved in a trial in [place], they are waiting to hear about enrollment.  They have f/u with neuro locally pending.  Her mood is good with current meds and she continues to work on her diet.  She has variable sleep patterns, she is working on that.  She hasn't done many crosswords recently.  She had outside labs recently done, husband will work on sending those to me. There was a question of a mercury elevation.  She has had mult dental interventions recently.   Meds, vitals, and allergies reviewed.   ROS: See HPI.  Otherwise, noncontributory.  GEN: nad, alert, pleasant HEENT: mucous membranes moist NECK: supple w/o LA CV: rrr.  PULM: ctab, no inc wob ABD: soft, +bs EXT: no edema

## 2013-07-16 NOTE — Patient Instructions (Signed)
Send me a copy of your labs that were recently done.   Take care. Glad to see you.

## 2013-07-17 NOTE — Assessment & Plan Note (Signed)
See above.  I'll await the neuro recs and the trial enrollment. They'll send me a copy of her recent labs.  In the meantime, I wouldn't change her psych meds- mood and concentration are okay at the moment. I refilled 3 pairs of adderall rxs, to be filled monthly.

## 2013-07-29 ENCOUNTER — Encounter: Payer: Self-pay | Admitting: Neurology

## 2013-07-29 ENCOUNTER — Ambulatory Visit (INDEPENDENT_AMBULATORY_CARE_PROVIDER_SITE_OTHER): Payer: Managed Care, Other (non HMO) | Admitting: Neurology

## 2013-07-29 VITALS — BP 138/74 | HR 70 | Ht 62.0 in | Wt 151.3 lb

## 2013-07-29 DIAGNOSIS — G300 Alzheimer's disease with early onset: Principal | ICD-10-CM

## 2013-07-29 DIAGNOSIS — F0280 Dementia in other diseases classified elsewhere without behavioral disturbance: Secondary | ICD-10-CM

## 2013-07-29 DIAGNOSIS — G309 Alzheimer's disease, unspecified: Secondary | ICD-10-CM

## 2013-07-29 NOTE — Patient Instructions (Addendum)
1.  Continue Aricept 10mg  daily 2.  Continue Namenda 10mg  BID.   3.  Consider repeat neuropsychiatric testing going foward  4.  Return to clinic in 81-months

## 2013-07-29 NOTE — Progress Notes (Signed)
[facility]  Follow-up Visit   Date: 07/29/2013    Kourtney Knapper MRN: [id number] DOB: 30-May-1950   Interim History: Lisa Mclaughlin is a 63 y.o. year-old right-handed Caucasian female of English ancestry with history of ADD (diagnosed, 86s) and depression (diagnosed 2012) returning for follow-up of memory loss.  She was last seen in the clinic on 01/28/2013.  The patient was accompanied to the clinic by her husband.   History of present illness: Husband reports that memory problems have been ongoing for the past 3-5 years, described as impairments with short-term memory, misplacing things, and trying to complete tasks. However, in March of 2013, her symptoms dramatically worsened and his behavior concerned her husband. He says that she started acting like she was in a dream world. She became very depressed and obsessed with childhood memorys where she was bullied as a young girl. She became paranoid that these girls were coming after her because of unpaid club dues. She started seeing a psychologist, Dr. Terrance Mass, who diagnosed her with depression. Over the past year, several medication adjustments have been made and her depression is almost 100% better but her memory remains impaired. Currently she takes Wellbutrin XR 300 mg daily Celexa 40 mg and [name] 5 mg daily. She is also on Adderall daily and Xanax as needed.   She is forgetting short-term memory of events, dates, and names. She feels that she is still able to function independently and does all of her ADLs. She stopped driving in [id number]. If she focuses on things, she is able to recall things better.  - Follow-up 01/28/2013:  She feels that things have changed for the better because attitude is better.  They are working to replicate supplements published in a [employer] study for dietary supplements in Alzheimer's disease.  They are also seeing integrative therapy from [facility] and staying active in an exercise  program.   - Follow-up 04/29/2013:  Her husband feels that she is stable, if not a little better.  For the first time, she worked on a crossword puzzle and has been reading more.  She is eager to leave the home and engage in activities.  Dr. Audria Nine at [facility] and has been started on other supplements.  She is also planning on having her dental fillings and was tested positive for several food allergies.  UPDATE 07/29/2013:  He saw Dr. Werner Lean at [facility] who agreed with diagnosis of Alzheimer's dementia. Because of their interest in clinical trials related to supplements, he was give information on a trial in [facility]) which is the same that they are trying to replicate at home.  He recommended tapering the namenda but there was mild worsening in memory, so he restarted it. She feels that her memory is relatively stable.  They have increased their activity and they did their fun yoga class last night.  Mood continues to stay positive.  She has since had 7 teeth removed and is doing oral chelation through Dr. Verlon Au clinic.   Medications:  Current Outpatient Prescriptions on File Prior to Visit  Medication Sig Dispense Refill  . Alpha Lipoic Acid 200 MG CAPS Take 250 mg by mouth daily.      Marland Kitchen amphetamine-dextroamphetamine (ADDERALL XR) 20 MG 24 hr capsule Take 1 capsule (20 mg total) by mouth daily. Fill on/after 08/15/13  30 capsule  0  . amphetamine-dextroamphetamine (ADDERALL) 10 MG tablet Take 1 tab mid afternoon. Fill on/after 08/15/13  30 tablet  0  . ARIPiprazole ([name]) 5 MG tablet TAKE 1 TABLET BY MOUTH DAILY.  30 tablet  2  . Ashwagandha Extract 2.5 % POWD by Does not apply route.      Marland Kitchen buPROPion (WELLBUTRIN XL) 300 MG 24 hr tablet TAKE 1 TABLET BY MOUTH EVERY DAY  30 tablet  6  . citalopram (CELEXA) 40 MG tablet TAKE 1 TABLET BY MOUTH DAILY.  30 tablet  5  . COCONUT OIL PO Take by mouth.      . Coenzyme Q10 (COQ10) 200 MG CAPS Take by mouth.       . Cyanocobalamin (VITAMIN B 12 PO) Take 1,000 mcg by mouth daily.       Marland Kitchen donepezil (ARICEPT) 10 MG tablet Take 1 tablet (10 mg total) by mouth at bedtime.  30 tablet  11  . estradiol (VIVELLE-DOT) 0.1 MG/24HR patch Place 1 patch onto the skin 2 (two) times a week.      . magnesium 30 MG tablet Take 30 mg by mouth 2 (two) times daily.      . Melatonin 10 MG CAPS Take by mouth.      . memantine (NAMENDA) 10 MG tablet Take 1 tablet (10 mg total) by mouth 2 (two) times daily.  60 tablet  11  . Multiple Vitamins-Minerals (HIGH POTENCY MULTIVIT/MIN/IRON) TABS Take by mouth daily.      . niacinamide 500 MG tablet Take 500 mg by mouth daily.      . NON FORMULARY Bio-Curcumin 400 mg daily.      . NON FORMULARY Ashwagandha 125 mg daily      . NON FORMULARY Bone Restore with Vitamin K-2 Calcium (700 mg) with Vitamin D3 and K2 daily (4 caps serving size)      . NON FORMULARY Vitamin C Crystals 1/2 tsp daily (2.25 g) daily      . NON FORMULARY Citicholine 500 mg 2 times daily.      . NON FORMULARY DMSA 250 mg, one by mouth every other day      . NON FORMULARY EDTA 1200 mg by mouth every other day      . Omega-3 Fatty Acids (SUPER OMEGA 3 PO) 2,000 mg. 4 caps serving size      . Probiotic Product (PROBIOTIC DAILY PO) Take by mouth daily.      . progesterone (PROMETRIUM) 100 MG capsule Take 100 mg by mouth daily.      Marland Kitchen thiamine 100 MG tablet Take 100 mg by mouth daily.      Marland Kitchen thyroid (ARMOUR) 32.5 MG tablet Take 32.5 mg by mouth daily.      . Uridine POWD by Does not apply route.      . Vitamin D, Ergocalciferol, (DRISDOL) 50000 UNITS CAPS capsule Vitamins D 5000 IU and K 1100 mcg daily      . vitamin E 400 UNIT capsule Take 400 Units by mouth 2 (two) times daily.       No current facility-administered medications on file prior to visit.    Allergies:  Allergies  Allergen Reactions  . Gluten Meal Other (See Comments)  . Other Other (See Comments)    Food allergies: almond, banana, casein,  cheese, cola, egg white, flaxseed, gluten, malt, cow and goat milks, mushrooms, pineapple, salmon, sesame, [place], wheat, whey, bakers and brewers yeast, yogurt.   . Tetracycline Hcl      Review of Systems:  CONSTITUTIONAL: No fevers, chills, night sweats, or weight loss.   EYES: No visual  changes or eye pain ENT: No hearing changes.  No history of nose bleeds.   RESPIRATORY: No cough, wheezing and shortness of breath.   CARDIOVASCULAR: Negative for chest pain, and palpitations.   GI: Negative for abdominal discomfort, blood in stools or black stools.  No recent change in bowel habits.   GU:  No history of incontinence.   MUSCLOSKELETAL: No history of joint pain or swelling.  No myalgias.   SKIN: Negative for lesions, rash, and itching.   HEMATOLOGY/ONCOLOGY: Negative for prolonged bleeding, bruising easily, and swollen nodes.    ENDOCRINE: Negative for cold or heat intolerance, polydipsia or goiter.   PSYCH:  No depression or anxiety symptoms.   NEURO: As Above.   Vital Signs:  BP 138/74  Pulse 70  Ht 5\' 2"  (1.575 m)  Wt 151 lb 5 oz (68.635 kg)  BMI 27.67 kg/m2  SpO2 95%   Neurological Exam: Alert: normal  Oriented: to self, city and state  Dressed:well-groomed   Eye Contact:normal, but reduced blink   Facial Expression: Appropriate  Psychomotor agitation: none  Psychomotor retardation: none  Speech/Language: normal   Dysarthria:none  Mood: Good  Affect: Blunted at times, but appropriately cheerful  Thought Content: Good   Cognitive Exam  Fund of knowledge: Fair  Abstraction poor  Insight: Good  Apraxia: normal  Luria test:  Normal bilaterally  President:  "Obama" Dinner last night:  "I don't know"  Motor strength:  5/5 in all extremities  Gait: Reduced arm swing bilaterally, normal posture, stable and narrow-based gait.  Data: MRI brain 10/01/2012: Premature for age cerebral and cerebellar atrophy. Chronic microvascular ischemic change affecting the  periventricular greater than subcortical white matter.  CT brain 06/07/2012: Mild premature atrophy. Chronic microvascular ischemic change. No acute intracranial findings.  Neuropsychiatric testing 08/09/2012, 09/05/2012: Serious deficits with in multiple cognitive domains suggestive of a severe global cognitive decline that far exceed what might be expected on the basis of depression and ADHD. 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  Labs:  05/24/2012:  TSH 0.53, B12 622  09/26/2012:  Vitamin E 0.8   IMPRESSION: 1.  Early onset Alzheimer's dementia  - Clinically stable  - Cognitive deficits in all domains, except for naming.  - Started Aricept in 09/2012 and Namenda in 01/2013 and tolerating it well  - Patient taking a list of supplements for memory without adverse effects as they are trying to replicate study from [facility]  2.  Depression  - Continue regimen as per psychiatrist  PLAN: 1.  Continue aricept 10mg  daily 2.  Continue Namenda 10mg  BID.   3.  Recommending repeat neuropsychiatric testing, but patient would like to hold until they have determine whether patient will be enrolling in clinical trial ([facility]) in [place] 4.  Return to clinic in 51-months   The duration of this appointment visit was 25 minutes of face-to-face time with the patient.  Greater than 50% of this time was spent in counseling, explanation of diagnosis, planning of further management, and coordination of care.   Thank you for allowing me to participate in patient's care.  If I can answer any additional questions, I would be pleased to do so.    Sincerely,    Deontray Hunnicutt K. Posey Pronto, DO

## 2013-08-20 ENCOUNTER — Other Ambulatory Visit: Payer: Self-pay | Admitting: Family Medicine

## 2013-08-20 NOTE — Telephone Encounter (Signed)
Received refill request electronically. Last refill 05/30/13 #30/2 refills, last office visit 07/16/13. Is it okay to refill medication?

## 2013-08-21 NOTE — Telephone Encounter (Signed)
Sent!

## 2013-10-23 ENCOUNTER — Other Ambulatory Visit: Payer: Self-pay | Admitting: Family Medicine

## 2013-10-23 NOTE — Telephone Encounter (Signed)
Electronic refill request

## 2013-10-24 NOTE — Telephone Encounter (Signed)
Sent. Thanks.   

## 2013-11-12 ENCOUNTER — Other Ambulatory Visit: Payer: Self-pay | Admitting: Family Medicine

## 2013-11-12 NOTE — Telephone Encounter (Signed)
Received refill request electronically from pharmacy. Last office visit 07/16/13. Is it okay to refill medication?

## 2013-11-12 NOTE — Telephone Encounter (Signed)
Sent. Thanks.   

## 2013-11-25 ENCOUNTER — Other Ambulatory Visit: Payer: Self-pay

## 2013-11-25 NOTE — Telephone Encounter (Signed)
Pt's husband,Randy left v/m requesting rx for Adderall. Call when ready for pick up.

## 2013-11-26 MED ORDER — AMPHETAMINE-DEXTROAMPHET ER 20 MG PO CP24: 20.0000 mg | ORAL_CAPSULE | Freq: Every day | ORAL | Status: DC

## 2013-11-26 MED ORDER — AMPHETAMINE-DEXTROAMPHETAMINE 10 MG PO TABS: ORAL_TABLET | ORAL | Status: DC

## 2013-11-26 NOTE — Telephone Encounter (Signed)
It looks like these are due.  If you are okay with printing them, then please do so.  Otherwise I'll print when I get back to clinic. Thanks.

## 2013-11-26 NOTE — Telephone Encounter (Signed)
Patient's husband notified and Rx's placed up front for pick up.

## 2013-11-26 NOTE — Telephone Encounter (Signed)
Printed and in Kim's box 

## 2014-01-06 ENCOUNTER — Other Ambulatory Visit: Payer: Self-pay | Admitting: Family Medicine

## 2014-01-06 NOTE — Telephone Encounter (Signed)
Last filled 11/24/2013

## 2014-01-07 MED ORDER — AMPHETAMINE-DEXTROAMPHET ER 20 MG PO CP24: 20.0000 mg | ORAL_CAPSULE | Freq: Every day | ORAL | Status: DC

## 2014-01-07 MED ORDER — AMPHETAMINE-DEXTROAMPHETAMINE 10 MG PO TABS: ORAL_TABLET | ORAL | Status: DC

## 2014-01-07 NOTE — Telephone Encounter (Signed)
Left message on voicemail for patient to call back. Patient needs to sign a new control substance contract.

## 2014-01-07 NOTE — Telephone Encounter (Signed)
Both printed. Thanks.

## 2014-01-08 ENCOUNTER — Encounter: Payer: Self-pay | Admitting: Family Medicine

## 2014-01-08 NOTE — Telephone Encounter (Signed)
Patient and wife came by office to pick up Rx and sign contract.

## 2014-01-22 ENCOUNTER — Encounter: Payer: Self-pay | Admitting: Family Medicine

## 2014-01-27 ENCOUNTER — Ambulatory Visit: Payer: Managed Care, Other (non HMO) | Admitting: Neurology

## 2014-02-02 ENCOUNTER — Other Ambulatory Visit: Payer: Self-pay | Admitting: *Deleted

## 2014-02-02 MED ORDER — AMPHETAMINE-DEXTROAMPHET ER 20 MG PO CP24: 20.0000 mg | ORAL_CAPSULE | Freq: Every day | ORAL | Status: DC

## 2014-02-02 MED ORDER — AMPHETAMINE-DEXTROAMPHETAMINE 10 MG PO TABS: ORAL_TABLET | ORAL | Status: DC

## 2014-02-02 NOTE — Telephone Encounter (Signed)
Printed.  Thanks.  

## 2014-02-02 NOTE — Telephone Encounter (Signed)
Patient's husband left a voicemail requesting refills on Adderall and Adderall XR. Request was made for a 3 months supply because they have such a long drive to the office. Call when ready for pickup.

## 2014-02-03 NOTE — Telephone Encounter (Signed)
Husband advised.  Rx left at front desk for pick up.

## 2014-02-19 ENCOUNTER — Ambulatory Visit (INDEPENDENT_AMBULATORY_CARE_PROVIDER_SITE_OTHER): Payer: BLUE CROSS/BLUE SHIELD | Admitting: Neurology

## 2014-02-19 ENCOUNTER — Encounter: Payer: Self-pay | Admitting: Neurology

## 2014-02-19 VITALS — BP 130/84 | HR 73 | Ht 62.0 in | Wt 167.4 lb

## 2014-02-19 DIAGNOSIS — G300 Alzheimer's disease with early onset: Secondary | ICD-10-CM

## 2014-02-19 DIAGNOSIS — F0280 Dementia in other diseases classified elsewhere without behavioral disturbance: Secondary | ICD-10-CM

## 2014-02-19 MED ORDER — MEMANTINE HCL 10 MG PO TABS: 10.0000 mg | ORAL_TABLET | Freq: Two times a day (BID) | ORAL | Status: DC

## 2014-02-19 MED ORDER — DONEPEZIL HCL 23 MG PO TABS: 23.0000 mg | ORAL_TABLET | Freq: Every day | ORAL | Status: DC

## 2014-02-19 NOTE — Progress Notes (Signed)
Lisa Mclaughlin  Follow-up Visit   Date: 02/19/2014    Lisa Mclaughlin MRN: [id number] DOB: 01-05-1950   Interim History: Lisa Mclaughlin is a 64 y.o. year-old right-handed Caucasian female of English ancestry with history of ADD (diagnosed, 67s) and depression (diagnosed 2012) returning for follow-up of early onset Alzheimer's dementia.  The patient was accompanied to the clinic by her husband.   History of present illness: Husband reports that memory problems have been ongoing for the past 3-5 years, described as impairments with short-term memory, misplacing things, and trying to complete tasks. However, in March of 2013, her symptoms dramatically worsened and his behavior concerned her husband. He says that she started acting like she was in a dream world. She became very depressed and obsessed with childhood memorys where she was bullied as a young girl. She became paranoid that these girls were coming after her because of unpaid club dues. She started seeing a psychologist, Dr. Terrance Mass, who diagnosed her with depression. Over the past year, several medication adjustments have been made and her depression is almost 100% better but her memory remains impaired. Currently she takes Wellbutrin XR 300 mg daily Celexa 40 mg and [name] 5 mg daily. She is also on Adderall daily and Xanax as needed.   She is forgetting short-term memory of events, dates, and names. She feels that she is still able to function independently and does all of her ADLs. She stopped driving in [id number]. If she focuses on things, she is able to recall things better.  - Follow-up 01/28/2013:  She feels that things have changed for the better because attitude is better.  They are working to replicate supplements published in a [employer] study for dietary supplements in Alzheimer's disease.  They are also seeing integrative therapy from [facility] and staying active in an exercise program.   - Follow-up  04/29/2013:  Her husband feels that she is stable, if not a little better.  For the first time, she worked on a crossword puzzle and has been reading more.  She is eager to leave the home and engage in activities.  Dr. Audria Nine at [facility] and has been started on other supplements.  She is also planning on having her dental fillings and was tested positive for several food allergies.  - Follow-up 07/29/2013:  He saw Dr. Werner Lean at [facility] who agreed with diagnosis of Alzheimer's dementia. Because of their interest in clinical trials related to supplements, he was give information on a trial in [facility]) which is the same that they are trying to replicate at home.  He recommended tapering the namenda but there was mild worsening in memory, so he restarted it. She feels that her memory is relatively stable.  They have increased their activity and they did their fun yoga class last night.  Mood continues to stay positive.  She has since had 7 teeth removed and is doing oral chelation through Dr. Verlon Au clinic.  UPDATE 02/19/2014:  Clinical trial that they were hoping to enroll has not started yet.  Overall, they feel she has slipped a little but attitude remains good.  She reports having less interests and no longer works on crossword puzzles or goes to yoga class. Her mood has been great, no concerns with depression.  She spends her day watching TV, reading, and taking naps.  She is able to make the bed and tidy around the home some, but unable to do other household  chores such as cleaning, laundry, and cooking.  There are no home safety concerns at this time. Her integrative physician said her thyroid level was very low and made medication changes as needed.       Medications:  Current Outpatient Prescriptions on File Prior to Visit  Medication Sig Dispense Refill  . Alpha Lipoic Acid 200 MG CAPS Take 250 mg by mouth daily.    Marland Kitchen amphetamine-dextroamphetamine  (ADDERALL XR) 20 MG 24 hr capsule Take 1 capsule (20 mg total) by mouth daily. 30 capsule 0  . amphetamine-dextroamphetamine (ADDERALL XR) 20 MG 24 hr capsule Take 1 capsule (20 mg total) by mouth daily. Fill on/after 03/03/14 30 capsule 0  . amphetamine-dextroamphetamine (ADDERALL XR) 20 MG 24 hr capsule Take 1 capsule (20 mg total) by mouth daily. Fill on/after 04/03/14 30 capsule 0  . amphetamine-dextroamphetamine (ADDERALL) 10 MG tablet Take 1 tab mid afternoon. 30 tablet 0  . amphetamine-dextroamphetamine (ADDERALL) 10 MG tablet Take 1 tab in mid afteroon. Fill on/after 04/03/14 30 tablet 0  . amphetamine-dextroamphetamine (ADDERALL) 10 MG tablet Take 1 tab in mid afteroon. Fill on/after 03/03/14 30 tablet 0  . ARIPiprazole ([name]) 5 MG tablet TAKE 1 TABLET BY MOUTH DAILY 30 tablet 5  . Ashwagandha Extract 2.5 % POWD by Does not apply route.    Marland Kitchen buPROPion (WELLBUTRIN XL) 300 MG 24 hr tablet TAKE 1 TABLET BY MOUTH EVERY DAY 30 tablet 5  . citalopram (CELEXA) 40 MG tablet TAKE 1 TABLET BY MOUTH DAILY 30 tablet 12  . COCONUT OIL PO Take by mouth.    . Coenzyme Q10 (COQ10) 200 MG CAPS Take by mouth.    . Cyanocobalamin (VITAMIN B 12 PO) Take 1,000 mcg by mouth daily.     Marland Kitchen estradiol (VIVELLE-DOT) 0.1 MG/24HR patch Place 1 patch onto the skin 2 (two) times a week.    . magnesium 30 MG tablet Take 30 mg by mouth 2 (two) times daily.    . Melatonin 10 MG CAPS Take by mouth.    . Multiple Vitamins-Minerals (HIGH POTENCY MULTIVIT/MIN/IRON) TABS Take by mouth daily.    . niacinamide 500 MG tablet Take 500 mg by mouth daily.    . NON FORMULARY Bio-Curcumin 400 mg daily.    . NON FORMULARY Ashwagandha 125 mg daily    . NON FORMULARY Bone Restore with Vitamin K-2 Calcium (700 mg) with Vitamin D3 and K2 daily (4 caps serving size)    . NON FORMULARY Vitamin C Crystals 1/2 tsp daily (2.25 g) daily    . NON FORMULARY Citicholine 500 mg 2 times daily.    . NON FORMULARY DMSA 250 mg, one by mouth every other  day    . NON FORMULARY EDTA 1200 mg by mouth every other day    . Omega-3 Fatty Acids (SUPER OMEGA 3 PO) 2,000 mg. 4 caps serving size    . Probiotic Product (PROBIOTIC DAILY PO) Take by mouth daily.    . progesterone (PROMETRIUM) 100 MG capsule Take 100 mg by mouth daily.    Marland Kitchen thiamine 100 MG tablet Take 100 mg by mouth daily.    Marland Kitchen thyroid (ARMOUR) 32.5 MG tablet Take 32.5 mg by mouth daily.    . Uridine POWD by Does not apply route.    . Vitamin D, Ergocalciferol, (DRISDOL) 50000 UNITS CAPS capsule Vitamins D 5000 IU and K 1100 mcg daily    . vitamin E 400 UNIT capsule Take 400 Units by mouth 2 (two) times daily.  No current facility-administered medications on file prior to visit.    Allergies:  Allergies  Allergen Reactions  . Gluten Meal Other (See Comments)  . Other Other (See Comments)    Food allergies: almond, banana, casein, cheese, cola, egg white, flaxseed, gluten, malt, cow and goat milks, mushrooms, pineapple, salmon, sesame, [place], wheat, whey, bakers and brewers yeast, yogurt.   . Tetracycline Hcl      Review of Systems:  CONSTITUTIONAL: No fevers, chills, night sweats, or weight loss.   EYES: No visual changes or eye pain ENT: No hearing changes.  No history of nose bleeds.   RESPIRATORY: No cough, wheezing and shortness of breath.   CARDIOVASCULAR: Negative for chest pain, and palpitations.   GI: Negative for abdominal discomfort, blood in stools or black stools.  No recent change in bowel habits.   GU:  No history of incontinence.   MUSCLOSKELETAL: No history of joint pain or swelling.  No myalgias.   SKIN: Negative for lesions, rash, and itching.   HEMATOLOGY/ONCOLOGY: Negative for prolonged bleeding, bruising easily, and swollen nodes.    ENDOCRINE: Negative for cold or heat intolerance, polydipsia or goiter.   PSYCH:  No depression or anxiety symptoms.   NEURO: As Above.   Vital Signs:  BP 130/84 mmHg  Pulse 73  Ht 5\' 2"  (1.575 m)  Wt 167 lb 6 oz  (75.921 kg)  BMI 30.61 kg/m2  SpO2 94%  Neurological Exam: Montreal Cognitive Assessment  02/19/2014  Visuospatial/ Executive (0/5) 1  Naming (0/3) 3  Attention: Read list of digits (0/2) 2  Attention: Read list of letters (0/1) 0  Attention: Serial 7 subtraction starting at 100 (0/3) 0  Language: Repeat phrase (0/2) 2  Language : Fluency (0/1) 0  Abstraction (0/2) 0  Delayed Recall (0/5) 0  Orientation (0/6) 1  Total 9  Adjusted Score (based on education) 9    Alert: normal  Oriented: to self  Dressed:well-groomed   Eye Contact:normal, but reduced blink   Facial Expression: Appropriate  Psychomotor agitation: none  Psychomotor retardation: none  Speech/Language: normal   Dysarthria:none  Mood: Good  Affect: Blunted at times, but appropriately cheerful  Thought Content: Good   Cognitive Exam  Fund of knowledge: Fair  Abstraction poor  Insight: Good  Apraxia: normal  Luria test:  Normal bilaterally  Cranial nerves:  Pupils round and reactive bilaterally.  Extraocular muscles intact.  Face is symmetric.  Tongue is midline.  Palate elevates symmetrically.   Motor strength:  5/5 in all extremities, normal tone  Reflexes:  Reflexes are brisk 3+ in the upper extremities and 2+ in the lower extremities bilaterally.    Gait:  Finger tapping and heel taping intact.  No dysmetria with finger to nose testing.  Reduced arm swing bilaterally, normal posture, stable and narrow-based gait.  Data: MRI brain 10/01/2012: Premature for age cerebral and cerebellar atrophy. Chronic microvascular ischemic change affecting the periventricular greater than subcortical white matter.  CT brain 06/07/2012: Mild premature atrophy. Chronic microvascular ischemic change. No acute intracranial findings.  Neuropsychiatric testing 08/09/2012, 09/05/2012: Serious deficits with in multiple cognitive domains suggestive of a severe global cognitive decline that far exceed what might be expected on the basis of  depression and ADHD. 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  Labs:  05/24/2012:  TSH 0.53, B12 622  09/26/2012:  Vitamin E 0.8   IMPRESSION: 1.  Early onset Alzheimer's dementia, moderate  - Clinically with worsening symptoms as  would be expected.  She is still doing own ADLs, but has fewer interests now  - Started Aricept in 09/2012 and Namenda in 01/2013 and tolerating it well  - Patient taking a list of supplements for memory without adverse effects as they are trying to replicate study from [facility]  2.  Depression  - Continue regimen as per psychiatrist  PLAN: 1.  Increase aricept 23mg  daily 2.  Continue Namenda 10mg  BID 3.  Home safety issues discussed, including Life Alert.  No concerns with her being home alone at this time. 4.  Return to clinic in 1 year   The duration of this appointment visit was 25 minutes of face-to-face time with the patient.  Greater than 50% of this time was spent in counseling, explanation of diagnosis, planning of further management, and coordination of care.   Thank you for allowing me to participate in patient's care.  If I can answer any additional questions, I would be pleased to do so.    Sincerely,    Donika K. Posey Pronto, DO

## 2014-02-19 NOTE — Patient Instructions (Signed)
1.  Increase aricept to 23mg  daily 2.  Continue namenda 10mg  twice daily 3.  Return to clinic in 1 year

## 2014-03-06 ENCOUNTER — Other Ambulatory Visit: Payer: Self-pay | Admitting: Family Medicine

## 2014-05-12 ENCOUNTER — Other Ambulatory Visit: Payer: Self-pay

## 2014-05-12 NOTE — Telephone Encounter (Signed)
Mr Lisa Mclaughlin left v/m requesting rx x 3 for adderall xr 20 mg. Call when ready for pick up. Pt last seen 07/16/13 and rx last printed 02/02/14 for 3 prescriptions.Please advise.

## 2014-05-13 MED ORDER — AMPHETAMINE-DEXTROAMPHET ER 20 MG PO CP24: 20.0000 mg | ORAL_CAPSULE | Freq: Every day | ORAL | Status: DC

## 2014-05-13 NOTE — Telephone Encounter (Signed)
Due for cpe this summer. Printed. Thanks.

## 2014-05-13 NOTE — Telephone Encounter (Signed)
Patient's husband notified by telephone that script is up front ready for pickup.

## 2014-05-19 ENCOUNTER — Telehealth: Payer: Self-pay | Admitting: Family Medicine

## 2014-05-19 NOTE — Telephone Encounter (Signed)
Sent.  Needs f/u OV this summer, 44min. Thanks.

## 2014-05-19 NOTE — Telephone Encounter (Signed)
Electronic refill request. Last office visit:   07/16/13 Last Filled:    30 tablet 5 RF on 11/12/2013  Please advise.

## 2014-05-19 NOTE — Telephone Encounter (Signed)
Please schedule office visit as instructed.

## 2014-05-20 NOTE — Telephone Encounter (Signed)
Appointment 7/22 Spouse aware Please close

## 2014-05-20 NOTE — Telephone Encounter (Signed)
Left message asking pt to call office  °

## 2014-07-09 ENCOUNTER — Ambulatory Visit (INDEPENDENT_AMBULATORY_CARE_PROVIDER_SITE_OTHER): Payer: BLUE CROSS/BLUE SHIELD | Admitting: Internal Medicine

## 2014-07-09 ENCOUNTER — Encounter: Payer: Self-pay | Admitting: Internal Medicine

## 2014-07-09 VITALS — BP 140/80 | HR 70 | Temp 98.1°F | Wt 175.0 lb

## 2014-07-09 DIAGNOSIS — R600 Localized edema: Secondary | ICD-10-CM

## 2014-07-09 LAB — COMPREHENSIVE METABOLIC PANEL
ALK PHOS: 108 U/L (ref 39–117)
ALT: 23 U/L (ref 0–35)
AST: 18 U/L (ref 0–37)
Albumin: 3.6 g/dL (ref 3.5–5.2)
BUN: 16 mg/dL (ref 6–23)
CO2: 27 mEq/L (ref 19–32)
Calcium: 9.3 mg/dL (ref 8.4–10.5)
Chloride: 105 mEq/L (ref 96–112)
Creatinine, Ser: 0.94 mg/dL (ref 0.40–1.20)
GFR: 63.64 mL/min (ref >60.00)
GLUCOSE: 93 mg/dL (ref 70–99)
Potassium: 4.5 mEq/L (ref 3.5–5.1)
SODIUM: 137 meq/L (ref 135–145)
Total Bilirubin: 0.3 mg/dL (ref 0.2–1.2)
Total Protein: 6.6 g/dL (ref 6.0–8.3)

## 2014-07-09 LAB — CBC WITH DIFFERENTIAL/PLATELET
BASOS PCT: 0.3 % (ref 0.0–3.0)
Basophils Absolute: 0 10*3/uL (ref 0.0–0.1)
Eosinophils Absolute: 0.2 10*3/uL (ref 0.0–0.7)
Eosinophils Relative: 2.3 % (ref 0.0–5.0)
HEMATOCRIT: 42.6 % (ref 36.0–46.0)
HEMOGLOBIN: 14.4 g/dL (ref 12.0–15.0)
LYMPHS ABS: 1.6 10*3/uL (ref 0.7–4.0)
LYMPHS PCT: 18.8 % (ref 12.0–46.0)
MCHC: 33.7 g/dL (ref 30.0–36.0)
MCV: 91.6 fl (ref 78.0–100.0)
MONOS PCT: 8.6 % (ref 3.0–12.0)
Monocytes Absolute: 0.7 10*3/uL (ref 0.1–1.0)
Neutro Abs: 5.8 10*3/uL (ref 1.4–7.7)
Neutrophils Relative %: 70 % (ref 43.0–77.0)
Platelets: 276 10*3/uL (ref 150.0–400.0)
RBC: 4.65 Mil/uL (ref 3.87–5.11)
RDW: 13.8 % (ref 11.5–15.5)
WBC: 8.3 10*3/uL (ref 4.0–10.5)

## 2014-07-09 LAB — T4, FREE: Free T4: 0.61 ng/dL (ref 0.60–1.60)

## 2014-07-09 MED ORDER — FUROSEMIDE 40 MG PO TABS: 40.0000 mg | ORAL_TABLET | Freq: Every day | ORAL | Status: DC

## 2014-07-09 NOTE — Progress Notes (Signed)
Pre visit review using our clinic review tool, if applicable. No additional management support is needed unless otherwise documented below in the visit note. 

## 2014-07-09 NOTE — Progress Notes (Signed)
Subjective:    Patient ID: Lisa Mclaughlin, female    DOB: 08-Jun-1950, 64 y.o.   MRN: [id number]  HPI Here with husband due to leg swelling Noticed on recent trip to [place] to see son Started 4-5 days ago Some better at first--then worsened yesterday and today Ate out almost every meal  Always hungry--weight is up 25# over the past year  Legs not painful No chest pain No SOB  Memory and function issues have been stable  Current Outpatient Prescriptions on File Prior to Visit  Medication Sig Dispense Refill  . ARIPiprazole ([name]) 5 MG tablet TAKE 1 TABLET BY MOUTH DAILY 90 tablet 3  . buPROPion (WELLBUTRIN XL) 300 MG 24 hr tablet TAKE ONE TABLET BY MOUTH EVERY DAY 30 tablet 5  . citalopram (CELEXA) 40 MG tablet TAKE 1 TABLET BY MOUTH DAILY 30 tablet 12  . donepezil (ARICEPT) 23 MG TABS tablet Take 1 tablet (23 mg total) by mouth at bedtime. 30 tablet 11  . estradiol (VIVELLE-DOT) 0.1 MG/24HR patch Place 1 patch onto the skin 2 (two) times a week.    . Melatonin 10 MG CAPS Take by mouth.    . memantine (NAMENDA) 10 MG tablet Take 1 tablet (10 mg total) by mouth 2 (two) times daily. 60 tablet 11  . Multiple Vitamins-Minerals (HIGH POTENCY MULTIVIT/MIN/IRON) TABS Take by mouth daily.    Marland Kitchen NATURE-THROID 65 MG tablet daily.   5   No current facility-administered medications on file prior to visit.    Allergies  Allergen Reactions  . Gluten Meal Other (See Comments)  . Other Other (See Comments)    Food allergies: almond, banana, casein, cheese, cola, egg white, flaxseed, gluten, malt, cow and goat milks, mushrooms, pineapple, salmon, sesame, [place], wheat, whey, bakers and brewers yeast, yogurt.   . Tetracycline Hcl     Past Medical History  Diagnosis Date  . Scoliosis   . Osteoporosis   . History of anemia   . ADD (attention deficit disorder with hyperactivity)     adult  . Depression   . Osteopenia 2002    dexa  . Alzheimer's disease, early onset     Past Surgical  History  Procedure Laterality Date  . Cesarean section      x 3  . Nasal sinus surgery    . Breast enhancement surgery    . Stress myoview normal  06/06  . Pelvic ultrasound  05/10    thickened endometrium and ? polyp vs fibroid  . Dilation and curettage of uterus  2015    Family History  Problem Relation Age of Onset  . Heart disease Mother     CAD  . Alzheimer's disease Mother     Died, 57  . Heart disease Father      CAD MI at age 7  . Hypertension Father   . Hypertension Sister   . Cancer Other     leukemia  . Diabetes Sister   . Cancer Other     breast    History   Social History  . Marital Status: Married    Spouse Name: N/A  . Number of Children: 3  . Years of Education: N/A   Occupational History  . 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 Smoking status: Former Smoker    Quit date: 09/27/1979  . Smokeless tobacco: Never Used  . Alcohol Use: 0.0 oz/week    0  Standard drinks or equivalent per week     Comment: occassionally  . Drug Use: No  . Sexual Activity: Not on file   Other Topics Concern  . 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 Sleeps flat in bed--no PND No palpitations    Objective:   Physical Exam  Constitutional: She appears well-developed and well-nourished. No distress.  Neck: Normal range of motion. Neck supple. No thyromegaly present.  Cardiovascular: Normal rate, regular rhythm, normal heart sounds and intact distal pulses.  Exam reveals no gallop.   No murmur heard. Pulmonary/Chest: Effort normal and breath sounds normal. No respiratory distress. She has no wheezes. She has no rales.  Abdominal: Soft. There is no tenderness.  Musculoskeletal:  1+ non pitting edema No calf tenderness   Lymphadenopathy:      She has no cervical adenopathy.  Skin:  No ulcers on LE          Assessment & Plan:

## 2014-07-09 NOTE — Patient Instructions (Signed)
Please take the furosemide now --fill it right now. Take another one tomorrow morning unless your legs are completely back to normal.  Low-Sodium Eating Plan Sodium raises blood pressure and causes water to be held in the body. Getting less sodium from food will help lower your blood pressure, reduce any swelling, and protect your heart, liver, and kidneys. We get sodium by adding salt (sodium chloride) to food. Most of our sodium comes from canned, boxed, and frozen foods. Restaurant foods, fast foods, and pizza are also very high in sodium. Even if you take medicine to lower your blood pressure or to reduce fluid in your body, getting less sodium from your food is important. WHAT IS MY PLAN? Most people should limit their sodium intake to 2,300 mg a day. Your health care provider recommends that you limit your sodium intake to __________ a day.  WHAT DO I NEED TO KNOW ABOUT THIS EATING PLAN? For the low-sodium eating plan, you will follow these general guidelines:  Choose foods with a % Daily Value for sodium of less than 5% (as listed on the food label).   Use salt-free seasonings or herbs instead of table salt or sea salt.   Check with your health care provider or pharmacist before using salt substitutes.   Eat fresh foods.  Eat more vegetables and fruits.  Limit canned vegetables. If you do use them, rinse them well to decrease the sodium.   Limit cheese to 1 oz (28 g) per day.   Eat lower-sodium products, often labeled as "lower sodium" or "no salt added."  Avoid foods that contain monosodium glutamate (MSG). MSG is sometimes added to [place] food and some canned foods.  Check food labels (Nutrition Facts labels) on foods to learn how much sodium is in one serving.  Eat more home-cooked food and less restaurant, buffet, and fast food.  When eating at a restaurant, ask that your food be prepared with less salt or none, if possible.  HOW DO I READ FOOD LABELS FOR SODIUM  INFORMATION? The Nutrition Facts label lists the amount of sodium in one serving of the food. If you eat more than one serving, you must multiply the listed amount of sodium by the number of servings. Food labels may also identify foods as:  Sodium free--Less than 5 mg in a serving.  Very low sodium--35 mg or less in a serving.  Low sodium--140 mg or less in a serving.  Light in sodium--50% less sodium in a serving. For example, if a food that usually has 300 mg of sodium is changed to become light in sodium, it will have 150 mg of sodium.  Reduced sodium--25% less sodium in a serving. For example, if a food that usually has 400 mg of sodium is changed to reduced sodium, it will have 300 mg of sodium. WHAT FOODS CAN I EAT? Grains Low-sodium cereals, including oats, puffed wheat and rice, and shredded wheat cereals. Low-sodium crackers. Unsalted rice and pasta. Lower-sodium bread.  Vegetables Frozen or fresh vegetables. Low-sodium or reduced-sodium canned vegetables. Low-sodium or reduced-sodium tomato sauce and paste. Low-sodium or reduced-sodium tomato and vegetable juices.  Fruits Fresh, frozen, and canned fruit. Fruit juice.  Meat and Other Protein Products Low-sodium canned tuna and salmon. Fresh or frozen meat, poultry, seafood, and fish. Lamb. Unsalted nuts. Dried beans, peas, and lentils without added salt. Unsalted canned beans. Homemade soups without salt. Eggs.  Dairy Milk. Soy milk. Ricotta cheese. Low-sodium or reduced-sodium cheeses. Yogurt.  Condiments Fresh  and dried herbs and spices. Salt-free seasonings. Onion and garlic powders. Low-sodium varieties of mustard and ketchup. Lemon juice.  Fats and Oils Reduced-sodium salad dressings. Unsalted butter.  Other Unsalted popcorn and pretzels.  The items listed above may not be a complete list of recommended foods or beverages. Contact your dietitian for more options. WHAT FOODS ARE NOT  RECOMMENDED? Grains Instant hot cereals. Bread stuffing, pancake, and biscuit mixes. Croutons. Seasoned rice or pasta mixes. Noodle soup cups. Boxed or frozen macaroni and cheese. Self-rising flour. Regular salted crackers. Vegetables Regular canned vegetables. Regular canned tomato sauce and paste. Regular tomato and vegetable juices. Frozen vegetables in sauces. Salted french fries. Olives. Angie Fava. Relishes. Sauerkraut. Salsa. Meat and Other Protein Products Salted, canned, smoked, spiced, or pickled meats, seafood, or fish. Bacon, ham, sausage, hot dogs, corned beef, chipped beef, and packaged luncheon meats. Salt pork. Jerky. Pickled herring. Anchovies, regular canned tuna, and sardines. Salted nuts. Dairy Processed cheese and cheese spreads. Cheese curds. Blue cheese and cottage cheese. Buttermilk.  Condiments Onion and garlic salt, seasoned salt, table salt, and sea salt. Canned and packaged gravies. Worcestershire sauce. Tartar sauce. Barbecue sauce. Teriyaki sauce. Soy sauce, including reduced sodium. Steak sauce. Fish sauce. Oyster sauce. Cocktail sauce. Horseradish. Regular ketchup and mustard. Meat flavorings and tenderizers. Bouillon cubes. Hot sauce. Tabasco sauce. Marinades. Taco seasonings. Relishes. Fats and Oils Regular salad dressings. Salted butter. Margarine. Ghee. Bacon fat.  Other Potato and tortilla chips. Corn chips and puffs. Salted popcorn and pretzels. Canned or dried soups. Pizza. Frozen entrees and pot pies.  The items listed above may not be a complete list of foods and beverages to avoid. Contact your dietitian for more information. Document Released: 06/10/2001 Document Revised: 12/24/2012 Document Reviewed: 10/23/2012 [facility] Patient Information 2015 [place], [place]. This information is not intended to replace advice given to you by your health care provider. Make sure you discuss any questions you have with your health care provider.

## 2014-07-09 NOTE — Assessment & Plan Note (Signed)
Since recent trip to [place] History and exam not consistent with DVT Nothing to suggest CHF Will check renal and hepatic labs Over 20# weight gain in pat year---probably from abilify Dietary indiscretion with eating out and immobilization the probable cause Will give trial furosemide

## 2014-07-10 ENCOUNTER — Ambulatory Visit (INDEPENDENT_AMBULATORY_CARE_PROVIDER_SITE_OTHER): Payer: BLUE CROSS/BLUE SHIELD | Admitting: Family Medicine

## 2014-07-10 ENCOUNTER — Encounter: Payer: Self-pay | Admitting: Family Medicine

## 2014-07-10 VITALS — BP 132/74 | HR 77 | Temp 98.4°F | Wt 172.2 lb

## 2014-07-10 DIAGNOSIS — R413 Other amnesia: Secondary | ICD-10-CM | POA: Diagnosis not present

## 2014-07-10 MED ORDER — ARIPIPRAZOLE 5 MG PO TABS: 2.5000 mg | ORAL_TABLET | Freq: Every day | ORAL | Status: DC

## 2014-07-10 MED ORDER — AMPHETAMINE-DEXTROAMPHET ER 20 MG PO CP24: 20.0000 mg | ORAL_CAPSULE | Freq: Every day | ORAL | Status: DC

## 2014-07-10 MED ORDER — AMPHETAMINE-DEXTROAMPHET ER 20 MG PO CP24: 20.0000 mg | ORAL_CAPSULE | ORAL | Status: DC

## 2014-07-10 NOTE — Progress Notes (Signed)
Pre visit review using our clinic review tool, if applicable. No additional management support is needed unless otherwise documented below in the visit note.  Recently with trip to [place], eating fast food/eating out.  BLE edema noted.  Seen yesterday, labs unremarkable.  Some better edema today.  Has rx for lasix to use prn.   Memory loss.  Has had f/u with neuro.  She wasn't able to be enrolled in a trial as she had hoped.  Continues on baseline meds.  Memory has worsened.  Now with caregiver 3.5 days per week, at home with husband o/w.  Still not sleeping well but mood is good and concentration is better on current meds.  She has weight gain likely related to dec in exercise and abilify use.    Meds, vitals, and allergies reviewed.   ROS: See HPI.  Otherwise, noncontributory.  nad but not oriented to year.  She knows her birth date, but not her current address.  Can do basic math but can't read a watch.  Attn 3/3 but 0/3 recall.  ncat Pleasant in conversation Mmm Neck supple, no LA rrr ctab abd soft Ext with trace to 1+ BLE edema, symmetric.

## 2014-07-10 NOTE — Patient Instructions (Signed)
Wearing an ID bracelet is a good idea.  Take lasix for a few days if needed.  Get back to exercising and try to cut out salt.  Drink plenty of water.  Cut the abilify back to 2.5mg  a day.  Update me in a few weeks.  Take care.  Glad to see you.

## 2014-07-13 NOTE — Assessment & Plan Note (Addendum)
Would continue as is but with dec in abilify given her stable mood and inc in weight.  He edema is likely incidental and will likely improve with less salt intake.  Will have patient/husband update [place] after the abilify change.  Diet and exercise d/w pt and husband.  >25 minutes spent in face to face time with patient, >50% spent in counselling or coordination of care.

## 2014-07-23 ENCOUNTER — Telehealth: Payer: Self-pay

## 2014-07-23 NOTE — Telephone Encounter (Signed)
Left a message for patient to return my call about having a Mammogram set up. Will await call back.

## 2014-07-24 ENCOUNTER — Ambulatory Visit: Payer: BLUE CROSS/BLUE SHIELD | Admitting: Family Medicine

## 2014-09-21 ENCOUNTER — Other Ambulatory Visit: Payer: Self-pay | Admitting: Obstetrics & Gynecology

## 2014-09-23 LAB — CYTOLOGY - PAP

## 2014-09-28 ENCOUNTER — Emergency Department (HOSPITAL_COMMUNITY)
Admission: EM | Admit: 2014-09-28 | Discharge: 2014-09-28 | Disposition: A | Discharge status: Home / Self Care | Payer: BLUE CROSS/BLUE SHIELD | Attending: Emergency Medicine | Admitting: Emergency Medicine

## 2014-09-28 DIAGNOSIS — G300 Alzheimer's disease with early onset: Secondary | ICD-10-CM | POA: Diagnosis not present

## 2014-09-28 DIAGNOSIS — Y9289 Other specified places as the place of occurrence of the external cause: Secondary | ICD-10-CM | POA: Insufficient documentation

## 2014-09-28 DIAGNOSIS — Y9389 Activity, other specified: Secondary | ICD-10-CM | POA: Diagnosis not present

## 2014-09-28 DIAGNOSIS — F909 Attention-deficit hyperactivity disorder, unspecified type: Secondary | ICD-10-CM | POA: Insufficient documentation

## 2014-09-28 DIAGNOSIS — Z87891 Personal history of nicotine dependence: Secondary | ICD-10-CM | POA: Diagnosis not present

## 2014-09-28 DIAGNOSIS — M419 Scoliosis, unspecified: Secondary | ICD-10-CM | POA: Insufficient documentation

## 2014-09-28 DIAGNOSIS — Y998 Other external cause status: Secondary | ICD-10-CM | POA: Insufficient documentation

## 2014-09-28 DIAGNOSIS — Z79899 Other long term (current) drug therapy: Secondary | ICD-10-CM | POA: Insufficient documentation

## 2014-09-28 DIAGNOSIS — T462X1A Poisoning by other antidysrhythmic drugs, accidental (unintentional), initial encounter: Secondary | ICD-10-CM | POA: Diagnosis not present

## 2014-09-28 DIAGNOSIS — J029 Acute pharyngitis, unspecified: Secondary | ICD-10-CM | POA: Diagnosis not present

## 2014-09-28 DIAGNOSIS — F329 Major depressive disorder, single episode, unspecified: Secondary | ICD-10-CM | POA: Insufficient documentation

## 2014-09-28 DIAGNOSIS — Z862 Personal history of diseases of the blood and blood-forming organs and certain disorders involving the immune mechanism: Secondary | ICD-10-CM | POA: Insufficient documentation

## 2014-09-28 DIAGNOSIS — T5791XA Toxic effect of unspecified inorganic substance, accidental (unintentional), initial encounter: Secondary | ICD-10-CM

## 2014-09-28 LAB — I-STAT TROPONIN, ED: TROPONIN I, POC: 0 ng/mL (ref 0.00–0.08)

## 2014-09-28 LAB — CBC
HCT: 44.4 % (ref 36.0–46.0)
Hemoglobin: 14.7 g/dL (ref 12.0–15.0)
MCH: 31.3 pg (ref 26.0–34.0)
MCHC: 33.1 g/dL (ref 30.0–36.0)
MCV: 94.7 fL (ref 78.0–100.0)
Platelets: 301 10*3/uL (ref 150–400)
RBC: 4.69 MIL/uL (ref 3.87–5.11)
RDW: 13.3 % (ref 11.5–15.5)
WBC: 9.8 10*3/uL (ref 4.0–10.5)

## 2014-09-28 LAB — BASIC METABOLIC PANEL
Anion gap: 9 (ref 5–15)
BUN: 14 mg/dL (ref 6–20)
CO2: 23 mmol/L (ref 22–32)
CREATININE: 0.88 mg/dL (ref 0.44–1.00)
Calcium: 9.3 mg/dL (ref 8.9–10.3)
Chloride: 106 mmol/L (ref 101–111)
GFR calc Af Amer: >60 mL/min (ref >60)
GFR calc non Af Amer: >60 mL/min (ref >60)
Glucose, Bld: 157 mg/dL — ABNORMAL HIGH (ref 65–99)
Potassium: 4.3 mmol/L (ref 3.5–5.1)
Sodium: 138 mmol/L (ref 135–145)

## 2014-09-28 NOTE — ED Notes (Signed)
Pt sent from urgent care, took husband's Flecainide 100mg  today at 0700, EKG changes noted at urgent care.

## 2014-09-28 NOTE — ED Provider Notes (Signed)
CSN: [id number]     Arrival date & time 09/28/14  1428 History   First MD Initiated Contact with Patient 09/28/14 1702     Chief Complaint  Patient presents with  . Ingestion     (Consider location/radiation/quality/duration/timing/severity/associated sxs/prior Treatment) HPI  64 yo F w/ dementia, ADD who accidentally took a dose of her husbands flecainide this AM around 0700, around 1030 her caregiver contacted him and told him she didn't look well and was pale. He took her to urgent care where she had a HR reportedly in the 30's. ECG shown to me showed 57. Sent here for further eval. Is now improved and symptom free aside from intermittent sore throat when swallowing. No sob, ams, edema, chest pain or other associated symptoms.   Past Medical History  Diagnosis Date  . Scoliosis   . Osteoporosis   . History of anemia   . ADD (attention deficit disorder with hyperactivity)     adult  . Depression   . Osteopenia 2002    dexa  . Alzheimer's disease, early onset    Past Surgical History  Procedure Laterality Date  . Cesarean section      x 3  . Nasal sinus surgery    . Breast enhancement surgery    . Stress myoview normal  06/06  . Pelvic ultrasound  05/10    thickened endometrium and ? polyp vs fibroid  . Dilation and curettage of uterus  2015   Family History  Problem Relation Age of Onset  . Heart disease Mother     CAD  . Alzheimer's disease Mother     Died, 28  . Heart disease Father      CAD MI at age 78  . Hypertension Father   . Hypertension Sister   . Cancer Other     leukemia  . Diabetes Sister   . Cancer Other     breast   Social History  Substance Use Topics  . Smoking status: Former Smoker    Quit date: 09/27/1979  . Smokeless tobacco: Never Used  . Alcohol Use: 0.0 oz/week    0 Standard drinks or equivalent per week     Comment: occassionally   OB History    No data available     Review of Systems  Constitutional: Negative for fever and  chills.  HENT: Positive for sore throat. Negative for drooling and ear pain.   Eyes: Negative for pain.  Respiratory: Negative for choking, shortness of breath, wheezing and stridor.   Gastrointestinal: Negative for abdominal pain and blood in stool.  Musculoskeletal: Negative for back pain, neck pain and neck stiffness.  Skin: Negative for color change.  Psychiatric/Behavioral: Negative for confusion and agitation.  All other systems reviewed and are negative.     Allergies  Gluten meal; Other; and Tetracycline hcl  Home Medications   Prior to Admission medications   Medication Sig Start Date End Date Taking? Authorizing Provider  amphetamine-dextroamphetamine (ADDERALL XR) 20 MG 24 hr capsule Take 1 capsule (20 mg total) by mouth every morning. Fill on/after 10/11/14 07/10/14  Yes Tonia Ghent, MD  ARIPiprazole ([name]) 5 MG tablet Take 0.5 tablets (2.5 mg total) by mouth daily. 07/10/14  Yes Tonia Ghent, MD  buPROPion (WELLBUTRIN XL) 300 MG 24 hr tablet TAKE ONE TABLET BY MOUTH EVERY DAY 05/19/14  Yes Tonia Ghent, MD  citalopram (CELEXA) 40 MG tablet TAKE 1 TABLET BY MOUTH DAILY 10/24/13  Yes Tonia Ghent, MD  donepezil (ARICEPT) 23 MG TABS tablet Take 1 tablet (23 mg total) by mouth at bedtime. 02/19/14  Yes Donika Keith Rake, DO  Melatonin 3 MG CAPS Take 9 mg by mouth at bedtime.   Yes Historical Provider, MD  memantine (NAMENDA) 10 MG tablet Take 1 tablet (10 mg total) by mouth 2 (two) times daily. Patient taking differently: Take 10 mg by mouth daily.  02/19/14  Yes Donika K Patel, DO  Multiple Vitamins-Minerals (HIGH POTENCY MULTIVIT/MIN/IRON) TABS Take 1 tablet by mouth 2 (two) times daily.    Yes Historical Provider, MD  NATURE-THROID 65 MG tablet Take 652 mg by mouth daily.  02/04/14  Yes Historical Provider, MD  PREMARIN 1.25 MG tablet Take 1 tablet by mouth daily. 09/22/14  Yes Historical Provider, MD  progesterone (PROMETRIUM) 200 MG capsule Take 1 capsule by mouth at  bedtime. 09/22/14  Yes Historical Provider, MD  clobetasol (OLUX) 0.05 % topical foam Apply 1 application topically daily as needed. For dry skin 09/25/14   Historical Provider, MD  Clobetasol Propionate 0.05 % shampoo Apply 1 application topically as needed. For scalp 09/28/14   Historical Provider, MD  furosemide (LASIX) 40 MG tablet Take 1 tablet (40 mg total) by mouth daily. Patient not taking: Reported on 09/28/2014 07/09/14   Venia Carbon, MD  mupirocin ointment (BACTROBAN) 2 % Apply 1 application topically daily as needed. Dry/irritated skin 09/25/14   Historical Provider, MD   BP 137/67 mmHg  Pulse 62  Temp(Src) 98.1 F (36.7 C) (Oral)  Resp 24  Ht 5\' 2"  (1.575 m)  Wt 173 lb (78.472 kg)  BMI 31.63 kg/m2  SpO2 95% Physical Exam  Constitutional: She is oriented to person, place, and time. She appears well-developed and well-nourished.  HENT:  Head: Normocephalic and atraumatic.  Eyes: Conjunctivae and EOM are normal. Right eye exhibits no discharge. Left eye exhibits no discharge.  Cardiovascular: Normal rate and regular rhythm.   Pulmonary/Chest: Effort normal and breath sounds normal. No respiratory distress.  Abdominal: Soft. She exhibits no distension. There is no tenderness. There is no rebound.  Musculoskeletal: Normal range of motion. She exhibits no edema or tenderness.  Neurological: She is alert and oriented to person, place, and time.  Skin: Skin is warm and dry.  Nursing note and vitals reviewed.   ED Course  Procedures (including critical care time) Labs Review Labs Reviewed  BASIC METABOLIC PANEL - Abnormal; Notable for the following:    Glucose, Bld 157 (*)    All other components within normal limits  CBC  I-STAT TROPOININ, ED    Imaging Review No results found. I have personally reviewed and evaluated these images and lab results as part of my medical decision-making.   EKG Interpretation   Date/Time:  Monday September 28 2014 18:47:29  EDT Ventricular Rate:  68 PR Interval:  131 QRS Duration: 86 QT Interval:  416 QTC Calculation: 442 R Axis:   68 Text Interpretation:  Sinus rhythm Probable left atrial enlargement Low  voltage, extremity leads Confirmed by [facility] MD, Corene Cornea [id number]) on  09/28/2014 6:58:52 PM      MDM   Final diagnoses:  Ingestion of substance, initial encounter   HR normal here. ECG ok with normal intervals.   On further research flecainide has a time to peak onset approximately 3 hours as long as 6 hours. This one sides with her symptom onset. Since being here for approximately 4 hours she has not had any bradycardia, worsening QRS widening, PR interval  prolongation, hypotension or symptoms. I feel like she is out of the window for any adverse event secondary to this ingestion and she is safe to be discharged home.  I have personally and contemperaneously reviewed labs and imaging and used in my decision making as above.   A medical screening exam was performed and I feel the patient has had an appropriate workup for their chief complaint at this time and likelihood of emergent condition existing is low. They have been counseled on decision, discharge, follow up and which symptoms necessitate immediate return to the emergency department. They or their family verbally stated understanding and agreement with plan and discharged in stable condition.      Merrily Pew, MD 09/28/14 1902

## 2014-10-06 ENCOUNTER — Encounter: Payer: Self-pay | Admitting: Family Medicine

## 2014-10-06 ENCOUNTER — Ambulatory Visit (INDEPENDENT_AMBULATORY_CARE_PROVIDER_SITE_OTHER): Payer: BLUE CROSS/BLUE SHIELD | Admitting: Family Medicine

## 2014-10-06 VITALS — BP 108/58 | HR 79 | Temp 98.3°F | Wt 166.0 lb

## 2014-10-06 DIAGNOSIS — J069 Acute upper respiratory infection, unspecified: Secondary | ICD-10-CM | POA: Insufficient documentation

## 2014-10-06 MED ORDER — AMPHETAMINE-DEXTROAMPHET ER 20 MG PO CP24: 20.0000 mg | ORAL_CAPSULE | Freq: Every day | ORAL | Status: DC

## 2014-10-06 MED ORDER — AMPHETAMINE-DEXTROAMPHET ER 20 MG PO CP24: 20.0000 mg | ORAL_CAPSULE | ORAL | Status: DC

## 2014-10-06 MED ORDER — MEMANTINE HCL 10 MG PO TABS
10.0000 mg | ORAL_TABLET | Freq: Every day | ORAL | Status: DC
Start: 2014-10-06 — End: 2015-01-08

## 2014-10-06 MED ORDER — AMOXICILLIN-POT CLAVULANATE 875-125 MG PO TABS: 1.0000 | ORAL_TABLET | Freq: Two times a day (BID) | ORAL | Status: DC

## 2014-10-06 NOTE — Assessment & Plan Note (Signed)
Hold augmentin for now.  She may be temporarily better from the aspirin vs truly improving on her own.  If more headaches and continued symptoms, then start the antibiotics.  Rest and fluids in the meantime.  Nontoxic. Fu prn.   She and husband agree.

## 2014-10-06 NOTE — Addendum Note (Signed)
Addended by: Tonia Ghent on: 10/06/2014 02:42 PM   Modules accepted: Orders, Medications

## 2014-10-06 NOTE — Patient Instructions (Signed)
Hold the antibiotics for now.  You may be temporarily better from the aspirin vs truly improving on your own.  If more headaches and continued symptoms, then start the antibiotics.  Rest and fluids in the meantime.  Take care.  Glad to see you.

## 2014-10-06 NOTE — Progress Notes (Addendum)
She had an accidental ingestion of flecanide last week.  Was seen at ER.  No sig ADE overall.  She didn't need admission.  Her sx are resolved.  We talked about procedures to keep her meds separate.    Cold sx.  Possible exposure at the ER recently.   duration of symptoms:  ~1 week Rhinorrhea: some Congestion: some ear pain: no  sore throat: some Cough: some, more at night.   Myalgias: no HA recently noted but she couldn't give a lot of details.  H/o sinus surgery in the distant past.  Some better with aspirin now.  Hx per patient some, mostly by husband.   No fevers. Taking nyquil for the last ~3 nights, dayquil during the day, with some help with sleep generally.    She needed refills done on adderall rx, with fill on/after dates.  Done and given to patient/husband.   ROS: See HPI.  Otherwise negative.    Meds, vitals, and allergies reviewed.   GEN: nad, alert and oriented HEENT: mucous membranes moist, TM w/o erythema, nasal epithelium injected, OP with mild cobblestoning, sinuses not ttp x4 NECK: supple w/o LA CV: rrr. PULM: ctab, no inc wob ABD: soft, +bs EXT: no edema

## 2014-10-16 ENCOUNTER — Encounter: Payer: Self-pay | Admitting: Family Medicine

## 2014-10-16 ENCOUNTER — Ambulatory Visit (INDEPENDENT_AMBULATORY_CARE_PROVIDER_SITE_OTHER): Payer: BLUE CROSS/BLUE SHIELD | Admitting: Family Medicine

## 2014-10-16 VITALS — BP 100/68 | HR 74 | Temp 98.1°F | Wt 167.0 lb

## 2014-10-16 DIAGNOSIS — Z23 Encounter for immunization: Secondary | ICD-10-CM

## 2014-10-16 DIAGNOSIS — R451 Restlessness and agitation: Secondary | ICD-10-CM | POA: Diagnosis not present

## 2014-10-16 NOTE — Patient Instructions (Signed)
I wouldn't change anything for now.  This could be from adderall or from anxiety.  If you have another episode, then I would skip the adderall the next day thereafter.  Update me as needed.  Take care.  Glad to see you.

## 2014-10-16 NOTE — Progress Notes (Signed)
Pre visit review using our clinic review tool, if applicable. No additional management support is needed unless otherwise documented below in the visit note.  ~1 week ago, husband noted agitation and sweats on the patient.  She was repeatedly fixing/fiddling with items at home. Sx were clearly better in the nights, then worse again the next day.  Sx better today.  No other med changes.  She was clearly more relaxed at night.  Sweats were diffuse, not just regional.  Unclear if this was from anxiety.  Had a B tremor with the events, but then better again at night.  She has difficulty maintaining good sleep hygiene overall, but even this wouldn't typically explain her sx- she had some good nights of sleep this week.  Unclear if due to anxiety related to her husband being out of the home on prev work days- he was at home today. No dysuria. No fevers.   Meds, vitals, and allergies reviewed.   ROS: See HPI.  Otherwise, noncontributory.  GEN: nad, alert and oriented HEENT: mucous membranes moist, tm wnl, nasal and OP exam wnl NECK: supple w/o LA CV: rrr. PULM: ctab, no inc wob ABD: soft, +bs EXT: no edema CN 2-12 wnl B, S/S wnl x4 except for faint B hand tremor noted with arms in extension.  No tremor noted when hands at rest in the chair.

## 2014-10-16 NOTE — Assessment & Plan Note (Signed)
With sweats.  Improved now.  Still with minimal tremor.  D/w pt and husband.   Could be from anxiety related to husband leaving for work.  Could be from stimulant med.  Wouldn't change anything now given the improvement today.   Observe for now.  Would hold stimulant med if sx noted to return.   Agree to update me as needed.  Okay for outpatient f/u.

## 2014-11-16 ENCOUNTER — Other Ambulatory Visit: Payer: Self-pay | Admitting: Family Medicine

## 2014-11-16 NOTE — Telephone Encounter (Signed)
Received refill request electronically from pharmacy Last office visit 10/16/14 Last refill 10/24/13 #30/12 See drug warning with Bupropion Is it okay to refill?

## 2014-11-17 NOTE — Telephone Encounter (Signed)
Would continue, sent. Thanks.

## 2014-11-23 ENCOUNTER — Other Ambulatory Visit: Payer: Self-pay | Admitting: Family Medicine

## 2014-12-01 ENCOUNTER — Telehealth: Payer: Self-pay

## 2014-12-01 NOTE — Telephone Encounter (Signed)
Please call pharmacy and see if they'll fill early.  This is reasonable to fill early.   Encourage him to secure the med bottles.  Thanks.

## 2014-12-01 NOTE — Telephone Encounter (Signed)
Mr Brazel left v/m;pt has early alzheimers and pt had citalopram and bupropion filled about 1 - 1 1/2 week ago; pt has moved the meds and Mr Luebbers cannot find the med (he said pt often hides meds). Mr Ivins request Dr Damita Dunnings to call [facility] to fill meds early. Mr Wagle request cb.

## 2014-12-01 NOTE — Telephone Encounter (Signed)
Pharmacy contacte and situation explained.  Pharmacy says they will contact the patient's insurance plan to see if they will allow for an early refill (s) and let the patient's husband know.

## 2015-01-08 ENCOUNTER — Encounter: Payer: Self-pay | Admitting: Neurology

## 2015-01-08 ENCOUNTER — Ambulatory Visit (INDEPENDENT_AMBULATORY_CARE_PROVIDER_SITE_OTHER): Payer: BLUE CROSS/BLUE SHIELD | Admitting: Neurology

## 2015-01-08 VITALS — BP 132/76 | HR 83 | Ht 62.0 in | Wt 155.0 lb

## 2015-01-08 DIAGNOSIS — F0280 Dementia in other diseases classified elsewhere without behavioral disturbance: Secondary | ICD-10-CM

## 2015-01-08 DIAGNOSIS — G300 Alzheimer's disease with early onset: Secondary | ICD-10-CM

## 2015-01-08 MED ORDER — MEMANTINE HCL 10 MG PO TABS: 10.0000 mg | ORAL_TABLET | Freq: Two times a day (BID) | ORAL | Status: DC

## 2015-01-08 MED ORDER — DONEPEZIL HCL 23 MG PO TABS: 23.0000 mg | ORAL_TABLET | Freq: Every day | ORAL | Status: DC

## 2015-01-08 NOTE — Progress Notes (Signed)
[facility]  Follow-up Visit   Date: 01/08/2015    Lisa Mclaughlin MRN: [id number] DOB: September 27, 1950   Interim History: Lisa Mclaughlin is a 65 y.o. year-old right-handed Caucasian female of [place] ancestry with history of ADD (diagnosed, 86s) and depression (diagnosed 2012) returning for follow-up of early onset Alzheimer's dementia.  The patient was accompanied to the clinic by her husband.   History of present illness: Husband reports that memory problems have been ongoing since ~2010, described as impairments with short-term memory, misplacing things, and trying to complete tasks. However, in March of 2013, her symptoms dramatically worsened and his behavior concerned her husband. He says that she started acting like she was in a dream world. She became very depressed and obsessed with childhood memorys where she was bullied as a young girl. She became paranoid that these girls were coming after her because of unpaid club dues. She started seeing a psychologist, Dr. Terrance Mass, who diagnosed her with depression. Several medication adjustments have been made and her depression is almost 100% better but her memory remains impaired.   She is forgetting short-term memory of events, dates, and names. She feels that she is still able to function independently and does all of her ADLs. She stopped driving in [id number]. If she focuses on things, she is able to recall things better.  - Follow-up 01/28/2013:  She feels that things have changed for the better because attitude is better.  They are working to replicate supplements published in a [employer] study for dietary supplements in Alzheimer's disease.  They are also seeing integrative therapy from [facility] and staying active in an exercise program.   - Follow-up 04/29/2013:  Her husband feels that she is stable, if not a little better.  For the first time, she worked on a crossword puzzle and has been reading more.  She is eager to  leave the home and engage in activities.  Dr. Audria Nine at [facility] and has been started on other supplements.  She is also planning on having her dental fillings and was tested positive for several food allergies.  - Follow-up 07/29/2013:  He saw Dr. Werner Lean at [facility] who agreed with diagnosis of Alzheimer's dementia. Because of their interest in clinical trials related to supplements, he was give information on a trial in [facility]) which is the same that they are trying to replicate at home.  He recommended tapering the namenda but there was mild worsening in memory, so he restarted it. She feels that her memory is relatively stable.  They have increased their activity and they did their fun yoga class last night.  Mood continues to stay positive.  She has since had 7 teeth removed and is doing oral chelation through Dr. Verlon Au clinic.  UPDATE 02/19/2014:  Clinical trial that they were hoping to enroll has not started yet.  Overall, they feel she has slipped a little but attitude remains good.  She reports having less interests and no longer works on crossword puzzles or goes to yoga class. Her mood has been great, no concerns with depression.  She spends her day watching TV, reading, and taking naps.  She is able to make the bed and tidy around the home some, but unable to do other household chores such as cleaning, laundry, and cooking.      UPDATE 01/07/2014:  There continues to be gradual decline in her memory.  Husband reports late afternoons can be more difficult  because she can forget who her husband is and that their are married.  She can be agitated some days.  They have started to use light music in the evenings which has helped. Bathing is difficult because she does not always want to take a bath.  In May, they started having a caregiver come 4-days per week staying 10-hours per day.  She does not have many hobbies and engages socially as needed.  They tried  an exercise program, but this was unsuccessful. Mood is "fine".  No hallucinations or bizarre behavior.  Sleep is significantly improved since using light evening music.  They have since stopped all other supplements.    Medications:  Current Outpatient Prescriptions on File Prior to Visit  Medication Sig Dispense Refill  . amphetamine-dextroamphetamine (ADDERALL XR) 20 MG 24 hr capsule Take 1 capsule (20 mg total) by mouth daily. 30 capsule 0  . ARIPiprazole ([name]) 5 MG tablet Take 0.5 tablets (2.5 mg total) by mouth daily.    Marland Kitchen buPROPion (WELLBUTRIN XL) 300 MG 24 hr tablet TAKE 1 TABLET BY MOUTH EVERY DAY 30 tablet 3  . citalopram (CELEXA) 40 MG tablet TAKE 1 TABLET BY MOUTH EVERY DAY 30 tablet 5  . Melatonin 3 MG CAPS Take 9 mg by mouth at bedtime.    . Multiple Vitamins-Minerals (HIGH POTENCY MULTIVIT/MIN/IRON) TABS Take 1 tablet by mouth 2 (two) times daily.     Marland Kitchen NATURE-THROID 65 MG tablet Take 130 mg by mouth daily.   5  . PREMARIN 1.25 MG tablet Take 1 tablet by mouth daily.  12  . progesterone (PROMETRIUM) 200 MG capsule Take 1 capsule by mouth at bedtime.  12   No current facility-administered medications on file prior to visit.    Allergies:  Allergies  Allergen Reactions  . Gluten Meal Other (See Comments)  . Other Other (See Comments)    Food allergies: almond, banana, casein, cheese, cola, egg white, flaxseed, gluten, malt, cow and goat milks, mushrooms, pineapple, salmon, sesame, [place], wheat, whey, bakers and brewers yeast, yogurt.   . Tetracycline Hcl      Review of Systems:  CONSTITUTIONAL: No fevers, chills, night sweats, or weight loss.   EYES: No visual changes or eye pain ENT: No hearing changes.  No history of nose bleeds.   RESPIRATORY: No cough, wheezing and shortness of breath.   CARDIOVASCULAR: Negative for chest pain, and palpitations.   GI: Negative for abdominal discomfort, blood in stools or black stools.  No recent change in bowel habits.   GU:   No history of incontinence.   MUSCLOSKELETAL: No history of joint pain or swelling.  No myalgias.   SKIN: Negative for lesions, rash, and itching.   HEMATOLOGY/ONCOLOGY: Negative for prolonged bleeding, bruising easily, and swollen nodes.    ENDOCRINE: Negative for cold or heat intolerance, polydipsia or goiter.   PSYCH:  No depression or anxiety symptoms.   NEURO: As Above.   Vital Signs:  BP 132/76 mmHg  Pulse 83  Ht 5\' 2"  (1.575 m)  Wt 155 lb (70.308 kg)  BMI 28.34 kg/m2  SpO2 94%  Neurological Exam: Montreal Cognitive Assessment  01/08/2015 02/19/2014  Visuospatial/ Executive (0/5) 1 1  Naming (0/3) 3 3  Attention: Read list of digits (0/2) 1 2  Attention: Read list of letters (0/1) 0 0  Attention: Serial 7 subtraction starting at 100 (0/3) 0 0  Language: Repeat phrase (0/2) 1 2  Language : Fluency (0/1) 0 0  Abstraction (0/2) 0  0  Delayed Recall (0/5) 0 0  Orientation (0/6) 1 1  Total 7 9  Adjusted Score (based on education) 7 9   Alert: normal  Oriented: to self  Dressed:well-groomed   Eye Contact:normal, but reduced blink   Facial Expression: Appropriate  Psychomotor agitation: none  Psychomotor retardation: some, difficulty with step-step commands.  Speech/Language: normal, perseverates "OK"    Mood: Good  Affect: Blunted at times, but appropriately cheerful   Cranial nerves:  Pupils round and reactive bilaterally.  Extraocular muscles intact.  Face is symmetric.  Tongue is midline.  Palate elevates symmetrically.  Bilateral palmomental reflex is present.   Motor strength:  5/5 in all extremities, normal tone  Reflexes:  Reflexes are brisk 3+ in the upper extremities and 2+ in the lower extremities bilaterally.    Gait:  Finger tapping and heel taping intact. No dysmetria.  Reduced arm swing bilaterally, normal posture, stable and narrow-based gait.  Data: MRI brain 10/01/2012: Premature for age cerebral and cerebellar atrophy. Chronic microvascular ischemic change  affecting the periventricular greater than subcortical white matter.  CT brain 06/07/2012: Mild premature atrophy. Chronic microvascular ischemic change. No acute intracranial findings.  Neuropsychiatric testing 08/09/2012, 09/05/2012: Serious deficits with in multiple cognitive domains suggestive of a severe global cognitive decline that far exceed what might be expected on the basis of depression and ADHD. 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  Labs:  05/24/2012:  TSH 0.53, B12 622  09/26/2012:  Vitamin E 0.8   IMPRESSION/PLAN: 1.  Early onset Alzheimer's dementia, moderate to severe  - Clinically worsening as per the natural progression of the disease  - She is able to do basic ADLs (bathe, feed, dress), and depends on assistance for all other tasks.  - No behavioral or home safety issues, there is a caregiver that comes 4-days per week and they are managing well with her at home  - Continue Aricept 23mg  daily   - Increase Namenda to 10mg  twice daily  2.  Depression  - Continue regimen as per psychiatrist  Return to clinic in 1 year   The duration of this appointment visit was 25 minutes of face-to-face time with the patient.  Greater than 50% of this time was spent in counseling, explanation of diagnosis, planning of further management, and coordination of care.   Thank you for allowing me to participate in patient's care.  If I can answer any additional questions, I would be pleased to do so.    Sincerely,    Kalab Camps K. Posey Pronto, DO

## 2015-01-25 ENCOUNTER — Telehealth: Payer: Self-pay | Admitting: Family Medicine

## 2015-01-25 NOTE — Telephone Encounter (Signed)
[facility]  Patient Name: Lisa Mclaughlin  DOB: 02/12/1950    Initial Comment Caller States: wife is sick, , recently starting have bad diarrhea, headache, cramps, she is early on set of Alzheimer. Stated he does have a POA to speak on her behalf   Nurse Assessment      Guidelines    Guideline Title Affirmed Question Affirmed Notes       Final Disposition User   FINAL ATTEMPT MADE - no message left Harlow Mares, Therapist, sports, Suanne Marker

## 2015-01-26 ENCOUNTER — Telehealth: Payer: Self-pay | Admitting: *Deleted

## 2015-01-26 NOTE — Telephone Encounter (Signed)
Fax received from [facility] for PA on D-Amphetamine.  Submitted thru [facility], awaiting results.

## 2015-01-26 NOTE — Telephone Encounter (Signed)
Noted. Thanks.

## 2015-01-26 NOTE — Telephone Encounter (Signed)
Mr Zora said pt is feeling much better; diarrhea stopped last night, no headache. Mr Donatelli wants to observe pt today and if condition changes will cb for appt. Mr Yox said he did not get cb last night from [facility]; per [facility] note wrong contact # [phone] was listed on TH note. Apologized to Mr Polito and he voiced understanding.

## 2015-01-27 NOTE — Telephone Encounter (Signed)
See letter from [facility] placed on your desk

## 2015-01-28 MED ORDER — AMPHETAMINE-DEXTROAMPHET ER 20 MG PO CP24: 20.0000 mg | ORAL_CAPSULE | Freq: Every day | ORAL | Status: DC

## 2015-01-28 NOTE — Telephone Encounter (Addendum)
Patient advised. Rx left at front desk for pick up.  Letter from [facility] scanned.

## 2015-01-28 NOTE — Addendum Note (Signed)
Addended by: Tonia Ghent on: 01/28/2015 12:45 AM   Modules accepted: Orders, Medications

## 2015-01-28 NOTE — Telephone Encounter (Signed)
Looks like they'll cover adderall xr 20mg .  Printed.  Thanks.

## 2015-02-04 ENCOUNTER — Other Ambulatory Visit: Payer: Self-pay | Admitting: Obstetrics & Gynecology

## 2015-02-04 DIAGNOSIS — N950 Postmenopausal bleeding: Secondary | ICD-10-CM | POA: Diagnosis not present

## 2015-02-04 DIAGNOSIS — N840 Polyp of corpus uteri: Secondary | ICD-10-CM | POA: Diagnosis not present

## 2015-02-04 DIAGNOSIS — R938 Abnormal findings on diagnostic imaging of other specified body structures: Secondary | ICD-10-CM | POA: Diagnosis not present

## 2015-02-04 DIAGNOSIS — N8500 Endometrial hyperplasia, unspecified: Secondary | ICD-10-CM | POA: Diagnosis not present

## 2015-03-05 ENCOUNTER — Other Ambulatory Visit: Payer: Self-pay

## 2015-03-05 NOTE — Telephone Encounter (Signed)
Pt's husband Louie Casa [facility] signed) left v/m requesting rx for Adderall. Call when ready for pick up. rx last printed # 30 on 01/28/15; last f/u 07/10/2014.

## 2015-03-07 MED ORDER — AMPHETAMINE-DEXTROAMPHET ER 20 MG PO CP24: 20.0000 mg | ORAL_CAPSULE | Freq: Every day | ORAL | Status: DC

## 2015-03-07 NOTE — Telephone Encounter (Signed)
Printed.  Due for f/u this summer.  Thanks.

## 2015-03-08 NOTE — Telephone Encounter (Signed)
Husband advised.  Rx left at front desk for pick up.

## 2015-04-07 ENCOUNTER — Other Ambulatory Visit: Payer: Self-pay | Admitting: Family Medicine

## 2015-05-05 ENCOUNTER — Other Ambulatory Visit: Payer: Self-pay | Admitting: Family Medicine

## 2015-05-14 DIAGNOSIS — H353131 Nonexudative age-related macular degeneration, bilateral, early dry stage: Secondary | ICD-10-CM | POA: Diagnosis not present

## 2015-05-14 DIAGNOSIS — H2513 Age-related nuclear cataract, bilateral: Secondary | ICD-10-CM | POA: Diagnosis not present

## 2015-05-14 DIAGNOSIS — Z0100 Encounter for examination of eyes and vision without abnormal findings: Secondary | ICD-10-CM | POA: Diagnosis not present

## 2015-06-07 ENCOUNTER — Other Ambulatory Visit: Payer: Self-pay | Admitting: Family Medicine

## 2015-06-18 ENCOUNTER — Ambulatory Visit (INDEPENDENT_AMBULATORY_CARE_PROVIDER_SITE_OTHER): Payer: Medicare Other | Admitting: Family Medicine

## 2015-06-18 ENCOUNTER — Encounter: Payer: Self-pay | Admitting: Family Medicine

## 2015-06-18 VITALS — BP 122/72 | HR 69 | Temp 98.4°F | Wt 153.5 lb

## 2015-06-18 DIAGNOSIS — R413 Other amnesia: Secondary | ICD-10-CM

## 2015-06-18 DIAGNOSIS — G259 Extrapyramidal and movement disorder, unspecified: Secondary | ICD-10-CM | POA: Diagnosis not present

## 2015-06-18 LAB — CBC WITH DIFFERENTIAL/PLATELET
BASOS PCT: 0 %
Basophils Absolute: 0 cells/uL (ref 0–200)
EOS ABS: 0 {cells}/uL — AB (ref 15–500)
EOS PCT: 0 %
HCT: 44.5 % (ref 35.0–45.0)
Hemoglobin: 15.3 g/dL (ref 11.7–15.5)
LYMPHS PCT: 19 %
Lymphs Abs: 1406 cells/uL (ref 850–3900)
MCH: 31.2 pg (ref 27.0–33.0)
MCHC: 34.4 g/dL (ref 32.0–36.0)
MCV: 90.6 fL (ref 80.0–100.0)
MONOS PCT: 9 %
MPV: 10.2 fL (ref 7.5–12.5)
Monocytes Absolute: 666 cells/uL (ref 200–950)
NEUTROS ABS: 5328 {cells}/uL (ref 1500–7800)
Neutrophils Relative %: 72 %
PLATELETS: 319 10*3/uL (ref 140–400)
RBC: 4.91 MIL/uL (ref 3.80–5.10)
RDW: 13.3 % (ref 11.0–15.0)
WBC: 7.4 10*3/uL (ref 3.8–10.8)

## 2015-06-18 LAB — COMPREHENSIVE METABOLIC PANEL
ALBUMIN: 4 g/dL (ref 3.6–5.1)
ALK PHOS: 76 U/L (ref 33–130)
ALT: 18 U/L (ref 6–29)
AST: 18 U/L (ref 10–35)
BILIRUBIN TOTAL: 0.4 mg/dL (ref 0.2–1.2)
BUN: 12 mg/dL (ref 7–25)
CALCIUM: 9.6 mg/dL (ref 8.6–10.4)
CO2: 21 mmol/L (ref 20–31)
Chloride: 103 mmol/L (ref 98–110)
Creat: 0.82 mg/dL (ref 0.50–0.99)
Glucose, Bld: 98 mg/dL (ref 65–99)
Potassium: 4.1 mmol/L (ref 3.5–5.3)
Sodium: 137 mmol/L (ref 135–146)
TOTAL PROTEIN: 6.8 g/dL (ref 6.1–8.1)

## 2015-06-18 LAB — TSH: TSH: 0.13 m[IU]/L — AB

## 2015-06-18 NOTE — Patient Instructions (Addendum)
Go to the lab on the way out.  We'll contact you with your lab report. We may need to get the sleep study set up after I see your labs.  Stop doxycycline for now.  Take care.  Glad to see you.

## 2015-06-18 NOTE — Progress Notes (Signed)
Pre visit review using our clinic review tool, if applicable. No additional management support is needed unless otherwise documented below in the visit note.  She feels well, per her report.   No FCNAVD.  Mood is good per patient.  Husband agrees.  "I think my memory is okay."  Husband is noting more changes in her memory.  She had been asking him for more instruction and orientation with the activity of the moment.  No motor changes.  Sleep is disordered.  Tried melatonin w/o relief prev.  Tried dropping her adderall in the meantime to help sleep. Sleep wasn't better but attention wasn't worse either.  Off med now.  She has trouble staying asleep, husband has noted arm movements nighttime and daytime, not clearly worse in day or night.  B arm quick movements, unclear if going on both sides at the same time.  Noted since last OV.  They don't appear purposeful.  Clearly noted more off adderall, but not clear if stopping the med caused the sx.  Some occ snoring.  No known apneas.    In the last month, she has noted to have more anxiety.  Unclear triggers, other than seeing bad news on TV.  Worse in the last month, she doesn't recall most of them.  She can get panicked, cold and sweaty with the episodes.  She'll get better in about 15-20 minutes on her own.  Soft classical music helps some.    Had been on doxy per outside clinic.  Doesn't need to continue, d/w pt and husband.    Meds, vitals, and allergies reviewed.   ROS: Per HPI unless specifically indicated in ROS section   GEN: nad, alert and but not oriented, is pleasant in conversation HEENT: mucous membranes moist NECK: supple w/o LA CV: rrr.  no murmur PULM: ctab, no inc wob ABD: soft, +bs EXT: no edema SKIN: no acute rash CN 2-12 wnl B, S/S/DTR wnl x4 but L>R hand tremor noted only with finger to nose testing. No tremor o/w.

## 2015-06-20 NOTE — Assessment & Plan Note (Signed)
Memory is worse based on history.  Now with arm movements day and night, possible startle reflex.  Also with finger to nose testing showing a L>R hand tremor.  Off adderall.  Would stop doxy.  Recheck labs today.  She may end up needing OSA eval given the nighttime arm movements (though also noted in the daytime) and I'll likely need input from Dr. Posey Pronto re: the intention tremor.   See notes on labs.   >25 minutes spent in face to face time with patient, >50% spent in counselling or coordination of care.

## 2015-06-21 ENCOUNTER — Other Ambulatory Visit: Payer: Self-pay | Admitting: Family Medicine

## 2015-06-21 DIAGNOSIS — R7989 Other specified abnormal findings of blood chemistry: Secondary | ICD-10-CM

## 2015-06-22 ENCOUNTER — Encounter: Payer: Self-pay | Admitting: Neurology

## 2015-06-22 ENCOUNTER — Ambulatory Visit (INDEPENDENT_AMBULATORY_CARE_PROVIDER_SITE_OTHER): Payer: Medicare Other | Admitting: Neurology

## 2015-06-22 VITALS — BP 120/80 | HR 74 | Ht 62.0 in | Wt 155.1 lb

## 2015-06-22 DIAGNOSIS — F0280 Dementia in other diseases classified elsewhere without behavioral disturbance: Secondary | ICD-10-CM

## 2015-06-22 DIAGNOSIS — G300 Alzheimer's disease with early onset: Secondary | ICD-10-CM | POA: Diagnosis not present

## 2015-06-22 DIAGNOSIS — F518 Other sleep disorders not due to a substance or known physiological condition: Secondary | ICD-10-CM

## 2015-06-22 DIAGNOSIS — G252 Other specified forms of tremor: Secondary | ICD-10-CM | POA: Diagnosis not present

## 2015-06-22 NOTE — Patient Instructions (Addendum)
1.  Stop taking thyroid supplements 2.  Start taking melatonin 3mg  three hours before bedtime 3.  If your movements become more bothersome, please call my office and we can start primidone 50mg  at bedtime  Return to clinic in 3 months

## 2015-06-22 NOTE — Progress Notes (Signed)
[facility]  Follow-up Visit   Date: 06/22/2015    Lisa Mclaughlin MRN: [id number] DOB: 11/11/1950   Interim History: Lisa Mclaughlin is a 65 y.o. year-old right-handed Caucasian female of English ancestry with history of ADD (diagnosed, 1990s) and depression (diagnosed 2012) returning for new complaints of tremor.  The patient was accompanied to the clinic by her husband.   History of present illness: Husband reports that memory problems have been ongoing since ~2010, described as impairments with short-term memory, misplacing things, and trying to complete tasks. However, in March of 2013, her symptoms dramatically worsened and his behavior concerned her husband. He says that she started acting like she was in a dream world. She became very depressed and obsessed with childhood memories where she was bullied as a young girl. She became paranoid that these girls were coming after her because of unpaid club dues. She started seeing a psychologist, Dr. Terrance Mass, who diagnosed her with depression. Several medication adjustments have been made and her depression is almost 100% better but her memory remains impaired. She is forgetting short-term memory of events, dates, and names. She feels that she is still able to function independently and does all of her ADLs. She stopped driving in [id number]. If she focuses on things, she is able to recall things better.  In 2015, patient and her husband researched options and worked to supplements published in a [employer] study for dietary supplements in Alzheimer's disease.  They are also seeing integrative therapy from [facility] and staying active in an exercise program. She also started seeing Dr. Audria Nine at [facility] and was been started on other supplements. In the Spring of 2015, she saw Dr. Werner Lean at [facility] who agreed with diagnosis of Alzheimer's dementia. Because of their interest in clinical trials  related to supplements, he was give information on a trial in [facility]) which is the same that they are trying to replicate at home.  He recommended tapering the namenda but there was mild worsening in memory, so he restarted it.   UPDATE 02/19/2014:  Clinical trial that they were hoping to enroll has not started yet.  Overall, they feel she has slipped a little but attitude remains good.  She reports having less interests and no longer works on crossword puzzles or goes to yoga class. Her mood has been great, no concerns with depression.  She spends her day watching TV, reading, and taking naps.  She is able to make the bed and tidy around the home some, but unable to do other household chores such as cleaning, laundry, and cooking.      UPDATE 01/08/2015:  There continues to be gradual decline in her memory.  Husband reports late afternoons can be more difficult because she can forget who her husband is and that their are married.  She can be agitated some days.  They have started to use light music in the evenings which has helped. Bathing is difficult because she does not always want to take a bath.  In May, they started having a caregiver come 4-days per week staying 10-hours per day.  She does not have many hobbies and engages socially as needed.  They tried an exercise program, but this was unsuccessful. Mood is "fine".  No hallucinations or bizarre behavior.  Sleep is significantly improved since using light evening music.  They have since stopped all other supplements.   UPDATE 06/22/2015:   Patient was seen sooner  than her scheduled visit because of new tremors, insomnia, and anxiety spells which have been ongoing for the past few months.  She saw her PCP last week who check TSH which returned very low.  Upon further questioning, she states that she has been on thyroid supplementation which was prescribed by their integrative physician. She has stopped thyroid supplements since late last week,  but not noticed any changes as of yet.  Her husband also describes very quick whole body jerks when she is sleeping and is worried that she is not getting adequate sleep because of the movements. She does not have complex movements of her limbs, such as kicking, climbing, etc.   Patient now needs helps with nearly all ADLs, such as bathing and dressing, but is still able to feed herself.  Mood is good.     Medications:  Current Outpatient Prescriptions on File Prior to Visit  Medication Sig Dispense Refill  . ARIPiprazole ([name]) 5 MG tablet Take 0.5 tablets (2.5 mg total) by mouth daily.    Marland Kitchen buPROPion (WELLBUTRIN XL) 300 MG 24 hr tablet TAKE 1 TABLET BY MOUTH EVERY DAY 30 tablet 0  . Cholecalciferol (VITAMIN D3) 5000 units CAPS Take by mouth daily.    . citalopram (CELEXA) 40 MG tablet TAKE 1 TABLET BY MOUTH EVERY DAY 30 tablet 0  . donepezil (ARICEPT) 23 MG TABS tablet Take 1 tablet (23 mg total) by mouth at bedtime. 30 tablet 11  . estrogens, conjugated, (PREMARIN) 0.9 MG tablet Take 0.9 mg by mouth daily. Take daily for 21 days then do not take for 7 days.    . Ginkgo Biloba 60 MG TABS Take by mouth daily.    . memantine (NAMENDA) 10 MG tablet Take 1 tablet (10 mg total) by mouth 2 (two) times daily. 30 tablet 11  . Menaquinone-7 (VITAMIN K2) 100 MCG CAPS Take by mouth daily.    . Multiple Vitamins-Minerals (HIGH POTENCY MULTIVIT/MIN/IRON) TABS Take 1 tablet by mouth 2 (two) times daily.     . NON FORMULARY Take by mouth 2 (two) times daily. Methyl Folate Plus 0000000 mg (B2, B-3, Folic Acid, Calcium L)    . NON FORMULARY Vinpocetine 10 mg daily    . NON FORMULARY Pregnenolone 75 mg daily.    . NON FORMULARY Bicopa 750 mg daily    . Omega-3 Fatty Acids (FISH OIL) 1000 MG CAPS Take by mouth daily.    . progesterone (PROMETRIUM) 200 MG capsule Take 1 capsule by mouth at bedtime.  12   No current facility-administered medications on file prior to visit.    Allergies:  Allergies   Allergen Reactions  . Gluten Meal Other (See Comments)  . Other Other (See Comments)    Food allergies: almond, banana, casein, cheese, cola, egg white, flaxseed, gluten, malt, cow and goat milks, mushrooms, pineapple, salmon, sesame, [place], wheat, whey, bakers and brewers yeast, yogurt.   . Tetracycline Hcl      Review of Systems:  CONSTITUTIONAL: No fevers, chills, night sweats, or weight loss.   EYES: No visual changes or eye pain ENT: No hearing changes.  No history of nose bleeds.   RESPIRATORY: No cough, wheezing and shortness of breath.   CARDIOVASCULAR: Negative for chest pain, and palpitations.   GI: Negative for abdominal discomfort, blood in stools or black stools.  No recent change in bowel habits.   GU:  No history of incontinence.   MUSCLOSKELETAL: No history of joint pain or swelling.  No myalgias.  SKIN: Negative for lesions, rash, and itching.   HEMATOLOGY/ONCOLOGY: Negative for prolonged bleeding, bruising easily, and swollen nodes.    ENDOCRINE: Negative for cold or heat intolerance, polydipsia or goiter.   PSYCH:  No depression or anxiety symptoms.   NEURO: As Above.   Vital Signs:  BP 120/80 mmHg  Pulse 74  Ht 5\' 2"  (1.575 m)  Wt 155 lb 2 oz (70.364 kg)  BMI 28.37 kg/m2  SpO2 95%  Neurological Exam: Montreal Cognitive Assessment  01/08/2015 02/19/2014  Visuospatial/ Executive (0/5) 1 1  Naming (0/3) 3 3  Attention: Read list of digits (0/2) 1 2  Attention: Read list of letters (0/1) 0 0  Attention: Serial 7 subtraction starting at 100 (0/3) 0 0  Language: Repeat phrase (0/2) 1 2  Language : Fluency (0/1) 0 0  Abstraction (0/2) 0 0  Delayed Recall (0/5) 0 0  Orientation (0/6) 1 1  Total 7 9  Adjusted Score (based on education) 7 9   Alert: normal  Oriented: to self  Eye Contact:normal, but reduced blink   Facial Expression: Appropriate  Speech/Language: normal, perseverates "OK"    Mood: Good  Affect: Blunted at times, but appropriately cheerful    Cranial nerves:  Pupils round and reactive bilaterally.  Extraocular muscles intact.  Face is symmetric.  Tongue is midline.  Palate elevates symmetrically.  Bilateral palmomental reflex is present.   Motor strength:  Strength is 5/5 in all extremities, normal tone  Reflexes:  Reflexes are brisk 3+ throughout, except 2+ bilateral Achilles.  No clonus.  Plantars are down going.     Gait:  Finger tapping and heel taping intact. No dysmetria. She has a low amplitude moderate frequency intention tremor, most noticeable at end-point and with action.   Reduced arm swing bilaterally, normal posture, stable and narrow-based gait.  Data: MRI brain 10/01/2012: Premature for age cerebral and cerebellar atrophy. Chronic microvascular ischemic change affecting the periventricular greater than subcortical white matter.  CT brain 06/07/2012: Mild premature atrophy. Chronic microvascular ischemic change. No acute intracranial findings.  Neuropsychiatric testing 08/09/2012, 09/05/2012: Serious deficits with in multiple cognitive domains suggestive of a severe global cognitive decline that far exceed what might be expected on the basis of depression and ADHD. 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  Labs:  05/24/2012:  TSH 0.53, B12 622  09/26/2012:  Vitamin E 0.8   IMPRESSION/PLAN: 1. 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  Hypnic jerks can be normal movement variants of sleep, symptoms do not suggest periodic limb movement of sleep  - For her insomnia, she can try melatonin 3mg  three hours before bedtime  - If symptoms persist, she may need a sleep study.   3.  Early onset Alzheimer's dementia, moderate to  severe  - Clinically worsening as per the natural progression of the disease  - She is nearly dependent on all ADLs, except for feeding.  - No behavioral or home safety issues, there is a caregiver that comes 4-days per week and they are managing well with her at home  - Continue Aricept 23mg  daily   - Continue Namenda to 10mg  twice daily  - Recommend tapering off supplements as their overall benefit to cognitive health is  unknown   4.  Depression/anxiety  - Recommend f/u with psychiatrist  Return to clinic in 3 months   The duration of this appointment visit was 25 minutes of face-to-face time with the patient.  Greater than 50% of this time was spent in counseling, explanation of diagnosis, planning of further management, and coordination of care.   Thank you for allowing me to participate in patient's care.  If I can answer any additional questions, I would be pleased to do so.    Sincerely,    Ying Blankenhorn K. Posey Pronto, DO

## 2015-07-05 ENCOUNTER — Other Ambulatory Visit: Payer: Self-pay | Admitting: Family Medicine

## 2015-08-13 ENCOUNTER — Other Ambulatory Visit (INDEPENDENT_AMBULATORY_CARE_PROVIDER_SITE_OTHER): Payer: Medicare Other

## 2015-08-13 DIAGNOSIS — R7989 Other specified abnormal findings of blood chemistry: Secondary | ICD-10-CM | POA: Diagnosis not present

## 2015-08-13 LAB — TSH: TSH: 0.79 u[IU]/mL (ref 0.35–4.50)

## 2015-09-16 ENCOUNTER — Other Ambulatory Visit: Payer: Self-pay | Admitting: Neurology

## 2015-09-16 NOTE — Telephone Encounter (Signed)
Rx sent 

## 2015-10-08 ENCOUNTER — Ambulatory Visit (INDEPENDENT_AMBULATORY_CARE_PROVIDER_SITE_OTHER): Payer: Medicare Other | Admitting: Neurology

## 2015-10-08 ENCOUNTER — Encounter: Payer: Self-pay | Admitting: Neurology

## 2015-10-08 VITALS — BP 118/68 | HR 65 | Ht 62.0 in | Wt 162.0 lb

## 2015-10-08 DIAGNOSIS — F0280 Dementia in other diseases classified elsewhere without behavioral disturbance: Secondary | ICD-10-CM

## 2015-10-08 DIAGNOSIS — G252 Other specified forms of tremor: Secondary | ICD-10-CM | POA: Diagnosis not present

## 2015-10-08 DIAGNOSIS — G300 Alzheimer's disease with early onset: Secondary | ICD-10-CM | POA: Diagnosis not present

## 2015-10-08 NOTE — Progress Notes (Signed)
[facility]  Follow-up Visit   Date: 10/08/15    Lisa Mclaughlin MRN: [id number] DOB: 09-17-1950   Interim History: Lisa Mclaughlin is a 65 y.o. year-old right-handed Caucasian female of English ancestry with history of ADD (diagnosed, 43s) and depression (diagnosed 2012) returning for early onset Alzheimer's disease.  The patient was accompanied to the clinic by her husband.   History of present illness: Husband reports that memory problems have been ongoing since ~2010, described as impairments with short-term memory, misplacing things, and trying to complete tasks. However, in March of 2013, her symptoms dramatically worsened and his behavior concerned her husband. He says that she started acting like she was in a dream world. She became very depressed and obsessed with childhood memories where she was bullied as a young girl. She became paranoid that these girls were coming after her because of unpaid club dues. She started seeing a psychologist, Dr. Terrance Mass, who diagnosed her with depression. Several medication adjustments have been made and her depression is almost 100% better but her memory remains impaired. She is forgetting short-term memory of events, dates, and names. She feels that she is still able to function independently and does all of her ADLs. She stopped driving in [id number]. If she focuses on things, she is able to recall things better.  In 2015, patient and her husband researched options and worked to supplements published in a [employer] study for dietary supplements in Alzheimer's disease.  They are also seeing integrative therapy from [facility] and staying active in an exercise program. She also started seeing Dr. Audria Nine at [facility] and was been started on other supplements. In the Spring of 2015, she saw Dr. Werner Lean at [facility] who agreed with diagnosis of Alzheimer's dementia. Because of their interest in clinical trials  related to supplements, he was give information on a trial in [facility]) which is the same that they are trying to replicate at home.  He recommended tapering the namenda but there was mild worsening in memory, so he restarted it.   UPDATE 02/19/2014:  Clinical trial that they were hoping to enroll has not started yet.  Overall, they feel she has slipped a little but attitude remains good.  She reports having less interests and no longer works on crossword puzzles or goes to yoga class. Her mood has been great, no concerns with depression.  She spends her day watching TV, reading, and taking naps.  She is able to make the bed and tidy around the home some, but unable to do other household chores such as cleaning, laundry, and cooking.      UPDATE 01/08/2015:  There continues to be gradual decline in her memory.  Husband reports late afternoons can be more difficult because she can forget who her husband is and that their are married.  She can be agitated some days.  They have started to use light music in the evenings which has helped. Bathing is difficult because she does not always want to take a bath.  In May, they started having a caregiver come 4-days per week staying 10-hours per day.  She does not have many hobbies and engages socially as needed.  They tried an exercise program, but this was unsuccessful. Mood is "fine".  No hallucinations or bizarre behavior.  Sleep is significantly improved since using light evening music.  They have since stopped all other supplements.   UPDATE 06/22/2015:   Patient was seen sooner  than her scheduled visit because of new tremors, insomnia, and anxiety spells which have been ongoing for the past few months.  She saw her PCP last week who check TSH which returned very low.  Upon further questioning, she states that she has been on thyroid supplementation which was prescribed by their integrative physician. She has stopped thyroid supplements since late last week,  but not noticed any changes as of yet.  Her husband also describes very quick whole body jerks when she is sleeping and is worried that she is not getting adequate sleep because of the movements. She does not have complex movements of her limbs, such as kicking, climbing, etc.   Patient now needs helps with nearly all ADLs, such as bathing and dressing, but is still able to feed herself.  Mood is good.    UPDATE 10/08/2015:  Husband reports that she has been stable since her last visit.  Tremors improved after stopping thyroid medications.  She continues to have spells of anxiety that occur about 1-2 times per day.  He has found that distraction and reorienting her tends to work.  She feels comfortable listening to classic music in their bedroom so he tries to use this space to alleviate her anxiety which works.  She is already taking wellbutrin 300mg , celexa 40mg , and abilify 2.5mg  daily for her depression/anxiety.  She has a caregiver that comes 4-days per week from 7:30am - 5:30pm who keeps her active and takes her on outings. She is dependent on all activities except feeding and toileting.  She often needs assistance to the bathroom, but is able to clean herself.    Medications:  Current Outpatient Prescriptions on File Prior to Visit  Medication Sig Dispense Refill  . ARIPiprazole ([name]) 5 MG tablet Take 0.5 tablets (2.5 mg total) by mouth daily.    Marland Kitchen buPROPion (WELLBUTRIN XL) 300 MG 24 hr tablet TAKE 1 TABLET BY MOUTH EVERY DAY 30 tablet 5  . Cholecalciferol (VITAMIN D3) 5000 units CAPS Take by mouth daily.    . citalopram (CELEXA) 40 MG tablet TAKE 1 TABLET BY MOUTH EVERY DAY 30 tablet 5  . donepezil (ARICEPT) 23 MG TABS tablet Take 1 tablet (23 mg total) by mouth at bedtime. 30 tablet 11  . estrogens, conjugated, (PREMARIN) 0.9 MG tablet Take 0.9 mg by mouth daily. Take daily for 21 days then do not take for 7 days.    . memantine (NAMENDA) 10 MG tablet TAKE 1 TABLET(10 MG) BY MOUTH TWICE  DAILY 60 tablet 3  . Multiple Vitamins-Minerals (HIGH POTENCY MULTIVIT/MIN/IRON) TABS Take 1 tablet by mouth 2 (two) times daily.     . Omega-3 Fatty Acids (FISH OIL) 1000 MG CAPS Take by mouth daily.    . progesterone (PROMETRIUM) 200 MG capsule Take 1 capsule by mouth at bedtime.  12   No current facility-administered medications on file prior to visit.     Allergies:  Allergies  Allergen Reactions  . Gluten Meal Other (See Comments)  . Other Other (See Comments)    Food allergies: almond, banana, casein, cheese, cola, egg white, flaxseed, gluten, malt, cow and goat milks, mushrooms, pineapple, salmon, sesame, [place], wheat, whey, bakers and brewers yeast, yogurt.   . Tetracycline Hcl      Review of Systems:  CONSTITUTIONAL: No fevers, chills, night sweats, or weight loss.   EYES: No visual changes or eye pain ENT: No hearing changes.  No history of nose bleeds.   RESPIRATORY: No cough, wheezing and  shortness of breath.   CARDIOVASCULAR: Negative for chest pain, and palpitations.   GI: Negative for abdominal discomfort, blood in stools or black stools.  No recent change in bowel habits.   GU:  No history of incontinence.   MUSCLOSKELETAL: No history of joint pain or swelling.  No myalgias.   SKIN: Negative for lesions, rash, and itching.   HEMATOLOGY/ONCOLOGY: Negative for prolonged bleeding, bruising easily, and swollen nodes.    ENDOCRINE: Negative for cold or heat intolerance, polydipsia or goiter.   PSYCH:  No depression or anxiety symptoms.   NEURO: As Above.   Vital Signs:  BP 118/68   Pulse 65   Ht 5\' 2"  (1.575 m)   Wt 162 lb (73.5 kg)   BMI 29.63 kg/m   Neurological Exam: Montreal Cognitive Assessment  01/08/2015 02/19/2014  Visuospatial/ Executive (0/5) 1 1  Naming (0/3) 3 3  Attention: Read list of digits (0/2) 1 2  Attention: Read list of letters (0/1) 0 0  Attention: Serial 7 subtraction starting at 100 (0/3) 0 0  Language: Repeat phrase (0/2) 1 2   Language : Fluency (0/1) 0 0  Abstraction (0/2) 0 0  Delayed Recall (0/5) 0 0  Orientation (0/6) 1 1  Total 7 9  Adjusted Score (based on education) 7 9   Alert: normal  Oriented: to self  Eye Contact: normal, but reduced blink   Facial Expression: Smiles appropriate Speech/Language: normal, perseverates "OK" "   Mood: Good  Affect: Blunted, but appropriately cheerful   Cranial nerves: Face is symmetric  Motor strength is 5/5 throughout.  Normal tone.  There is a mild intention tremor of the hands bilaterally.  Gait:  Finger tapping intact. No dysmetria. She has a low amplitude moderate frequency intention tremor, most noticeable at end-point and with action.   Reduced arm swing bilaterally, normal posture, stable and narrow-based gait.  Data: MRI brain 10/01/2012: Premature for age cerebral and cerebellar atrophy. Chronic microvascular ischemic change affecting the periventricular greater than subcortical white matter.  CT brain 06/07/2012: Mild premature atrophy. Chronic microvascular ischemic change. No acute intracranial findings.  Neuropsychiatric testing 08/09/2012, 09/05/2012: Serious deficits with in multiple cognitive domains suggestive of a severe global cognitive decline that far exceed what might be expected on the basis of depression and ADHD. 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  Labs:  05/24/2012:  TSH 0.53, B12 622  09/26/2012:  Vitamin E 0.8   IMPRESSION/PLAN: 1. Intention tremor, still present but improved from previously thought to be due to thyroid toxicity  - Offered to start primidone 50mg  at bedtime for symptom management, but symptoms not bothersome enough at this time  - Avoid betablockers due to history of severe depression  2.  Early onset Alzheimer's dementia, moderate to severe (stage 5)  - Clinically worsening as per the natural progression of the disease  - She is nearly dependent on all ADLs, except  for feeding.  - No behavioral or home safety issues, there is a caregiver that comes 4-days per week and they are managing well with her at home  - Continue Aricept 23mg  daily   - Continue Namenda to 10mg  twice daily   4.  Depression and increased frequency of anxiety spells.    - Encouraged non-pharmacological methods as they are doing - distraction, reorientation, music  - f/u with psychiatrist/PCP as she already takes wellbutrin 300mg , celexa 40mg , and abilify 2.5mg  daily   2.  Hypnic jerks can be  normal movement variants of sleep, symptoms do not suggest periodic limb movement of sleep - stable  Return to clinic in 6 months   The duration of this appointment visit was 25 minutes of face-to-face time with the patient.  Greater than 50% of this time was spent in counseling, explanation of diagnosis, planning of further management, and coordination of care.   Thank you for allowing me to participate in patient's care.  If I can answer any additional questions, I would be pleased to do so.    Sincerely,    Manuel Lawhead K. Posey Pronto, DO

## 2015-10-08 NOTE — Patient Instructions (Addendum)
You're doing great!   Recommend discussing anxiety with your primary care doctor or psychiatrist  Return to clinic in April/May

## 2015-11-03 ENCOUNTER — Other Ambulatory Visit: Payer: Self-pay | Admitting: Family Medicine

## 2015-11-03 NOTE — Telephone Encounter (Signed)
Received refill request electronically Last office visit 6/16/417 Okay to refill?

## 2015-11-04 NOTE — Telephone Encounter (Signed)
Sent. Thanks.   

## 2015-11-19 ENCOUNTER — Ambulatory Visit (INDEPENDENT_AMBULATORY_CARE_PROVIDER_SITE_OTHER): Payer: Medicare Other | Admitting: Family Medicine

## 2015-11-19 ENCOUNTER — Encounter: Payer: Self-pay | Admitting: Family Medicine

## 2015-11-19 VITALS — BP 120/74 | HR 67 | Temp 97.8°F | Ht 62.0 in | Wt 165.5 lb

## 2015-11-19 DIAGNOSIS — Z7189 Other specified counseling: Secondary | ICD-10-CM

## 2015-11-19 DIAGNOSIS — R0602 Shortness of breath: Secondary | ICD-10-CM

## 2015-11-19 DIAGNOSIS — Z0000 Encounter for general adult medical examination without abnormal findings: Secondary | ICD-10-CM | POA: Diagnosis not present

## 2015-11-19 DIAGNOSIS — Z8249 Family history of ischemic heart disease and other diseases of the circulatory system: Secondary | ICD-10-CM

## 2015-11-19 DIAGNOSIS — Z23 Encounter for immunization: Secondary | ICD-10-CM

## 2015-11-19 DIAGNOSIS — R413 Other amnesia: Secondary | ICD-10-CM

## 2015-11-19 NOTE — Progress Notes (Signed)
I have personally reviewed the Medicare Annual Wellness questionnaire and have noted 1. The patient's medical and social history 2. Their use of alcohol, tobacco or illicit drugs 3. Their current medications and supplements 4. The patient's functional ability including ADL's, fall risks, home safety risks and hearing or visual             impairment. 5. Diet and physical activities 6. Evidence for depression or mood disorders  The patients weight, height, BMI have been recorded in the chart and visual acuity is per eye clinic.  I have made referrals, counseling and provided education to the patient based review of the above and I have provided the pt with a written personalized care plan for preventive services.  Provider list updated- see scanned forms.  Routine anticipatory guidance given to patient.  See health maintenance.  Flu 2017 Shingles d/w pt. See AVS.  PNA 2017 Tetanus 2009 D/w patient [id number] for colon cancer screening, including stool screening vs. colonoscopy.  Risks and benefits of both were discussed and patient voiced understanding.  Pt elects for: cologuard.   Breast cancer screening- defer to gyn clinic. D/w pt and husband.  DXA- defer to gyn clinic.  D/w pt and husband.   Advance directive- husband designated if patient were incapacitated Cognitive function addressed- see scanned forms- and if abnormal then additional documentation follows.  The patient does not know the year month or day. She has memory loss noted baseline. This is not an acute change HIV and HCV screening declined.   EKG reviewed. See notes on EKG.  Memory loss. History of dementia. Her sleep cycle has been continuously difficult to control. She tried melatonin but that did not help. A lot of time she is up and down at night. We talked about safety. She had her husband have some help, with aids coming into the home. She occasionally gets anxious, likely related to her memory loss. Overall the  situation has not acutely changed and with current interventions it is manageable at home. All of this was discussed with patient and husband.  Patient does not have any chest pain, swelling, or shortness of breath at baseline. When she is walking up a slope she can occasionally get some shortness of breath. This is relieved with rest. She does not have a known cardiac history. She has had elevated lipids before but has a history of significantly elevated HDL. Discussed at this was thought to be protective. Her weight has gradually increased past few years. She also has known sleep disruption. It is likely that she is at least somewhat deconditioned given the changes that have gone on from her sleep cycle and also with her memory loss. All discussed with patient and husband at office visit  [place] reviewed  Meds, vitals, and allergies reviewed.   ROS: Per HPI.  Unless specifically indicated otherwise in HPI, the patient denies:  General: fever. Eyes: acute vision changes ENT: sore throat Cardiovascular: chest pain Respiratory: SOB GI: vomiting GU: dysuria Musculoskeletal: acute back pain Derm: acute rash Neuro: acute motor dysfunction Psych: worsening mood Endocrine: polydipsia Heme: bleeding Allergy: hayfever  GEN: nad, alert but not oriented, pleasant in conversation HEENT: mucous membranes moist NECK: supple w/o LA CV: rrr. PULM: ctab, no inc wob ABD: soft, +bs EXT: no edema SKIN: no acute rash

## 2015-11-19 NOTE — Patient Instructions (Addendum)
Check with your insurance to see if they will cover the shingles shot. Don't change your meds for now.  Update me as needed.  Check cholesterol when possible when fasting.  Keep exercising as tolerated and update me if concerns.  Take care.  Glad to see you.

## 2015-11-19 NOTE — Progress Notes (Signed)
Pre visit review using our clinic review tool, if applicable. No additional management support is needed unless otherwise documented below in the visit note. 

## 2015-11-20 DIAGNOSIS — Z0000 Encounter for general adult medical examination without abnormal findings: Secondary | ICD-10-CM | POA: Insufficient documentation

## 2015-11-20 DIAGNOSIS — Z7189 Other specified counseling: Secondary | ICD-10-CM | POA: Insufficient documentation

## 2015-11-20 DIAGNOSIS — R0602 Shortness of breath: Secondary | ICD-10-CM | POA: Insufficient documentation

## 2015-11-20 NOTE — Assessment & Plan Note (Signed)
With memory changes consistent with Alzheimer's. She's had a thorough evaluation from before. She has sleep cycle changes likely affect her daily activity. Reasonable to stop melatonin for a few days and then retry her on this just to see if it has any effect. She has aids coming home. Husband is supportive. Discussed safety. At this point the situation is manageable at home. They will update me as needed. We did not change her medications today.

## 2015-11-20 NOTE — Assessment & Plan Note (Signed)
Advance directive- husband designated if patient were incapacitated.  

## 2015-11-20 NOTE — Assessment & Plan Note (Addendum)
Flu 2017 Shingles d/w pt. See AVS.  PNA 2017 Tetanus 2009 D/w patient [id number] for colon cancer screening, including stool screening vs. colonoscopy.  Risks and benefits of both were discussed and patient voiced understanding.  Pt elects for: cologuard.   Breast cancer screening- defer to gyn clinic. D/w pt and husband.  DXA- defer to gyn clinic.  D/w pt and husband.   Advance directive- husband designated if patient were incapacitated Cognitive function addressed- see scanned forms- and if abnormal then additional documentation follows.  The patient does not know the year month or day. She has memory loss noted baseline. This is not an acute change HIV and HCV screening declined.

## 2015-11-20 NOTE — Assessment & Plan Note (Signed)
EKG without acute changes. I think some of this is related to deconditioning. Her EKG is without significant changes. She does not have chest pain. We can recheck her lipids when convenient. I think it makes sense to proceed with graded exercise at home and see how she does symptomatically in the near future. They can update me as needed. Patient and husband agree with plan.

## 2016-01-04 ENCOUNTER — Other Ambulatory Visit: Payer: Self-pay | Admitting: Family Medicine

## 2016-01-04 DIAGNOSIS — Z124 Encounter for screening for malignant neoplasm of cervix: Secondary | ICD-10-CM | POA: Diagnosis not present

## 2016-01-04 DIAGNOSIS — Z1231 Encounter for screening mammogram for malignant neoplasm of breast: Secondary | ICD-10-CM | POA: Diagnosis not present

## 2016-01-04 DIAGNOSIS — Z01419 Encounter for gynecological examination (general) (routine) without abnormal findings: Secondary | ICD-10-CM | POA: Diagnosis not present

## 2016-01-06 ENCOUNTER — Other Ambulatory Visit: Payer: Self-pay | Admitting: Obstetrics & Gynecology

## 2016-01-06 DIAGNOSIS — R928 Other abnormal and inconclusive findings on diagnostic imaging of breast: Secondary | ICD-10-CM

## 2016-01-14 ENCOUNTER — Encounter: Payer: Self-pay | Admitting: Neurology

## 2016-01-14 ENCOUNTER — Ambulatory Visit (INDEPENDENT_AMBULATORY_CARE_PROVIDER_SITE_OTHER)
Admission: RE | Admit: 2016-01-14 | Discharge: 2016-01-14 | Disposition: A | Discharge status: Home / Self Care | Payer: Medicare Other | Source: Ambulatory Visit | Attending: Family Medicine | Admitting: Family Medicine

## 2016-01-14 ENCOUNTER — Ambulatory Visit
Admission: RE | Admit: 2016-01-14 | Discharge: 2016-01-14 | Disposition: A | Discharge status: Home / Self Care | Payer: Medicare Other | Source: Ambulatory Visit | Attending: Obstetrics & Gynecology | Admitting: Obstetrics & Gynecology

## 2016-01-14 ENCOUNTER — Ambulatory Visit (INDEPENDENT_AMBULATORY_CARE_PROVIDER_SITE_OTHER): Payer: Medicare Other | Admitting: Family Medicine

## 2016-01-14 ENCOUNTER — Encounter: Payer: Self-pay | Admitting: Family Medicine

## 2016-01-14 ENCOUNTER — Ambulatory Visit (INDEPENDENT_AMBULATORY_CARE_PROVIDER_SITE_OTHER): Payer: Medicare Other | Admitting: Neurology

## 2016-01-14 VITALS — BP 100/74 | HR 79 | Ht 62.0 in | Wt 166.6 lb

## 2016-01-14 VITALS — BP 126/84 | HR 64 | Temp 98.0°F | Resp 20 | Wt 167.2 lb

## 2016-01-14 DIAGNOSIS — F0280 Dementia in other diseases classified elsewhere without behavioral disturbance: Secondary | ICD-10-CM

## 2016-01-14 DIAGNOSIS — R928 Other abnormal and inconclusive findings on diagnostic imaging of breast: Secondary | ICD-10-CM

## 2016-01-14 DIAGNOSIS — R0602 Shortness of breath: Secondary | ICD-10-CM | POA: Diagnosis not present

## 2016-01-14 DIAGNOSIS — Z1211 Encounter for screening for malignant neoplasm of colon: Secondary | ICD-10-CM

## 2016-01-14 DIAGNOSIS — F418 Other specified anxiety disorders: Secondary | ICD-10-CM

## 2016-01-14 DIAGNOSIS — N6001 Solitary cyst of right breast: Secondary | ICD-10-CM | POA: Diagnosis not present

## 2016-01-14 DIAGNOSIS — N6011 Diffuse cystic mastopathy of right breast: Secondary | ICD-10-CM | POA: Diagnosis not present

## 2016-01-14 DIAGNOSIS — N6310 Unspecified lump in the right breast, unspecified quadrant: Secondary | ICD-10-CM | POA: Diagnosis not present

## 2016-01-14 DIAGNOSIS — G300 Alzheimer's disease with early onset: Secondary | ICD-10-CM

## 2016-01-14 LAB — COMPREHENSIVE METABOLIC PANEL
ALK PHOS: 65 U/L (ref 39–117)
ALT: 17 U/L (ref 0–35)
AST: 16 U/L (ref 0–37)
Albumin: 3.7 g/dL (ref 3.5–5.2)
BILIRUBIN TOTAL: 0.3 mg/dL (ref 0.2–1.2)
BUN: 14 mg/dL (ref 6–23)
CALCIUM: 9.5 mg/dL (ref 8.4–10.5)
CO2: 28 meq/L (ref 19–32)
Chloride: 104 mEq/L (ref 96–112)
Creatinine, Ser: 0.86 mg/dL (ref 0.40–1.20)
GFR: 70.18 mL/min (ref >60.00)
GLUCOSE: 78 mg/dL (ref 70–99)
POTASSIUM: 4.3 meq/L (ref 3.5–5.1)
Sodium: 138 mEq/L (ref 135–145)
Total Protein: 6.3 g/dL (ref 6.0–8.3)

## 2016-01-14 LAB — CBC WITH DIFFERENTIAL/PLATELET
BASOS ABS: 0 10*3/uL (ref 0.0–0.1)
BASOS PCT: 0.6 % (ref 0.0–3.0)
EOS PCT: 1.2 % (ref 0.0–5.0)
Eosinophils Absolute: 0.1 10*3/uL (ref 0.0–0.7)
HEMATOCRIT: 42.2 % (ref 36.0–46.0)
Hemoglobin: 14.4 g/dL (ref 12.0–15.0)
LYMPHS ABS: 1.4 10*3/uL (ref 0.7–4.0)
LYMPHS PCT: 22.6 % (ref 12.0–46.0)
MCHC: 34 g/dL (ref 30.0–36.0)
MCV: 93.9 fl (ref 78.0–100.0)
MONOS PCT: 8.9 % (ref 3.0–12.0)
Monocytes Absolute: 0.6 10*3/uL (ref 0.1–1.0)
NEUTROS ABS: 4.2 10*3/uL (ref 1.4–7.7)
NEUTROS PCT: 66.7 % (ref 43.0–77.0)
Platelets: 290 10*3/uL (ref 150.0–400.0)
RBC: 4.5 Mil/uL (ref 3.87–5.11)
RDW: 13.1 % (ref 11.5–15.5)
WBC: 6.3 10*3/uL (ref 4.0–10.5)

## 2016-01-14 LAB — BRAIN NATRIURETIC PEPTIDE: PRO B NATRI PEPTIDE: 74 pg/mL (ref 0.0–100.0)

## 2016-01-14 LAB — TSH: TSH: 0.95 u[IU]/mL (ref 0.35–4.50)

## 2016-01-14 MED ORDER — LORAZEPAM 0.5 MG PO TABS: 0.2500 mg | ORAL_TABLET | Freq: Two times a day (BID) | ORAL | 0 refills | Status: DC | PRN

## 2016-01-14 NOTE — Progress Notes (Signed)
[facility]  Follow-up Visit   Date: 01/14/16    Lisa Mclaughlin MRN: [id number] DOB: [birth year]   Interim History: Lisa Mclaughlin is a 66 y.o. year-old right-handed Caucasian female of [place] ancestry with history of ADD (diagnosed, 78s) and depression (diagnosed 2012) returning for early onset Alzheimer's disease.  The patient was accompanied to the clinic by her husband.   History of present illness: Husband reports that memory problems have been ongoing since ~2010, described as impairments with short-term memory, misplacing things, and trying to complete tasks. However, in March of 2013, her symptoms dramatically worsened and his behavior concerned her husband. He says that she started acting like she was in a dream world. She became very depressed and obsessed with childhood memories where she was bullied as a young girl. She became paranoid that these girls were coming after her because of unpaid club dues. She started seeing a psychologist, Dr. Terrance Mass, who diagnosed her with depression. Several medication adjustments have been made and her depression is almost 100% better but her memory remains impaired. She is forgetting short-term memory of events, dates, and names. She feels that she is still able to function independently and does all of her ADLs. She stopped driving in 0932. If she focuses on things, she is able to recall things better.  In 2015, patient and her husband researched options and worked to supplements published in a [employer] study for dietary supplements in Alzheimer's disease.  They are also seeing integrative therapy from [facility] and staying active in an exercise program. She also started seeing Dr. Audria Nine at [facility] and was been started on other supplements. In the Spring of 2015, she saw Dr. Werner Lean at [facility] who agreed with diagnosis of Alzheimer's dementia. Because of their interest in clinical trials  related to supplements, he was give information on a trial in [facility]) which is the same that they are trying to replicate at home.  He recommended tapering the namenda but there was mild worsening in memory, so he restarted it.   UPDATE 01/08/2015:  There continues to be gradual decline in her memory.  Husband reports late afternoons can be more difficult because she can forget who her husband is and that their are married.  She can be agitated some days.  They have started to use light music in the evenings which has helped. Bathing is difficult because she does not always want to take a bath.  In May, they started having a caregiver come 4-days per week staying 10-hours per day.  She does not have many hobbies and engages socially as needed.  They tried an exercise program, but this was unsuccessful. Sleep is significantly improved since using light evening music.  They have since stopped all other supplements.   UPDATE 06/22/2015:   Patient was seen sooner than her scheduled visit because of new tremors, insomnia, and anxiety spells which have been ongoing for the past few months.  She saw her PCP last week who check TSH which returned very low.  Upon further questioning, she states that she has been on thyroid supplementation which was prescribed by their integrative physician. She has stopped thyroid supplements since late last week, but not noticed any changes as of yet.  Her husband also describes very quick whole body jerks when she is sleeping and is worried that she is not getting adequate sleep because of the movements. She does not have complex movements of her  limbs, such as kicking, climbing, etc.   Patient now needs helps with nearly all ADLs, such as bathing and dressing, but is still able to feed herself.  Mood is good.    UPDATE 10/08/2015:  Husband reports that she has been stable since her last visit.  Tremors improved after stopping thyroid medications.  She continues to have  spells of anxiety that occur about 1-2 times per day.  He has found that distraction and reorienting her tends to work.  She feels comfortable listening to classic music in their bedroom so he tries to use this space to alleviate her anxiety which works.  She is already taking wellbutrin 300mg , celexa 40mg , and abilify 2.5mg  daily for her depression/anxiety.  She has a caregiver that comes 4-days per week from 7:30am - 5:30pm who keeps her active and takes her on outings. She is dependent on all activities except feeding and toileting.    UPDATE 01/14/2016:  Her tremors are doing much better since discontinuing her thyroid medication. As expected, there is a slow and gradual decline in his cognitive and executive functioning.  She requires assistance with bathing, dressing, and brushing her teeth.  She often needs assistance to the bathroom, but is able to clean herself.   She can have spells of agitation and irritability and was given a prescription for ativan 0.5mg  take half-1 tablet twice daily as needed.  She does not wander, but often forgets what she is doing and repeats herself.  Sleep remains fragmented, but she no longer has abnormal jerks of the muscles. She has some dyspnea with exertion which is being worked up by her PCP.  Medications:  Current Outpatient Prescriptions on File Prior to Visit  Medication Sig Dispense Refill  . ARIPiprazole ([name]) 5 MG tablet Take 0.5 tablets (2.5 mg total) by mouth daily.    Marland Kitchen buPROPion (WELLBUTRIN XL) 300 MG 24 hr tablet TAKE 1 TABLET BY MOUTH EVERY DAY 30 tablet 3  . Cholecalciferol (VITAMIN D3) 5000 units CAPS Take by mouth daily.    . citalopram (CELEXA) 40 MG tablet TAKE 1 TABLET BY MOUTH EVERY DAY 30 tablet 3  . donepezil (ARICEPT) 23 MG TABS tablet Take 1 tablet (23 mg total) by mouth at bedtime. 30 tablet 11  . estrogens, conjugated, (PREMARIN) 0.9 MG tablet Take 0.9 mg by mouth daily. Take daily for 21 days then do not take for 7 days.    Marland Kitchen  MELATONIN PO Take 8 mg by mouth at bedtime.    . memantine (NAMENDA) 10 MG tablet TAKE 1 TABLET(10 MG) BY MOUTH TWICE DAILY 60 tablet 3  . Multiple Vitamins-Minerals (HIGH POTENCY MULTIVIT/MIN/IRON) TABS Take 1 tablet by mouth 2 (two) times daily.     . Omega-3 Fatty Acids (FISH OIL) 1000 MG CAPS Take by mouth daily.    . progesterone (PROMETRIUM) 200 MG capsule Take 1 capsule by mouth at bedtime.  12   No current facility-administered medications on file prior to visit.     Allergies:  Allergies  Allergen Reactions  . Gluten Meal Other (See Comments)  . Other Other (See Comments)    Food allergies: almond, banana, casein, cheese, cola, egg white, flaxseed, gluten, malt, cow and goat milks, mushrooms, pineapple, salmon, sesame, [place], wheat, whey, bakers and brewers yeast, yogurt.   . Tetracycline Hcl      Review of Systems:  CONSTITUTIONAL: No fevers, chills, night sweats, or weight loss.   EYES: No visual changes or eye pain ENT: No hearing  changes.  No history of nose bleeds.   RESPIRATORY: No cough, wheezing +shortness of breath.   CARDIOVASCULAR: Negative for chest pain, and palpitations.   GI: Negative for abdominal discomfort, blood in stools or black stools.  No recent change in bowel habits.   GU:  No history of incontinence.   MUSCLOSKELETAL: No history of joint pain or swelling.  No myalgias.   SKIN: Negative for lesions, rash, and itching.   HEMATOLOGY/ONCOLOGY: Negative for prolonged bleeding, bruising easily, and swollen nodes.    ENDOCRINE: Negative for cold or heat intolerance, polydipsia or goiter.   PSYCH:  No depression or anxiety symptoms.   NEURO: As Above.   Vital Signs:  BP 100/74   Pulse 79   Ht 5\' 2"  (1.575 m)   Wt 166 lb 9 oz (75.6 kg)   SpO2 96%   BMI 30.46 kg/m   Neurological Exam: Montreal Cognitive Assessment  01/08/2015 02/19/2014  Visuospatial/ Executive (0/5) 1 1  Naming (0/3) 3 3  Attention: Read list of digits (0/2) 1 2  Attention:  Read list of letters (0/1) 0 0  Attention: Serial 7 subtraction starting at 100 (0/3) 0 0  Language: Repeat phrase (0/2) 1 2  Language : Fluency (0/1) 0 0  Abstraction (0/2) 0 0  Delayed Recall (0/5) 0 0  Orientation (0/6) 1 1  Total 7 9  Adjusted Score (based on education) 7 9   She is awake, cheerful, and pleasant.  She is oriented to self and accurately identifies husband.  Affect is not as blunted today as previously noted.  She tries to engage in conversation, but there is paucity of speech  Cranial nerves: Face is symmetric  Motor strength is 5/5 throughout.  Normal tone.  There is a mild intention tremor of the hands bilaterally.  Gait:  Finger tapping intact. . She has a low amplitude intention tremor.   Reduced arm swing bilaterally, normal posture, stable and narrow-based gait.  Data: MRI brain 10/01/2012: Premature for age cerebral and cerebellar atrophy. Chronic microvascular ischemic change affecting the periventricular greater than subcortical white matter.  CT brain 06/07/2012: Mild premature atrophy. Chronic microvascular ischemic change. No acute intracranial findings.  Neuropsychiatric testing 08/09/2012, 09/05/2012: Serious deficits with in multiple cognitive domains suggestive of a severe global cognitive decline that far exceed what might be expected on the basis of depression and ADHD. 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  Labs:  05/24/2012:  TSH 0.53, B12 622  09/26/2012:  Vitamin E 0.8   IMPRESSION/PLAN: 1.  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  She has an excellent and supportive husband who is able to keep her safe and monitored at home.  She has a caregiver that comes 10hr/d x 5 days. She is nearly dependent on all ADLs, except for feeding.  - Continue Aricept 23mg  daily   - Continue Namenda to 10mg  twice daily   2.   Intention tremor, improved.    - Offered to start primidone 50mg  at bedtime for symptom management, but symptoms not bothersome enough at this time  - Avoid betablockers due to history of severe depression   3.  Depression and increased frequency of anxiety spells.    - Encouraged non-pharmacological methods as they are doing - distraction, reorientation, music  - Ativan 0.25-0.5mg  BID prn prescribed by PCP  - She takes wellbutrin 300mg , celexa 40mg , and  abilify 2.5mg  daily   Return to clinic in 9 months   The duration of this appointment visit was 25 minutes of face-to-face time with the patient.  Greater than 50% of this time was spent in counseling, explanation of diagnosis, planning of further management, and coordination of care.   Thank you for allowing me to participate in patient's care.  If I can answer any additional questions, I would be pleased to do so.    Sincerely,    Heidi Lemay K. Posey Pronto, DO

## 2016-01-14 NOTE — Patient Instructions (Signed)
It was great to see you today!  Return to clinic in 9 months or sooner

## 2016-01-14 NOTE — Progress Notes (Signed)
Insurance wouldn't cover cologuard but IFOB may be a reasonable substitution.  D/w pt and husband.    SOB with exertion.  Noted when going upstairs, up a flight of 15 steps.  Winded and lightheaded after going up stairs.  Then it resolves.  Noted with walking up an incline.  Not as severe in the last few days but she has been taking her time with going up stairs recently.  Going on for a few months.  No chest pain.  No cough, no wheeze.  No FCNAVD.  No BLE edema.    Anxiety has been cyclical over the years, not worse now but recently with an inc in anxiety.  Asking about prn med use.  May be exacerbated by sleep disruption.    PMH and SH reviewed  ROS: Per HPI unless specifically indicated in ROS section   Meds, vitals, and allergies reviewed.   GEN: nad, alert.   HEENT: mucous membranes moist NECK: supple w/o LA CV: rrr.  no murmur PULM: ctab, no inc wob ABD: soft, +bs EXT: no edema SKIN: no acute rash  EKG and labs and imaging noted.  See notes on results.

## 2016-01-14 NOTE — Patient Instructions (Addendum)
Go to the lab on the way out.  We'll contact you with your lab report. Use lorazepam as needed for anxiety, sedation caution.  Take care.  Glad to see you.  We'll be in touch.

## 2016-01-17 ENCOUNTER — Encounter: Payer: Self-pay | Admitting: *Deleted

## 2016-01-17 DIAGNOSIS — Z1211 Encounter for screening for malignant neoplasm of colon: Secondary | ICD-10-CM | POA: Insufficient documentation

## 2016-01-17 NOTE — Assessment & Plan Note (Signed)
cologuard pending.

## 2016-01-17 NOTE — Assessment & Plan Note (Signed)
More anxiety recently.  dw pt and husband about prn BZD use.  Sedation caution, routine cautions given, husband is managing her meds.  Update me as needed.  Likely related to/exacerbated by her memory changes.

## 2016-01-17 NOTE — Assessment & Plan Note (Addendum)
Worse recently.  See notes on results.   I think from a diagnostic and quality of life standpoint is is likely reasonable to have her see cards for consideration of a treadmill test.  If there is a cardiac issue potentially amenable to intervention that would improve her functional status, it should be considered along with her memory loss at baseline.  >25 minutes spent in face to face time with patient, >50% spent in counselling or coordination of care. See AVS.

## 2016-01-26 ENCOUNTER — Other Ambulatory Visit: Payer: Self-pay | Admitting: Neurology

## 2016-01-28 ENCOUNTER — Encounter: Payer: Self-pay | Admitting: Cardiology

## 2016-01-28 ENCOUNTER — Ambulatory Visit (INDEPENDENT_AMBULATORY_CARE_PROVIDER_SITE_OTHER): Payer: Medicare Other | Admitting: Cardiology

## 2016-01-28 VITALS — BP 152/77 | HR 69 | Ht 62.0 in | Wt 166.6 lb

## 2016-01-28 DIAGNOSIS — F0280 Dementia in other diseases classified elsewhere without behavioral disturbance: Secondary | ICD-10-CM | POA: Diagnosis not present

## 2016-01-28 DIAGNOSIS — R001 Bradycardia, unspecified: Secondary | ICD-10-CM | POA: Insufficient documentation

## 2016-01-28 DIAGNOSIS — G309 Alzheimer's disease, unspecified: Secondary | ICD-10-CM

## 2016-01-28 DIAGNOSIS — R0600 Dyspnea, unspecified: Secondary | ICD-10-CM | POA: Insufficient documentation

## 2016-01-28 DIAGNOSIS — R0609 Other forms of dyspnea: Secondary | ICD-10-CM | POA: Diagnosis not present

## 2016-01-28 NOTE — Assessment & Plan Note (Signed)
Pt noted to have dementia, being cared at home by her husband

## 2016-01-28 NOTE — Assessment & Plan Note (Signed)
HR 54- ? chronotropic incompetence

## 2016-01-28 NOTE — Patient Instructions (Signed)
Medication Instructions:  Your physician recommends that you continue on your current medications as directed. Please refer to the Current Medication list given to you today.   If you need a refill on your cardiac medications before your next appointment, please call your pharmacy.  Labwork: NONE  Testing/Procedures: Your physician has requested that you have an echocardiogram. Echocardiography is a painless test that uses sound waves to create images of your heart. It provides your doctor with information about the size and shape of your heart and how well your heart's chambers and valves are working. This procedure takes approximately one hour. There are no restrictions for this procedure.  Your physician has recommended that you wear a holter monitor. Holter monitors are medical devices that record the heart's electrical activity. Doctors most often use these monitors to diagnose arrhythmias. Arrhythmias are problems with the speed or rhythm of the heartbeat. The monitor is a small, portable device. You can wear one while you do your normal daily activities. This is usually used to diagnose what is causing palpitations/syncope (passing out).  Follow-Up: Your physician recommends that you schedule a follow-up appointment in: 2-3 [place]  Thank you for choosing [facility] at [facility]!!    Sharyn Lull, [place], PA-C

## 2016-01-28 NOTE — Progress Notes (Signed)
01/28/2016 Lisa Mclaughlin   [birth year]  [id number]  Primary Physician Elsie Stain, MD Primary Cardiologist: Dr Ellyn Hack (new)  HPI:  66 y/o female with early Alzheimer's, referred for evaluation of DOE. The pt lives at home with her husband. History is obtained from her husband. He has noted increasing DOE over the past few months. This most notable walking up the flight of steps to the apartment they live at. There is no history of chest pain. No syncope but she does say at times she is "dizzy". Recently he felt like I had to sit her down to recover.  Current Outpatient Prescriptions  Medication Sig Dispense Refill  . ARIPiprazole ([name]) 5 MG tablet Take 0.5 tablets (2.5 mg total) by mouth daily.    Marland Kitchen buPROPion (WELLBUTRIN XL) 300 MG 24 hr tablet TAKE 1 TABLET BY MOUTH EVERY DAY 30 tablet 3  . Cholecalciferol (VITAMIN D3) 5000 units CAPS Take by mouth daily.    . citalopram (CELEXA) 40 MG tablet TAKE 1 TABLET BY MOUTH EVERY DAY 30 tablet 3  . donepezil (ARICEPT) 23 MG TABS tablet Take 1 tablet (23 mg total) by mouth at bedtime. 30 tablet 11  . estrogens, conjugated, (PREMARIN) 0.9 MG tablet Take 0.9 mg by mouth daily. Take daily for 21 days then do not take for 7 days.    Marland Kitchen LORazepam (ATIVAN) 0.5 MG tablet Take 0.5-1 tablets (0.25-0.5 mg total) by mouth 2 (two) times daily as needed for anxiety (sedation caution). 20 tablet 0  . MELATONIN PO Take 8 mg by mouth at bedtime.    . memantine (NAMENDA) 10 MG tablet TAKE 1 TABLET(10 MG) BY MOUTH TWICE DAILY 60 tablet 0  . Multiple Vitamins-Minerals (HIGH POTENCY MULTIVIT/MIN/IRON) TABS Take 1 tablet by mouth 2 (two) times daily.     . Omega-3 Fatty Acids (FISH OIL) 1000 MG CAPS Take by mouth daily.    . progesterone (PROMETRIUM) 200 MG capsule Take 1 capsule by mouth at bedtime.  12   No current facility-administered medications for this visit.    Past Medical History:  Diagnosis Date  . ADD (attention deficit disorder with hyperactivity)    adult  . Alzheimer's disease, early onset   . Depression   . History of anemia   . Osteopenia 2002   dexa  . Osteoporosis   . Scoliosis     Allergies  Allergen Reactions  . Gluten Meal Other (See Comments)  . Other Other (See Comments)    Food allergies: almond, banana, casein, cheese, cola, egg white, flaxseed, gluten, malt, cow and goat milks, mushrooms, pineapple, salmon, sesame, [place], wheat, whey, bakers and brewers yeast, yogurt.   . Tetracycline Hcl     Social History   Social History  . Marital status: Married    Spouse name: N/A  . Number of children: 3  . Years of education: N/A   Occupational History  . 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 Smoking status: Former Smoker    Quit date: 09/27/1979  . Smokeless tobacco: Never Used  . Alcohol use 0.0 oz/week     Comment: occassionally  . Drug use: No  . Sexual activity: Not on file   Other Topics Concern  . 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 Heart disease Mother     CAD  . Alzheimer's disease Mother     Died, 47  . Heart disease Father      CAD MI at age 51  . Hypertension Father   . Hypertension Sister   . Cancer Other     leukemia  . Diabetes Sister   . Cancer Other     breast  . Breast cancer Other   . Colon cancer Neg Hx     Review of Systems: per husband- pt unable to give ROS General: negative for chills, fever, night sweats or weight changes.  Cardiovascular: negative for chest pain, dyspnea on exertion, edema, orthopnea, palpitations, paroxysmal nocturnal dyspnea or shortness of breath Dermatological: negative for rash Respiratory: negative for cough or wheezing Urologic: negative for hematuria Abdominal: negative for nausea, vomiting, diarrhea, bright red blood per  rectum, melena, or hematemesis Neurologic: negative for visual changes, syncope All other systems reviewed and are otherwise negative except as noted above.    Blood pressure (!) 152/77, pulse 69, height 5\' 2"  (1.575 m), weight 166 lb 9.6 oz (75.6 kg).  General appearance: alert, cooperative and no distress Neck: no carotid bruit and no JVD Lungs: clear to auscultation bilaterally Heart: regular rate and rhythm Abdomen: soft, non-tender; bowel sounds normal; no masses,  no organomegaly Extremities: extremities normal, atraumatic, no cyanosis or edema Pulses: 2+ and symmetric Skin: Skin color, texture, turgor normal. No rashes or lesions Neurologic: Grossly normal  EKG NSR, SB-54  ASSESSMENT AND PLAN:   Dyspnea on exertion Pt referred to [place] for evaluation of exertional dyspnea that has been present for a couple months but seems to be worsening  Dementia in Alzheimer's disease 2014 Neuropsychological testing c/w early onset dementia, likely Alzheimer's  Cared for at home by her husband  Bradycardia HR 54- ? chronotropic incompetence   PLAN  Discussed with Dr Ellyn Hack- check echo and 7 day Holter. F/U with Dr Ellyn Hack after this.   Kerin Ransom PA-C 01/28/2016 3:44 PM

## 2016-01-28 NOTE — Assessment & Plan Note (Addendum)
2014 Neuropsychological testing c/w early onset dementia, likely Alzheimer's  Cared for at home by her husband

## 2016-01-28 NOTE — Assessment & Plan Note (Signed)
Pt referred to [place] for evaluation of exertional dyspnea that has been present for a couple months but seems to be worsening

## 2016-01-31 ENCOUNTER — Other Ambulatory Visit: Payer: Self-pay | Admitting: Neurology

## 2016-02-01 ENCOUNTER — Other Ambulatory Visit: Payer: Self-pay | Admitting: *Deleted

## 2016-02-01 MED ORDER — MEMANTINE HCL 10 MG PO TABS: ORAL_TABLET | ORAL | 11 refills | Status: DC

## 2016-02-01 MED ORDER — DONEPEZIL HCL 23 MG PO TABS: 23.0000 mg | ORAL_TABLET | Freq: Every day | ORAL | 11 refills | Status: DC

## 2016-02-03 HISTORY — PX: OTHER SURGICAL HISTORY: SHX169

## 2016-02-09 ENCOUNTER — Telehealth (HOSPITAL_COMMUNITY): Payer: Self-pay | Admitting: Cardiology

## 2016-02-11 ENCOUNTER — Other Ambulatory Visit: Payer: Self-pay

## 2016-02-11 ENCOUNTER — Ambulatory Visit (HOSPITAL_COMMUNITY): Payer: Medicare Other | Attending: Internal Medicine

## 2016-02-11 ENCOUNTER — Ambulatory Visit (INDEPENDENT_AMBULATORY_CARE_PROVIDER_SITE_OTHER): Payer: Medicare Other

## 2016-02-11 DIAGNOSIS — R001 Bradycardia, unspecified: Secondary | ICD-10-CM | POA: Diagnosis not present

## 2016-02-11 DIAGNOSIS — R0609 Other forms of dyspnea: Secondary | ICD-10-CM | POA: Insufficient documentation

## 2016-02-11 HISTORY — PX: TRANSTHORACIC ECHOCARDIOGRAM: SHX275

## 2016-02-11 NOTE — Telephone Encounter (Signed)
  02/09/2016 01:10 PM Phone (Outgoing) Oliver,Freeman R (EC) [phone] (M)   Left Message - Returned husband's call about moving up his wife's appt for an echo.    By Verdene Rio

## 2016-02-12 DIAGNOSIS — R42 Dizziness and giddiness: Secondary | ICD-10-CM | POA: Diagnosis not present

## 2016-02-18 ENCOUNTER — Other Ambulatory Visit (HOSPITAL_COMMUNITY): Payer: Medicare Other

## 2016-03-01 LAB — FECAL OCCULT BLOOD, GUAIAC: FECAL OCCULT BLD: NEGATIVE

## 2016-03-06 ENCOUNTER — Other Ambulatory Visit: Payer: Self-pay | Admitting: Neurology

## 2016-03-06 MED ORDER — MEMANTINE HCL 10 MG PO TABS: ORAL_TABLET | ORAL | 11 refills | Status: DC

## 2016-03-06 NOTE — Telephone Encounter (Signed)
Rx sent 

## 2016-03-10 ENCOUNTER — Ambulatory Visit (INDEPENDENT_AMBULATORY_CARE_PROVIDER_SITE_OTHER): Payer: Medicare Other | Admitting: Cardiology

## 2016-03-10 ENCOUNTER — Encounter: Payer: Self-pay | Admitting: Cardiology

## 2016-03-10 VITALS — BP 142/73 | HR 70 | Ht 62.0 in | Wt 166.8 lb

## 2016-03-10 DIAGNOSIS — R0609 Other forms of dyspnea: Secondary | ICD-10-CM

## 2016-03-10 DIAGNOSIS — R001 Bradycardia, unspecified: Secondary | ICD-10-CM | POA: Diagnosis not present

## 2016-03-10 DIAGNOSIS — F418 Other specified anxiety disorders: Secondary | ICD-10-CM | POA: Diagnosis not present

## 2016-03-10 DIAGNOSIS — Z01818 Encounter for other preprocedural examination: Secondary | ICD-10-CM | POA: Diagnosis not present

## 2016-03-10 NOTE — Patient Instructions (Signed)
SCHEDULE AT [facility] Cardiac CT Angiography (CTA), is a special type of CT scan that uses a computer to produce multi-dimensional views of major blood vessels throughout HEART. In CT angiography, a contrast material is injected through an IV to help visualize the blood vessels   YOU WILL NEED TO HAVE LAB DONE AT 4 DAYS PRIOR RO THE CORONARY CTA     Your physician recommends that you schedule a follow-up appointment in 1-2 MONTHS WITH DR HARDING.

## 2016-03-10 NOTE — Progress Notes (Signed)
PCP: Elsie Stain, MD  Clinic Note: Chief Complaint  Patient presents with  . Follow-up    Echo, event monitor  . Shortness of Breath    HPI: Tylasia Fletchall is a 66 y.o. female with a PMH below who presents today for follow-up evaluation for exertional dyspnea and bradycardia.Jeryl Columbia was initially seen on 01/28/2016 by Kerin Ransom, PA. He noted increasing exertional dyspnea over the last few months notable with climbing steps. No chest pain and no syncope but there was some dizziness. Needed to sit down to recover. I discussed it with Kerin Ransom. He felt like he had some bradycardia with heart rate of 54 beats a minute and perhaps had chronotropic incompetence. We checked an echocardiogram and a 7 day event monitor. -- Most of the history is provided by her husband  Recent Hospitalizations: None  Studies Reviewed:   Event monitor 02/11/2016: Mostly sinus rhythm. Lowest heart rate recorded was 50 BPM. No A. fib noted. Heart rate did achieve the rate of 110 bpm. Lightheadedness and dizziness was associated with mostly sinus rhythm with occasional PVCs or mild sinus tachycardia. No pauses greater than 3 seconds noted.  2-D echocardiogram 02/11/2016: Normal LV size and function. EF 60-65%. No comment on diastolic function or any significant valvular lesion.  Interval History: Mrs. Inlow presents today for routine follow-up. She herself is a very poor historian with early onset dementia. She still has significant anxiety episodes as well as occasional episodes of heart palpitations. Her major issue, is that her husband describes that she has been having about 6 months history of worsening dyspnea climbing stairs that is gone the point where she doesn't do much exertion. There is no resting dyspnea, and no sign of chest tightness and pressure with rest or exertion.  No PND, orthopnea or edema.  She has occasional "flip-flopping" sensation in her heart, but no real rapid irregular heart  rate/palpitations. No syncope/near syncope. No TIA/amaurosis fugax symptoms. No claudication.  ROS: A comprehensive was performed. -Most of the symptoms are obtained by discussing with her husband which make it somewhat difficult. Review of Systems  Unable to perform ROS: Dementia  Constitutional: Positive for malaise/fatigue (Not really malaise, just simply lack of motivation to exercise from her dementia.).  Respiratory:       Dyspnea on exertion  Cardiovascular: Positive for palpitations.  Gastrointestinal: Negative for heartburn.  Neurological: Negative for dizziness.  Psychiatric/Behavioral: Positive for memory loss. The patient is nervous/anxious.   All other systems reviewed and are negative.   Past Medical History:  Diagnosis Date  . ADD (attention deficit disorder with hyperactivity)    adult  . Alzheimer's disease, early onset   . Depression   . History of anemia   . Osteopenia 2002   dexa  . Osteoporosis   . Scoliosis     Past Surgical History:  Procedure Laterality Date  . BREAST ENHANCEMENT SURGERY    . CESAREAN SECTION     x 3  . DILATION AND CURETTAGE OF UTERUS  2015  . NASAL SINUS SURGERY    . Pelvic ultrasound  05/10   thickened endometrium and ? polyp vs fibroid  . stress myoview normal  06/06    Current Meds  Medication Sig  . ARIPiprazole ([name]) 5 MG tablet Take 0.5 tablets (2.5 mg total) by mouth daily.  Marland Kitchen buPROPion (WELLBUTRIN XL) 300 MG 24 hr tablet TAKE 1 TABLET BY MOUTH EVERY DAY  . Cholecalciferol (VITAMIN D3) 5000 units CAPS Take  by mouth daily.  . citalopram (CELEXA) 40 MG tablet TAKE 1 TABLET BY MOUTH EVERY DAY  . donepezil (ARICEPT) 23 MG TABS tablet Take 1 tablet (23 mg total) by mouth at bedtime.  Marland Kitchen estrogens, conjugated, (PREMARIN) 0.9 MG tablet Take 0.9 mg by mouth daily. Take daily for 21 days then do not take for 7 days.  Marland Kitchen LORazepam (ATIVAN) 0.5 MG tablet Take 0.5-1 tablets (0.25-0.5 mg total) by mouth 2 (two) times daily as  needed for anxiety (sedation caution).  . MELATONIN PO Take 8 mg by mouth at bedtime.  . memantine (NAMENDA) 10 MG tablet TAKE 1 TABLET(10 MG) BY MOUTH TWICE DAILY  . Multiple Vitamins-Minerals (HIGH POTENCY MULTIVIT/MIN/IRON) TABS Take 1 tablet by mouth 2 (two) times daily.   . Omega-3 Fatty Acids (FISH OIL) 1000 MG CAPS Take 1 capsule by mouth daily.   . progesterone (PROMETRIUM) 200 MG capsule Take 1 capsule by mouth at bedtime.    Allergies  Allergen Reactions  . Gluten Meal Other (See Comments)  . Other Other (See Comments)    Food allergies: almond, banana, casein, cheese, cola, egg white, flaxseed, gluten, malt, cow and goat milks, mushrooms, pineapple, salmon, sesame, [place], wheat, whey, bakers and brewers yeast, yogurt.   . Tetracycline Hcl     Social History   Social History  . Marital status: Married    Spouse name: N/A  . Number of children: 3  . Years of education: N/A   Occupational History  . 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 Smoking status: Former Smoker    Quit date: 09/27/1979  . Smokeless tobacco: Never Used  . Alcohol use 0.0 oz/week     Comment: occassionally  . Drug use: No  . Sexual activity: Not Asked   Other Topics Concern  . 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  Wt Readings from Last 3 Encounters:  03/10/16 75.7 kg (166 lb 12.8 oz)  01/28/16 75.6 kg (166 lb 9.6 oz)  01/14/16 75.6 kg (166 lb 9 oz)    PHYSICAL EXAM BP (!) 142/73 (BP Location: Left Arm)   Pulse 70   Ht 5\' 2"  (1.575 m)   Wt 75.7 kg (166 lb 12.8 oz)   BMI 30.51 kg/m  General appearance:  alert, cooperative, appears stated age, no distress and Borderline obese Neck: no adenopathy, no carotid bruit and no JVD Lungs: clear to auscultation bilaterally, normal percussion bilaterally and non-labored Heart: regular rate and rhythm, S1 & S2 normal, no murmur, click, rub or gallop; nondisplaced PMI Abdomen: soft, non-tender; bowel sounds normal; no masses,  no organomegaly; No HJR Extremities: extremities normal, atraumatic, no cyanosis, or edema Pulses: 2+ and symmetric;  Skin: no evidence of bleeding or bruising, no lesions noted and Normal for age  Neurologic: Mental status: She is awake and alert. She knows that she is at the doctor's office. She really is minimally responsive and does not answer to many questions. Questions are answered with short yes no answers that would indicate she is not fully understand the question. (Most  of the history is provided by her husband.    Adult ECG Report n/a  Other studies Reviewed: Additional studies/ records that were reviewed today include:  Recent Labs:  n/a    ASSESSMENT / PLAN: Problem List Items Addressed This Visit    Bradycardia    Heart rate was still in the 50s occasionally during her monitor, but she was able to get her heart rate up with walk around. I doubt that dyspnea on exertion is related to chronotropic incompetence.      Depression with anxiety    I somewhat wonder if her depression/anxiety symptoms along with worsening dementia may be partly related to her decreased activity and therefore exertional dyspnea. I think if we can exclude any obstructive CAD, we can make things easier by not pursuing additional testing.      Dyspnea on exertion - Primary (Chronic)    - We had her wear a monitor there was no signs of significant bradycardia to suspect chronotropic incompetence and her echo was relatively normal. We therefore probably need to do an ischemic evaluation.  We talked about what the best option for testing her  would be. I do not think that a stress test will be a good option, I think it would be very anxiety provoking. I think probably the best option would be coronary CTA with possible CT FFR to look for any potential obstructive CAD.  We did discuss the potential of cardiac catheterization following this. She would be okay with it, but was not sure about being able sit still for nuclear stress testing. I don't know how well she would do with Lexiscan, and would certainly not be up to walk on treadmill based on her husbands recommendation.      Relevant Orders   CT CORONARY MORPH W/CTA COR W/SCORE W/CA W/CM &/OR WO/CM   Basic metabolic panel    Other Visit Diagnoses    Pre-op testing       Relevant Orders   Basic metabolic panel      Somewhat difficult to interview him to get mostly history from her husband. We spent close to half an hour discussing her recent studies as well as plans for going for to evaluate her dyspnea. >50% of time was spent in discussion.  Current medicines are reviewed at length with the patient today. (+/- concerns) None  Patient Instructions  SCHEDULE AT [facility] Cardiac CT Angiography (CTA), is a special type of CT scan that uses a computer to produce multi-dimensional views of major blood vessels throughout HEART. In CT angiography, a contrast material is injected through an IV to help visualize the blood vessels   YOU WILL NEED TO HAVE LAB DONE AT 4 DAYS PRIOR RO THE CORONARY CTA     Your physician recommends that you schedule a follow-up appointment in 1-2 MONTHS WITH DR HARDING.    Studies Ordered:   Orders Placed This Encounter  Procedures  . CT CORONARY MORPH W/CTA COR W/SCORE W/CA W/CM &/OR WO/CM  . Basic metabolic panel      Glenetta Hew, M.D., M.S. Interventional Cardiologist   Pager # [phone] Phone # [phone] [address] [place], [place] [address]

## 2016-03-12 ENCOUNTER — Encounter: Payer: Self-pay | Admitting: Cardiology

## 2016-03-12 NOTE — Assessment & Plan Note (Signed)
I somewhat wonder if her depression/anxiety symptoms along with worsening dementia may be partly related to her decreased activity and therefore exertional dyspnea. I think if we can exclude any obstructive CAD, we can make things easier by not pursuing additional testing.

## 2016-03-12 NOTE — Assessment & Plan Note (Addendum)
-   We had her wear a monitor there was no signs of significant bradycardia to suspect chronotropic incompetence and her echo was relatively normal. We therefore probably need to do an ischemic evaluation.  We talked about what the best option for testing her would be. I do not think that a stress test will be a good option, I think it would be very anxiety provoking. I think probably the best option would be coronary CTA with possible CT FFR to look for any potential obstructive CAD.  We did discuss the potential of cardiac catheterization following this. She would be okay with it, but was not sure about being able sit still for nuclear stress testing. I don't know how well she would do with Lexiscan, and would certainly not be up to walk on treadmill based on her husbands recommendation.

## 2016-03-12 NOTE — Assessment & Plan Note (Deleted)
We talked about what the best option for testing her would be. I do not think that a stress test will be a good option, I think it would be very anxiety provoking. I think probably the best option would be coronary CTA with possible CT FFR to look for any potential obstructive CAD.  We did discuss the potential of cardiac catheterization following this. She would be okay with it, but was not sure about being able sit still for nuclear stress testing. I don't know how well she would do with Lexiscan, and would certainly not be up to walk on treadmill based on her husbands recommendation.

## 2016-03-12 NOTE — Assessment & Plan Note (Signed)
Heart rate was still in the 50s occasionally during her monitor, but she was able to get her heart rate up with walk around. I doubt that dyspnea on exertion is related to chronotropic incompetence.

## 2016-03-14 ENCOUNTER — Encounter: Payer: Self-pay | Admitting: Cardiology

## 2016-03-22 ENCOUNTER — Encounter: Payer: Self-pay | Admitting: Family Medicine

## 2016-03-23 ENCOUNTER — Emergency Department (HOSPITAL_COMMUNITY): Payer: Medicare Other

## 2016-03-23 ENCOUNTER — Emergency Department (HOSPITAL_COMMUNITY)
Admission: EM | Admit: 2016-03-23 | Discharge: 2016-03-23 | Disposition: A | Discharge status: Home / Self Care | Payer: Medicare Other | Attending: Emergency Medicine | Admitting: Emergency Medicine

## 2016-03-23 DIAGNOSIS — G309 Alzheimer's disease, unspecified: Secondary | ICD-10-CM | POA: Insufficient documentation

## 2016-03-23 DIAGNOSIS — R0789 Other chest pain: Secondary | ICD-10-CM

## 2016-03-23 DIAGNOSIS — Z87891 Personal history of nicotine dependence: Secondary | ICD-10-CM | POA: Insufficient documentation

## 2016-03-23 DIAGNOSIS — R0602 Shortness of breath: Secondary | ICD-10-CM | POA: Diagnosis not present

## 2016-03-23 DIAGNOSIS — Z79899 Other long term (current) drug therapy: Secondary | ICD-10-CM | POA: Diagnosis not present

## 2016-03-23 DIAGNOSIS — R079 Chest pain, unspecified: Secondary | ICD-10-CM | POA: Diagnosis present

## 2016-03-23 DIAGNOSIS — R071 Chest pain on breathing: Secondary | ICD-10-CM | POA: Diagnosis not present

## 2016-03-23 LAB — CBC
HCT: 43.9 % (ref 36.0–46.0)
Hemoglobin: 14.9 g/dL (ref 12.0–15.0)
MCH: 31.9 pg (ref 26.0–34.0)
MCHC: 33.9 g/dL (ref 30.0–36.0)
MCV: 94 fL (ref 78.0–100.0)
PLATELETS: 271 10*3/uL (ref 150–400)
RBC: 4.67 MIL/uL (ref 3.87–5.11)
RDW: 13 % (ref 11.5–15.5)
WBC: 8.7 10*3/uL (ref 4.0–10.5)

## 2016-03-23 LAB — BASIC METABOLIC PANEL
Anion gap: 8 (ref 5–15)
BUN: 9 mg/dL (ref 6–20)
CHLORIDE: 106 mmol/L (ref 101–111)
CO2: 26 mmol/L (ref 22–32)
Calcium: 9.2 mg/dL (ref 8.9–10.3)
Creatinine, Ser: 1.09 mg/dL — ABNORMAL HIGH (ref 0.44–1.00)
GFR calc Af Amer: 60 mL/min — ABNORMAL LOW (ref >60)
GFR calc non Af Amer: 52 mL/min — ABNORMAL LOW (ref >60)
Glucose, Bld: 104 mg/dL — ABNORMAL HIGH (ref 65–99)
Potassium: 4 mmol/L (ref 3.5–5.1)
Sodium: 140 mmol/L (ref 135–145)

## 2016-03-23 LAB — TROPONIN I
Troponin I: <0.03 ng/mL (ref <0.03)
Troponin I: <0.03 ng/mL (ref <0.03)

## 2016-03-23 LAB — TSH: TSH: 0.989 u[IU]/mL (ref 0.350–4.500)

## 2016-03-23 NOTE — ED Triage Notes (Signed)
Patient from home where she lives with caregiver and husband. Caregiver saw patient get short of breath and clench her chest. Pain lasted for 5 minutes and has stayed resolved.

## 2016-03-23 NOTE — ED Notes (Signed)
Patient transported to X-ray 

## 2016-03-23 NOTE — ED Provider Notes (Signed)
[facility] DEPT Provider Note   CSN: [id number] Arrival date & time: 03/23/16  1417     History   Chief Complaint Chief Complaint  Patient presents with  . Chest Pain    HPI Lisa Mclaughlin is a 66 y.o. female.  Pt presents to the ED today with CP and SOB.  Pt has a hx of early onset dementia and has a care giver.  The care giver noticed that the pt was sob and clutched at her chest.  The pt has had this problem multiple times in the fast.  She has been seen by Dr. Ellyn Hack (cardiology) most recently on 3/9 for the same.  She had an event monitor on 2/9 which showed no a.fib, no significant bradycardia.  Pt had occ PVC.  Pt also had a 2D echo on 2/9 which was normal.  A stress test was not done because she has severe anxiety and it was thought that would be too anxiety provoking.  The pt is scheduled on 3/30 for a cardiac CT angiography with FFR to look for potential obstructive CAD.  Pt denies any CP or SOB now.            Past Medical History:  Diagnosis Date  . ADD (attention deficit disorder with hyperactivity)    adult  . Alzheimer's disease, early onset   . Depression   . History of anemia   . Osteopenia 2002   dexa  . Osteoporosis   . Scoliosis     Patient Active Problem List   Diagnosis Date Noted  . Dyspnea on exertion 01/28/2016  . Bradycardia 01/28/2016  . Colon cancer screening 01/17/2016  . Medicare welcome visit 11/20/2015  . Advance care planning 11/20/2015  . SOB (shortness of breath) on exertion 11/20/2015  . Agitation 10/16/2014  . Edema leg 07/09/2014  . Back pain 11/14/2012  . Hot flashes 05/24/2012  . Orthostasis 11/24/2011  . Dementia in Alzheimer's disease 09/20/2011  . IBS (irritable bowel syndrome) 01/04/2011  . UNSPECIFIED VITAMIN D DEFICIENCY 10/04/2009  . FATIGUE 03/15/2009  . POSTMENOPAUSAL BLEEDING 05/05/2008  . HYPERLIPIDEMIA 10/14/2007  . SCOLIOSIS 10/14/2007  . Depression with anxiety 07/11/2007  . OSTEOPOROSIS 07/11/2007      Past Surgical History:  Procedure Laterality Date  . BREAST ENHANCEMENT SURGERY    . CESAREAN SECTION     x 3  . DILATION AND CURETTAGE OF UTERUS  2015  . NASAL SINUS SURGERY    . Pelvic ultrasound  05/10   thickened endometrium and ? polyp vs fibroid  . stress myoview normal  06/06    OB History    No data available       Home Medications    Prior to Admission medications   Medication Sig Start Date End Date Taking? Authorizing Provider  ARIPiprazole ([name]) 5 MG tablet Take 0.5 tablets (2.5 mg total) by mouth daily. 07/10/14  Yes Tonia Ghent, MD  buPROPion (WELLBUTRIN XL) 300 MG 24 hr tablet TAKE 1 TABLET BY MOUTH EVERY DAY 01/04/16  Yes Tonia Ghent, MD  Cholecalciferol (VITAMIN D3) 5000 units CAPS Take 5,000 Units by mouth daily.    Yes Historical Provider, MD  citalopram (CELEXA) 40 MG tablet TAKE 1 TABLET BY MOUTH EVERY DAY Patient taking differently: TAKE 1 TABLET BY MOUTH EVERY DAY AT BEDTIME 01/04/16  Yes Tonia Ghent, MD  donepezil (ARICEPT) 23 MG TABS tablet Take 1 tablet (23 mg total) by mouth at bedtime. 02/01/16  Yes Alda Berthold,  DO  estrogens, conjugated, (PREMARIN) 0.9 MG tablet Take 0.9 mg by mouth daily. Take daily for 21 days then do not take for 7 days.   Yes Historical Provider, MD  LORazepam (ATIVAN) 0.5 MG tablet Take 0.5-1 tablets (0.25-0.5 mg total) by mouth 2 (two) times daily as needed for anxiety (sedation caution). 01/14/16  Yes Tonia Ghent, MD  MELATONIN PO Take 8 mg by mouth at bedtime.   Yes Historical Provider, MD  memantine (NAMENDA) 10 MG tablet TAKE 1 TABLET(10 MG) BY MOUTH TWICE DAILY 02/01/16  Yes Donika K Patel, DO  Multiple Vitamins-Minerals (HIGH POTENCY MULTIVIT/MIN/IRON) TABS Take 1 tablet by mouth 2 (two) times daily.    Yes Historical Provider, MD  Omega-3 Fatty Acids (FISH OIL) 1000 MG CAPS Take 1 capsule by mouth every evening.    Yes Historical Provider, MD  progesterone (PROMETRIUM) 200 MG capsule Take 1 capsule by  mouth at bedtime. 09/22/14  Yes Historical Provider, MD    Family History Family History  Problem Relation Age of Onset  . Heart disease Mother     CAD  . Alzheimer's disease Mother     Died, 28  . Heart disease Father      CAD MI at age 63  . Hypertension Father   . Hypertension Sister   . Cancer Other     leukemia  . Diabetes Sister   . Cancer Other     breast  . Breast cancer Other   . Colon cancer Neg Hx     Social History Social History  Substance Use Topics  . Smoking status: Former Smoker    Quit date: 09/27/1979  . Smokeless tobacco: Never Used  . Alcohol use 0.0 oz/week     Comment: occassionally     Allergies   Gluten meal; Other; and Tetracycline hcl   Review of Systems Review of Systems  Respiratory: Positive for shortness of breath.   Cardiovascular: Positive for chest pain.  All other systems reviewed and are negative.    Physical Exam Updated Vital Signs BP (!) 146/76   Pulse (!) 285   Resp 17   SpO2 (!) 85%   Physical Exam  Constitutional: She is oriented to person, place, and time. She appears well-developed and well-nourished.  HENT:  Head: Normocephalic and atraumatic.  Right Ear: External ear normal.  Left Ear: External ear normal.  Nose: Nose normal.  Mouth/Throat: Oropharynx is clear and moist.  Eyes: Conjunctivae and EOM are normal. Pupils are equal, round, and reactive to light.  Neck: Normal range of motion. Neck supple.  Cardiovascular: Normal rate, regular rhythm, normal heart sounds and intact distal pulses.   Pulmonary/Chest: Effort normal and breath sounds normal.  Abdominal: Soft. Bowel sounds are normal.  Musculoskeletal: Normal range of motion.  Neurological: She is alert and oriented to person, place, and time. She displays tremor.  Skin: Skin is warm and dry.  Psychiatric: She has a normal mood and affect. Her behavior is normal. Judgment and thought content normal.  Nursing note and vitals reviewed.    ED  Treatments / Results  Labs (all labs ordered are listed, but only abnormal results are displayed) Labs Reviewed  BASIC METABOLIC PANEL - Abnormal; Notable for the following:       Result Value   Glucose, Bld 104 (*)    Creatinine, Ser 1.09 (*)    GFR calc non Af Amer 52 (*)    GFR calc Af Amer 60 (*)    All  other components within normal limits  CBC  TROPONIN I  TSH  TROPONIN I  I-STAT TROPOININ, ED    EKG  EKG Interpretation  Date/Time:  Thursday March 23 2016 14:29:31 EDT Ventricular Rate:  69 PR Interval:  116 QRS Duration: 90 QT Interval:  288 QTC Calculation: 308 R Axis:   71 Text Interpretation:  Normal sinus rhythm Low voltage QRS Cannot rule out Anterior infarct , age undetermined Abnormal ECG Confirmed by Gilford Raid MD, Zamyah Wiesman ([id number]) on 03/23/2016 3:00:03 PM       Radiology Dg Chest 2 View  Result Date: 03/23/2016 CLINICAL DATA:  Shortness of breath.  No new chest complaints. EXAM: CHEST  2 VIEW COMPARISON:  01/14/2016 FINDINGS: There is no focal parenchymal opacity. There is no pleural effusion or pneumothorax. The heart and mediastinal contours are unremarkable. There is a large hiatal hernia. There is a S-shaped scoliosis of the thoracolumbar spine. IMPRESSION: No active cardiopulmonary disease. Electronically Signed   By: Kathreen Devoid   On: 03/23/2016 15:02    Procedures Procedures (including critical care time)  Medications Ordered in ED Medications - No data to display   Initial Impression / Assessment and Plan / ED Course  I have reviewed the triage vital signs and the nursing notes.  Pertinent labs & imaging results that were available during my care of the patient were reviewed by me and considered in my medical decision making (see chart for details).    Pt has not had any cp while here.  She is in the middle of a big work up by Dr. Ellyn Hack.  She has an appt for her cardiac CT in a few days.  She has very atypical pain with 2 negative troponins.   She is stable for d/c.  She knows to return if worse.  Final Clinical Impressions(s) / ED Diagnoses   Final diagnoses:  Atypical chest pain    New Prescriptions New Prescriptions   No medications on file     Isla Pence, MD 03/23/16 1911

## 2016-03-23 NOTE — ED Notes (Signed)
Did ekg on patient shown to Dr Liana Crocker

## 2016-03-27 ENCOUNTER — Ambulatory Visit (INDEPENDENT_AMBULATORY_CARE_PROVIDER_SITE_OTHER): Payer: Medicare Other | Admitting: Family Medicine

## 2016-03-27 ENCOUNTER — Encounter: Payer: Self-pay | Admitting: Family Medicine

## 2016-03-27 DIAGNOSIS — B079 Viral wart, unspecified: Secondary | ICD-10-CM | POA: Diagnosis not present

## 2016-03-27 DIAGNOSIS — D225 Melanocytic nevi of trunk: Secondary | ICD-10-CM | POA: Diagnosis not present

## 2016-03-27 DIAGNOSIS — B078 Other viral warts: Secondary | ICD-10-CM | POA: Diagnosis not present

## 2016-03-27 DIAGNOSIS — L820 Inflamed seborrheic keratosis: Secondary | ICD-10-CM | POA: Diagnosis not present

## 2016-03-27 DIAGNOSIS — R0602 Shortness of breath: Secondary | ICD-10-CM

## 2016-03-27 NOTE — Progress Notes (Signed)
SOB and dizziness is progressively worse.  Having more trouble with going up stairs.  This appears to be independent of any anxiety trouble she is having.  ER w/u neg (discussed with patient and husband at office visit, specifically negative labs and imaging) with CT heart pending.  D/w pt and husband.    D/w pt about anxiety.  Rare use of BZD; prn use of BZD helps some with anxiety.    PMH and SH reviewed  ROS: Per HPI unless specifically indicated in ROS section   Meds, vitals, and allergies reviewed.   GEN: nad, alert but not oriented HEENT: mucous membranes moist NECK: supple w/o LA CV: rrr. PULM: ctab, no inc wob ABD: soft, +bs EXT: no edema SKIN: no acute rash

## 2016-03-27 NOTE — Progress Notes (Signed)
Pre visit review using our clinic review tool, if applicable. No additional management support is needed unless otherwise documented below in the visit note. 

## 2016-03-27 NOTE — Patient Instructions (Signed)
Get through the CT scan this week and then well go from there.  We may need to address anxiety/reflux/routine exercise as we go along.  Take care.  Glad to see you.

## 2016-03-28 NOTE — Assessment & Plan Note (Addendum)
This appears to be separate from her anxiety. She does have CT heart pending.  She had good contractile function on previous echo. Previous heart monitor did not show significant arrhythmia. Unclear how much of her shortness of breath is related to dementia/deconditioning. I think it makes sense to get through the CT of her heart first and then we can make plans from there. Unclear if she could have reflux related airway irritation causing some of the shortness of breath.  Her anxiety seems to be a separate issue. She has used Ativan as needed along with her baseline psychiatric medications. This is likely reasonable to continue for now. At this point still okay for outpatient follow-up. >25 minutes spent in face to face time with patient, >50% spent in counselling or coordination of care.

## 2016-03-31 ENCOUNTER — Encounter (HOSPITAL_COMMUNITY): Payer: Self-pay

## 2016-03-31 ENCOUNTER — Ambulatory Visit (HOSPITAL_COMMUNITY)
Admission: RE | Admit: 2016-03-31 | Discharge: 2016-03-31 | Disposition: A | Discharge status: Home / Self Care | Payer: Medicare Other | Source: Ambulatory Visit | Attending: Cardiology | Admitting: Cardiology

## 2016-03-31 DIAGNOSIS — Z9882 Breast implant status: Secondary | ICD-10-CM | POA: Diagnosis not present

## 2016-03-31 DIAGNOSIS — J9811 Atelectasis: Secondary | ICD-10-CM | POA: Diagnosis not present

## 2016-03-31 DIAGNOSIS — K449 Diaphragmatic hernia without obstruction or gangrene: Secondary | ICD-10-CM | POA: Insufficient documentation

## 2016-03-31 DIAGNOSIS — J432 Centrilobular emphysema: Secondary | ICD-10-CM | POA: Diagnosis not present

## 2016-03-31 DIAGNOSIS — R0609 Other forms of dyspnea: Secondary | ICD-10-CM | POA: Insufficient documentation

## 2016-03-31 DIAGNOSIS — R911 Solitary pulmonary nodule: Secondary | ICD-10-CM | POA: Diagnosis not present

## 2016-03-31 DIAGNOSIS — R079 Chest pain, unspecified: Secondary | ICD-10-CM | POA: Diagnosis not present

## 2016-03-31 HISTORY — PX: OTHER SURGICAL HISTORY: SHX169

## 2016-03-31 MED ORDER — NITROGLYCERIN 0.4 MG SL SUBL
SUBLINGUAL_TABLET | SUBLINGUAL | Status: AC
  Filled 2016-03-31: qty 2

## 2016-03-31 MED ORDER — METOPROLOL TARTRATE 5 MG/5ML IV SOLN
INTRAVENOUS | Status: AC
  Filled 2016-03-31: qty 5

## 2016-03-31 MED ORDER — NITROGLYCERIN 0.4 MG SL SUBL
0.8000 mg | SUBLINGUAL_TABLET | Freq: Once | SUBLINGUAL | Status: AC
Start: 2016-03-31 — End: 2016-03-31
  Administered 2016-03-31: 0.8 mg via SUBLINGUAL
  Filled 2016-03-31: qty 25

## 2016-03-31 MED ORDER — IOPAMIDOL (ISOVUE-370) INJECTION 76%
INTRAVENOUS | Status: AC
  Administered 2016-03-31: 80 mL
  Filled 2016-03-31: qty 100

## 2016-03-31 MED ORDER — METOPROLOL TARTRATE 5 MG/5ML IV SOLN
5.0000 mg | Freq: Once | INTRAVENOUS | Status: AC
  Administered 2016-03-31: 5 mg via INTRAVENOUS
  Filled 2016-03-31: qty 5

## 2016-03-31 NOTE — Progress Notes (Signed)
CT scan completed. Tolerated well. D/C home walking with husband. Awake and alert. In no distress,.

## 2016-04-04 ENCOUNTER — Other Ambulatory Visit: Payer: Self-pay | Admitting: Neurology

## 2016-04-05 ENCOUNTER — Other Ambulatory Visit: Payer: Self-pay | Admitting: *Deleted

## 2016-04-05 MED ORDER — MEMANTINE HCL 10 MG PO TABS: ORAL_TABLET | ORAL | 11 refills | Status: DC

## 2016-04-05 NOTE — Telephone Encounter (Signed)
Rx sent 

## 2016-04-09 ENCOUNTER — Other Ambulatory Visit: Payer: Self-pay | Admitting: Family Medicine

## 2016-04-09 NOTE — Telephone Encounter (Signed)
Last office visit 03/27/16.  Last refilled 01/14/16 for#20 with no refills Ok to refill?

## 2016-04-09 NOTE — Telephone Encounter (Signed)
Please call in.  Thanks.   

## 2016-04-10 NOTE — Telephone Encounter (Signed)
Rx called in to requested pharmacy 

## 2016-04-14 ENCOUNTER — Ambulatory Visit (INDEPENDENT_AMBULATORY_CARE_PROVIDER_SITE_OTHER): Payer: Medicare Other | Admitting: Cardiology

## 2016-04-14 ENCOUNTER — Encounter: Payer: Self-pay | Admitting: Cardiology

## 2016-04-14 VITALS — BP 148/81 | HR 70 | Ht 62.0 in | Wt 163.8 lb

## 2016-04-14 DIAGNOSIS — Z7189 Other specified counseling: Secondary | ICD-10-CM

## 2016-04-14 DIAGNOSIS — R0609 Other forms of dyspnea: Secondary | ICD-10-CM | POA: Diagnosis not present

## 2016-04-14 DIAGNOSIS — R001 Bradycardia, unspecified: Secondary | ICD-10-CM | POA: Diagnosis not present

## 2016-04-14 DIAGNOSIS — I25119 Atherosclerotic heart disease of native coronary artery with unspecified angina pectoris: Secondary | ICD-10-CM | POA: Insufficient documentation

## 2016-04-14 MED ORDER — CARVEDILOL 3.125 MG PO TABS: 3.1250 mg | ORAL_TABLET | Freq: Two times a day (BID) | ORAL | 6 refills | Status: DC

## 2016-04-14 MED ORDER — ISOSORBIDE MONONITRATE ER 30 MG PO TB24: 30.0000 mg | ORAL_TABLET | Freq: Every day | ORAL | 6 refills | Status: DC

## 2016-04-14 MED ORDER — ASPIRIN 81 MG PO TABS: 81.0000 mg | ORAL_TABLET | Freq: Every day | ORAL | 11 refills | Status: AC

## 2016-04-14 NOTE — Progress Notes (Signed)
PCP: Elsie Stain, MD  Clinic Note: Chief Complaint  Patient presents with  . Follow-up    Dyspnea.  Abnormal Coronary CTA-FFR    HPI: Lisa Mclaughlin is a 66 y.o. female with a PMH below who presents today for one-month follow-up to evaluate exertional dyspnea. I saw her on March 19, then she went to the emergency room on March 22 with atypical chest pain.  She had an echocardiogram done that was relatively normal and an event monitor was relatively benign.  Lisa Mclaughlin was last seen on 03/10/2016. This is a follow-up from initial visit with Lisa Ransom, PA for exertional dyspnea. We checked an event monitor to evaluate for chronotropic incompetence that was not significant. She had an echocardiogram that was normal. When I saw her we ordered a coronary CTA that was separately sent for CT FFR with results noted below. She now presents to discuss the results of this.  Recent Hospitalizations: ER visit for atypical chest pain on March 22. She ruled out for MI and was discharged home. Apparently her caregiver noted her describing shortness of breath and cluster chest itches occurred in the past. As result she went into the emergency room and ruled out for MI. She then followed up with her CT scan.  Studies Reviewed:  Coronary CTA 03/31/2016: Showed distal LAD stenosis with an FFR of 0.77. No left main, circumflex or RCA disease noted..  Interval History: Lisa Mclaughlin presents today really without any significant complaints. Her husband tells Lisa Mclaughlin history as she just lies there. She clearly has not had any further chest pain episodes but is still short of breath with exertion. She has not had any symptoms of PND, orthopnea or edema. Nothing to suggest any palpitations although she would not tell us. No syncope/near-syncope or TIAs amaurosis fugax symptoms. She really doesn't walk enough and could not tell us if she is having claudication. She does not note pain in her legs.  Very difficult historian  with most of it provided by her husband.  ROS: A comprehensive was performed. History provided by husband Review of Systems  Unable to perform ROS: Dementia  Respiratory: Positive for shortness of breath.   Cardiovascular: Positive for chest pain (Episode that made her go to ER presumed to be chest pain with clutching her chest.).  Psychiatric/Behavioral: Positive for memory loss. Negative for depression and hallucinations.  All other systems reviewed and are negative.   Past Medical History:  Diagnosis Date  . ADD (attention deficit disorder with hyperactivity)    adult  . Alzheimer's disease, early onset   . Depression   . History of anemia   . Osteopenia 2002   dexa  . Osteoporosis   . Scoliosis     Past Surgical History:  Procedure Laterality Date  . BREAST ENHANCEMENT SURGERY    . CARDIAC EVENT MONITOR  02/2016   Mostly sinus rhythm. Lowest heart rate recorded was 50 BPM. No A. fib noted. Heart rate did achieve the rate of 110 bpm. Lightheadedness and dizziness was associated with mostly sinus rhythm with occasional PVCs or mild sinus tachycardia. No pauses greater than 3 seconds noted.   Marland Kitchen CESAREAN SECTION     x 3  . CORNARY CTA-FFR  03/31/2016   distal LAD stenosis with an FFR of 0.77. No left main, circumflex or RCA disease noted.. --> PLAN - medical management  . DILATION AND CURETTAGE OF UTERUS  2015  . NASAL SINUS SURGERY    . NM MYOVIEW LTD  06/2004   No infarct or Ischemia  . Pelvic ultrasound  05/10   thickened endometrium and ? polyp vs fibroid  . TRANSTHORACIC ECHOCARDIOGRAM  02/11/2016   Normal LV size and function. EF 60-65%. No comment on diastolic function or any significant valvular lesion.    Current Meds  Medication Sig  . ARIPiprazole ([name]) 5 MG tablet Take 0.5 tablets (2.5 mg total) by mouth daily.  Marland Kitchen buPROPion (WELLBUTRIN XL) 300 MG 24 hr tablet TAKE 1 TABLET BY MOUTH EVERY DAY  . Cholecalciferol (VITAMIN D3) 5000 units CAPS Take 5,000  Units by mouth daily.   . citalopram (CELEXA) 40 MG tablet TAKE 1 TABLET BY MOUTH EVERY DAY (Patient taking differently: TAKE 1 TABLET BY MOUTH EVERY DAY AT BEDTIME)  . donepezil (ARICEPT) 23 MG TABS tablet Take 1 tablet (23 mg total) by mouth at bedtime.  Marland Kitchen estrogens, conjugated, (PREMARIN) 0.9 MG tablet Take 0.9 mg by mouth daily. Take daily for 21 days then do not take for 7 days.  Marland Kitchen LORazepam (ATIVAN) 0.5 MG tablet TAKE ONE-HALF TO ONE TABLET BY MOUTH TWICE DAILY AS NEEDED FOR ANXIETY  . MELATONIN PO Take 8 mg by mouth at bedtime.  . memantine (NAMENDA) 10 MG tablet TAKE 1 TABLET(10 MG) BY MOUTH TWICE DAILY  . Multiple Vitamins-Minerals (HIGH POTENCY MULTIVIT/MIN/IRON) TABS Take 1 tablet by mouth 2 (two) times daily.   . Omega-3 Fatty Acids (FISH OIL) 1000 MG CAPS Take 1 capsule by mouth every evening.   . progesterone (PROMETRIUM) 200 MG capsule Take 1 capsule by mouth at bedtime.    Allergies  Allergen Reactions  . Gluten Meal Other (See Comments)    Allergic sensitivity  . Other Other (See Comments)    Allergic sensitivity-Food allergies: almond, banana, casein, cheese, cola, egg white, flaxseed, gluten, malt, cow and goat milks, mushrooms, pineapple, salmon, sesame, [place], wheat, whey, bakers and brewers yeast, yogurt.   . Tetracycline Hcl Other (See Comments)    unknown    Social History   Social History  . Marital status: Married    Spouse name: N/A  . Number of children: 3  . Years of education: N/A   Occupational History  . 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 Smoking status: Former Smoker    Quit date: 09/27/1979  . Smokeless tobacco: Never Used  . Alcohol use 0.0 oz/week     Comment: occassionally  . Drug use: No  . Sexual activity: Not Asked   Other Topics Concern  . 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  Wt Readings from Last 3 Encounters:  04/14/16 163 lb 12.8 oz (74.3 kg)  03/27/16 163 lb 8 oz (74.2 kg)  03/10/16 166 lb 12.8 oz (75.7 kg)    PHYSICAL EXAM BP (!) 148/81 (BP Location: Right Arm)   Pulse 70   Ht 5\' 2"  (1.575 m)   Wt 163 lb 12.8 oz (74.3 kg)   BMI 29.96 kg/m  General appearance: Very pleasant woman who appears older than her stated age. She really does not respond  much, and does not answer questions. She is pleasant, and clearly having signs of dementia. She is otherwise well-nourished well-groomed  Neck: no adenopathy, no carotid bruit and no JVD Lungs: clear to auscultation bilaterally, normal percussion bilaterally and non-labored Heart: regular rate and rhythm, S1& S2 normal, no murmur, click, rub or gallop; nondisplaced PMI Abdomen: soft, non-tender; bowel sounds normal; no masses,  no organomegaly; Neurologic: Poor historian. Does not answer questions but sits and smiles. Baseline exam is nonfocal otherwise.    Adult ECG Report  not checked  Other studies Reviewed: Additional studies/ records that were reviewed today include:  Recent Labs:   n/a   ASSESSMENT / PLAN: Problem List Items Addressed This Visit    Advance care planning    Patient does have an advanced directive with her husband designated as caregiver and Media planner. Clearly she cannot make her own decisions. With this in mind I think he has her best interest at heart, and I agree that invasive procedures are probably not a good option.  Would opt for medical management unless we are obliged because of symptoms.      Bradycardia - Primary    She had some lower heart rates on her monitor, but I think she can tolerate low-dose carvedilol was post metoprolol because  it is less heart rate responsive. Unlikely to have chronotropic incompetence based on her monitor.      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 I spent quite a bit time talking to her husband about what this means, and how the distal lesion probably is not always a good PCI target. He is in agreement with me that she probably is not a great candidate to take to the cardiac catheterization lab unless her symptoms get progressively worse. He is trying to minimize how much is gone. He and I both agreed that medical management is the best option at this time. We did spend about 30 minutes talking about the results of the test and the best plan.  The plan for now will be to add those carvedilol (watching for signs of bradycardia) as well as Imdur 30 mg daily. She will also start when necessary aspirin. Excellent eye would not want to use a statin simply because of her memory loss.      Relevant Medications   aspirin 81 MG tablet   carvedilol (COREG) 3.125 MG tablet   isosorbide mononitrate (IMDUR) 30 MG 24 hr tablet   Dyspnea on exertion (Chronic)    No signs of chronotropic incompetence, in fact she could get her heart rate up fine. I think she can tolerate a beta blocker for her distal LAD disease noted on CT scan. It's really hard to tell what her dyspnea is, I don't think exertional dyspnea is related to the distal LAD lesion, however the status the only objective data we have, we will treat accordingly. Add Imdur and aspirin. Only his symptoms get really bad reconsider cardiac catheterization with possible PCI. She has no heart failure symptoms and has a normal echocardiogram.         Over 40 minutes was spent with the patient. Greater than 75% of the time was spent in direct counseling and discussing best course of action as well as the explaining the  results of her test.  Current medicines are reviewed at length with the patient today. (+/-  concerns) n/a The following changes have been made: n/a  Patient Instructions  Medication addition Start - Carvedilol ( coreg) 3.125 mg  Twice a day  - baby aspirin 81 mg - take 30 min prior to  New start imdur ( isosorbide mn) 30 mg at bedtime.    Your physician recommends that your follow-up appointment is Aug 3 ,2018 at 1:20 pm with Dr Ellyn Hack    Studies Ordered:   No orders of the defined types were placed in this encounter.     Glenetta Hew, M.D., M.S. Interventional Cardiologist   Pager # [phone] Phone # [phone] [address] [place], [place] [address]

## 2016-04-14 NOTE — Patient Instructions (Addendum)
Medication addition Start - Carvedilol ( coreg) 3.125 mg  Twice a day  - baby aspirin 81 mg - take 30 min prior to  New start imdur ( isosorbide mn) 30 mg at bedtime.    Your physician recommends that your follow-up appointment is Aug 3 ,2018 at 1:20 pm with Dr Ellyn Hack

## 2016-04-16 ENCOUNTER — Encounter: Payer: Self-pay | Admitting: Cardiology

## 2016-04-16 NOTE — Assessment & Plan Note (Addendum)
She clearly has evidence of distal LAD disease with a positive S4. I spent quite a bit time talking to her husband about what this means, and how the distal lesion probably is not always a good PCI target. He is in agreement with me that she probably is not a great candidate to take to the cardiac catheterization lab unless her symptoms get progressively worse. He is trying to minimize how much is gone. He and I both agreed that medical management is the best option at this time. We did spend about 30 minutes talking about the results of the test and the best plan.  The plan for now will be to add those carvedilol (watching for signs of bradycardia) as well as Imdur 30 mg daily. She will also start when necessary aspirin. Excellent eye would not want to use a statin simply because of her memory loss.

## 2016-04-16 NOTE — Assessment & Plan Note (Signed)
Patient does have an advanced directive with her husband designated as caregiver and Media planner. Clearly she cannot make her own decisions. With this in mind I think he has her best interest at heart, and I agree that invasive procedures are probably not a good option.  Would opt for medical management unless we are obliged because of symptoms.

## 2016-04-16 NOTE — Assessment & Plan Note (Signed)
No signs of chronotropic incompetence, in fact she could get her heart rate up fine. I think she can tolerate a beta blocker for her distal LAD disease noted on CT scan. It's really hard to tell what her dyspnea is, I don't think exertional dyspnea is related to the distal LAD lesion, however the status the only objective data we have, we will treat accordingly. Add Imdur and aspirin. Only his symptoms get really bad reconsider cardiac catheterization with possible PCI. She has no heart failure symptoms and has a normal echocardiogram.

## 2016-04-16 NOTE — Assessment & Plan Note (Signed)
She had some lower heart rates on her monitor, but I think she can tolerate low-dose carvedilol was post metoprolol because it is less heart rate responsive. Unlikely to have chronotropic incompetence based on her monitor.

## 2016-04-21 ENCOUNTER — Ambulatory Visit: Payer: Medicare Other | Admitting: Neurology

## 2016-05-01 ENCOUNTER — Other Ambulatory Visit: Payer: Self-pay | Admitting: Family Medicine

## 2016-05-12 ENCOUNTER — Ambulatory Visit: Payer: Medicare Other | Admitting: Cardiology

## 2016-05-18 ENCOUNTER — Other Ambulatory Visit: Payer: Self-pay | Admitting: Cardiology

## 2016-05-22 ENCOUNTER — Ambulatory Visit (INDEPENDENT_AMBULATORY_CARE_PROVIDER_SITE_OTHER): Payer: Medicare Other | Admitting: Family Medicine

## 2016-05-22 ENCOUNTER — Encounter: Payer: Self-pay | Admitting: Family Medicine

## 2016-05-22 VITALS — BP 111/68 | HR 59 | Temp 98.5°F | Ht 62.0 in | Wt 162.5 lb

## 2016-05-22 DIAGNOSIS — G309 Alzheimer's disease, unspecified: Secondary | ICD-10-CM

## 2016-05-22 DIAGNOSIS — R05 Cough: Secondary | ICD-10-CM

## 2016-05-22 DIAGNOSIS — F0280 Dementia in other diseases classified elsewhere without behavioral disturbance: Secondary | ICD-10-CM | POA: Diagnosis not present

## 2016-05-22 DIAGNOSIS — R059 Cough, unspecified: Secondary | ICD-10-CM

## 2016-05-22 NOTE — Assessment & Plan Note (Signed)
Allergies vs early viral infection. No sign of bacterial infection.  Rest, fluids. Zyrtec and symptomatic care.

## 2016-05-22 NOTE — Progress Notes (Signed)
   Subjective:    Patient ID: Lisa Mclaughlin, female    DOB: 1950-04-14, 66 y.o.   MRN: [id number]  Cough  This is a new problem. The current episode started in the past 7 days (2 days). The problem has been gradually worsening. The cough is productive of sputum. Associated symptoms include chills and a fever. Pertinent negatives include no chest pain, ear congestion, ear pain, hemoptysis, myalgias, nasal congestion, postnasal drip, rash, rhinorrhea, sore throat, shortness of breath or wheezing. Risk factors for lung disease include smoking/tobacco exposure ( quit 35 years ago). She has tried nothing for the symptoms. There is no history of asthma, bronchitis, COPD or environmental allergies.  Fever   The maximum temperature noted was 99 to 99.9 F. Associated symptoms include coughing. Pertinent negatives include no chest pain, ear pain, rash, sore throat or wheezing. She has tried acetaminophen for the symptoms. The treatment provided moderate relief.  Risk factors: no sick contacts     Hx of dementia .Marland Kitchen With husband as historian today   Blood pressure 111/68, pulse (!) 59, temperature 98.5 F (36.9 C), temperature source Oral, height 5\' 2"  (1.575 m), weight 162 lb 8 oz (73.7 kg), SpO2 95 %.  Review of Systems  Constitutional: Positive for chills and fever.  HENT: Negative for ear pain, postnasal drip, rhinorrhea and sore throat.   Respiratory: Positive for cough. Negative for hemoptysis, shortness of breath and wheezing.   Cardiovascular: Negative for chest pain.  Musculoskeletal: Negative for myalgias.  Skin: Negative for rash.  Allergic/Immunologic: Negative for environmental allergies.       Objective:   Physical Exam  Constitutional: Vital signs are normal. She appears well-developed and well-nourished. She is cooperative.  Non-toxic appearance. She does not appear ill. No distress.  HENT:  Head: Normocephalic.  Right Ear: Hearing, tympanic membrane, external ear and ear canal normal.  Tympanic membrane is not erythematous, not retracted and not bulging.  Left Ear: Hearing, tympanic membrane, external ear and ear canal normal. Tympanic membrane is not erythematous, not retracted and not bulging.  Nose: No mucosal edema or rhinorrhea. Right sinus exhibits no maxillary sinus tenderness and no frontal sinus tenderness. Left sinus exhibits no maxillary sinus tenderness and no frontal sinus tenderness.  Mouth/Throat: Uvula is midline, oropharynx is clear and moist and mucous membranes are normal.  Eyes: Conjunctivae, EOM and lids are normal. Pupils are equal, round, and reactive to light. Lids are everted and swept, no foreign bodies found.  Neck: Trachea normal and normal range of motion. Neck supple. Carotid bruit is not present. No thyroid mass and no thyromegaly present.  Cardiovascular: Normal rate, regular rhythm, S1 normal, S2 normal, normal heart sounds, intact distal pulses and normal pulses.  Exam reveals no gallop and no friction rub.   No murmur heard. Pulmonary/Chest: Effort normal and breath sounds normal. No tachypnea. No respiratory distress. She has no decreased breath sounds. She has no wheezes. She has no rhonchi. She has no rales.  Neurological: She is alert.  Skin: Skin is warm, dry and intact. No rash noted.  Psychiatric: Her speech is normal and behavior is normal. Judgment normal. Her mood appears not anxious. Cognition and memory are normal. She does not exhibit a depressed mood.          Assessment & Plan:

## 2016-05-22 NOTE — Patient Instructions (Signed)
Start zyrtec OTC at bedtime. Rest, fluids.  Cough drops and mucinex DM as needed. Call if any shortness of breath.. Go to ER if severe shortness of breath.

## 2016-05-22 NOTE — Assessment & Plan Note (Signed)
Stable at baseline 

## 2016-05-24 ENCOUNTER — Other Ambulatory Visit: Payer: Self-pay | Admitting: Family Medicine

## 2016-05-25 NOTE — Telephone Encounter (Signed)
Please call in.  Thanks.   

## 2016-05-25 NOTE — Telephone Encounter (Signed)
Last refill 04/09/16 #20, last OV 05/22/16

## 2016-05-26 ENCOUNTER — Ambulatory Visit: Payer: Medicare Other | Admitting: Neurology

## 2016-05-26 NOTE — Telephone Encounter (Signed)
Medication phoned to pharmacy.  

## 2016-06-26 ENCOUNTER — Other Ambulatory Visit: Payer: Self-pay

## 2016-06-26 NOTE — Telephone Encounter (Signed)
Lisa Mclaughlin (DPR signed) left v/m; pt is taking the lorazepam daily. The qty # 30 is only a 15 day supply. Pt and her husband are going out of town for the holiday and the birth of a new grandchild. Randy request renewal of lorazepam to [facility]. Randy request cb. Pt last seen by Dr Damita Dunnings for f/u on 03/27/16.

## 2016-06-27 MED ORDER — LORAZEPAM 0.5 MG PO TABS: 0.2500 mg | ORAL_TABLET | Freq: Two times a day (BID) | ORAL | 1 refills | Status: DC | PRN

## 2016-06-27 NOTE — Telephone Encounter (Signed)
Medication phoned to pharmacy.  

## 2016-06-27 NOTE — Telephone Encounter (Signed)
Please call in.  Thanks.   

## 2016-08-04 ENCOUNTER — Ambulatory Visit (INDEPENDENT_AMBULATORY_CARE_PROVIDER_SITE_OTHER): Payer: Medicare Other | Admitting: Cardiology

## 2016-08-04 ENCOUNTER — Ambulatory Visit: Payer: Medicare Other | Admitting: Family Medicine

## 2016-08-04 ENCOUNTER — Other Ambulatory Visit: Payer: Self-pay | Admitting: Cardiology

## 2016-08-04 ENCOUNTER — Encounter: Payer: Self-pay | Admitting: Cardiology

## 2016-08-04 VITALS — BP 127/77 | HR 62 | Ht 62.0 in | Wt 163.0 lb

## 2016-08-04 DIAGNOSIS — R0609 Other forms of dyspnea: Secondary | ICD-10-CM

## 2016-08-04 DIAGNOSIS — R001 Bradycardia, unspecified: Secondary | ICD-10-CM

## 2016-08-04 DIAGNOSIS — I25119 Atherosclerotic heart disease of native coronary artery with unspecified angina pectoris: Secondary | ICD-10-CM

## 2016-08-04 MED ORDER — NITROGLYCERIN 0.4 MG SL SUBL: 0.4000 mg | SUBLINGUAL_TABLET | SUBLINGUAL | 6 refills | Status: AC | PRN

## 2016-08-04 NOTE — Progress Notes (Signed)
PCP: Tonia Ghent, MD  Clinic Note: Chief Complaint  Patient presents with  . Follow-up    3/4 months;  . Coronary Artery Disease    Abnormal Cor CTA with+ FFR, Med Rx    HPI: Lisa Mclaughlin is a 66 y.o. female with a PMH below who presents today for 5 month follow-up exertional dyspnea and bradycardia.  She was evaluated with an event monitor back in February 2017 that showed heart rate range 50-110 bpm. She did have occasional PVCs but no pauses. 3 seconds. An echocardiogram was essentially normal with normal ejection fraction.  Lisa Mclaughlin was last seen on 04/14/2016. She was quite a poor historian with early onset dementia. She still had anxiety with palpitations. She noted exertional dyspnea going up stairs and no resting dyspnea. No chest tightness or pressure. She had a follow-up visit after her coronary CTA-FFR. We decided that time to treat her symptoms medically and avoid invasive procedures cardiac catheterization. We added Imdur and carvedilol as well as baby aspirin  Recent Hospitalizations: none  Studies Personally Reviewed - (if available, images/films reviewed: From Epic Chart or Care Everywhere)  Coronary CTA -  distal LAD disease that are 0.77 plan was medical management   Interval History: Lisa Mclaughlin returns today along with her husband & caregiver who essentially provides all of her historical information.  He has noted over the past several months that she does have some occasions when she simply acts winded & SOB, but more often it is a balance issue that keeps her from walking too far.   No further episodes of grabbing her chest to indicate chest pain / angina.  No PND, orthopnea or edema. No palpitations, syncope/near syncope, or TIA/amaurosis fugax symptoms. No melena, hematochezia, hematuria, or epstaxis. No claudication.  ROS: A comprehensive was performed.  Provided by husband. Review of Systems  Unable to perform ROS: Dementia  Constitutional: Negative for  malaise/fatigue.  Respiratory: Positive for shortness of breath.   Musculoskeletal: Negative for falls (has come close - buther husband caught her).  Neurological:       Unsteady gait  Psychiatric/Behavioral: Positive for memory loss. The patient is nervous/anxious and has insomnia.   All other systems reviewed and are negative.  I have reviewed and (if needed) personally updated the patient's problem list, medications, allergies, past medical and surgical history, social and family history.   Past Medical History:  Diagnosis Date  . ADD (attention deficit disorder with hyperactivity)    adult  . Alzheimer's disease, early onset   . Depression   . History of anemia   . Osteopenia 2002   dexa  . Osteoporosis   . Scoliosis     Past Surgical History:  Procedure Laterality Date  . BREAST ENHANCEMENT SURGERY    . CARDIAC EVENT MONITOR  02/2016   Mostly sinus rhythm. Lowest heart rate recorded was 50 BPM. No A. fib noted. Heart rate did achieve the rate of 110 bpm. Lightheadedness and dizziness was associated with mostly sinus rhythm with occasional PVCs or mild sinus tachycardia. No pauses greater than 3 seconds noted.   Marland Kitchen CESAREAN SECTION     x 3  . CORNARY CTA-FFR  03/31/2016   distal LAD stenosis with an FFR of 0.77. No left main, circumflex or RCA disease noted.. --> PLAN - medical management  . DILATION AND CURETTAGE OF UTERUS  2015  . NASAL SINUS SURGERY    . NM MYOVIEW LTD  06/2004   No infarct or Ischemia  .  Pelvic ultrasound  05/10   thickened endometrium and ? polyp vs fibroid  . TRANSTHORACIC ECHOCARDIOGRAM  02/11/2016   Normal LV size and function. EF 60-65%. No comment on diastolic function or any significant valvular lesion.    Current Meds  Medication Sig  . ARIPiprazole ([name]) 5 MG tablet Take 0.5 tablets (2.5 mg total) by mouth daily.  Marland Kitchen aspirin 81 MG tablet Take 1 tablet (81 mg total) by mouth daily.  Marland Kitchen buPROPion (WELLBUTRIN XL) 300 MG 24 hr tablet TAKE 1  TABLET BY MOUTH EVERY DAY  . carvedilol (COREG) 3.125 MG tablet TAKE 1 TABLET(3.125 MG) BY MOUTH TWICE DAILY  . Cholecalciferol (VITAMIN D3) 5000 units CAPS Take 5,000 Units by mouth daily.   . citalopram (CELEXA) 40 MG tablet TAKE 1 TABLET BY MOUTH EVERY DAY  . donepezil (ARICEPT) 23 MG TABS tablet Take 1 tablet (23 mg total) by mouth at bedtime.  Marland Kitchen estrogens, conjugated, (PREMARIN) 0.9 MG tablet Take 0.9 mg by mouth daily. Take daily for 21 days then do not take for 7 days.  Marland Kitchen LORazepam (ATIVAN) 0.5 MG tablet Take 0.5-1 tablets (0.25-0.5 mg total) by mouth 2 (two) times daily as needed. for anxiety  . MELATONIN PO Take 8 mg by mouth at bedtime.  . memantine (NAMENDA) 10 MG tablet TAKE 1 TABLET(10 MG) BY MOUTH TWICE DAILY  . Multiple Vitamins-Minerals (HIGH POTENCY MULTIVIT/MIN/IRON) TABS Take 1 tablet by mouth 2 (two) times daily.   . Omega-3 Fatty Acids (FISH OIL) 1000 MG CAPS Take 1 capsule by mouth every evening.   . progesterone (PROMETRIUM) 200 MG capsule Take 1 capsule by mouth at bedtime.    Allergies  Allergen Reactions  . Gluten Meal Other (See Comments)    Allergic sensitivity  . Other Other (See Comments)    Allergic sensitivity-Food allergies: almond, banana, casein, cheese, cola, egg white, flaxseed, gluten, malt, cow and goat milks, mushrooms, pineapple, salmon, sesame, [place], wheat, whey, bakers and brewers yeast, yogurt.   . Tetracycline Hcl Other (See Comments)    unknown    Social History   Social History  . Marital status: Married    Spouse name: N/A  . Number of children: 3  . Years of education: N/A   Occupational History  . 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 Smoking status: Former Smoker    Quit date: 09/27/1979  . Smokeless tobacco: Never Used  . Alcohol use 0.0 oz/week     Comment: occassionally  . Drug use: No  . Sexual activity: Not Asked   Other Topics Concern  . 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  Wt Readings from Last 3 Encounters:  08/04/16 163 lb (73.9 kg)  05/22/16 162 lb 8 oz (73.7 kg)  04/14/16 163 lb 12.8 oz (74.3 kg)    PHYSICAL EXAM BP 127/77   Pulse 62   Ht 5\' 2"  (1.575 m)   Wt 163 lb (73.9 kg)   BMI 29.81 kg/m  Physical Exam  Constitutional: She appears well-developed and well-nourished. No distress.  HENT:  Head: Normocephalic and atraumatic.  Mouth/Throat: No oropharyngeal exudate.  Neck:  No carotid bruit  Cardiovascular: Normal rate, regular rhythm, normal heart sounds and intact distal pulses.   Occasional extrasystoles are present. Exam reveals no gallop and no friction rub.   No murmur heard. No JVD or edema  Pulmonary/Chest: Effort normal and breath sounds normal. No respiratory distress. She has no wheezes.  Neurological: She is alert.  Oriented to self & knows her husband.  Not to place or time.  Cannot answer questions beyond yes & not -but I cannot be sure that she knows what she is answering.  Skin: Skin is warm and dry.  Psychiatric:  Unable to answer ?s. Anxious mood & affect.  Very sheepish.  Poor historian due to dementia  Nursing note and vitals reviewed.    Adult ECG Report  Rate: 62 ;  Rhythm: normal sinus rhythm and Non-specific ST-T wave changes; normal axis, intervals & durations  Narrative Interpretation: Stable ECG   Other studies Reviewed: Additional studies/ records that were reviewed today include:  Recent Labs:  n/a     ASSESSMENT / PLAN: Problem List Items Addressed This Visit    Bradycardia    Would not be able to titrate Beta Blocker nay further.      Coronary artery  disease involving native coronary artery of native heart with angina pectoris ([place]) - Primary (Chronic)    Plan per discussion with her husband it to minimize therapy & avoid invasive procedures. She is on a low dose BB, ASA & Imdur. Will provide Rx for SL NTG. --> if used, would double up Imdur x 2-3 days PRN. No statin to avoid compounding memory concerns.      Relevant Medications   nitroGLYCERIN (NITROSTAT) 0.4 MG SL tablet   Other Relevant Orders   EKG 12-Lead   Dyspnea on exertion (Chronic)    May never truly know her symptoms & cause b/c she cannot provide accurate history. Consider SL NTG for bad episodes.  We did check an Echocardiogram - normal Fxn & no comment on significant Diastolic dysfunction.  No CHF Sx. Could be deconditioning.         Current medicines are reviewed at length with the patient today. (+/- concerns) none The following changes have been made: none  Patient Instructions  MEDICATION INSTRUCTIONS  MAY USE NITROGLYCERIN SUBLINGUAL TABLET FOR CHEST DISCOMFORT.FOLLOW BELOW INSTRUCTION   IF YOU USE  NTG MEDICATION , TAKE 2 TABLETS OF ISOSORBIDE DAILY FOR 2 DAYS AFTER EACH EPISODES   START TAKING ASPIRIN 81 MG DAILY  .NO OTHER CHANGES   Your physician wants you to follow-up in [place]. You will receive a reminder letter in the mail two months in advance. If you don't receive a letter, please call our office to schedule the follow-up appointment.   If you need a refill on your cardiac medications before your next appointment, please call your pharmacy.   Nitroglycerin sublingual tablets What is this medicine? NITROGLYCERIN (nye troe GLI ser in) is a type of vasodilator. It relaxes blood vessels, increasing the blood and oxygen supply to your heart. This medicine is used to relieve chest pain caused by angina. It is also used to prevent chest pain before activities like climbing stairs, going outdoors in cold weather, or sexual  activity. This medicine may be used for other purposes; ask your health care provider or pharmacist if you have questions. COMMON BRAND NAME(S): Nitroquick, Nitrostat, Nitrotab What should I tell  my health care provider before I take this medicine? They need to know if you have any of these conditions: -anemia -head injury, recent stroke, or bleeding in the brain -liver disease -previous heart attack -an unusual or allergic reaction to nitroglycerin, other medicines, foods, dyes, or preservatives -pregnant or trying to get pregnant -breast-feeding How should I use this medicine? Take this medicine by mouth as needed. At the first sign of an angina attack (chest pain or tightness) place one tablet under your tongue. You can also take this medicine 5 to 10 minutes before an event likely to produce chest pain. Follow the directions on the prescription label. Let the tablet dissolve under the tongue. Do not swallow whole. Replace the dose if you accidentally swallow it. It will help if your mouth is not dry. Saliva around the tablet will help it to dissolve more quickly. Do not eat or drink, smoke or chew tobacco while a tablet is dissolving. If you are not better within 5 minutes after taking ONE dose of nitroglycerin, call 9-1-1 immediately to seek emergency medical care. Do not take more than 3 nitroglycerin tablets over 15 minutes. If you take this medicine often to relieve symptoms of angina, your doctor or health care professional may provide you with different instructions to manage your symptoms. If symptoms do not go away after following these instructions, it is important to call 9-1-1 immediately. Do not take more than 3 nitroglycerin tablets over 15 minutes. Talk to your pediatrician regarding the use of this medicine in children. Special care may be needed. Overdosage: If you think you have taken too much of this medicine contact a poison control center or emergency room at once. NOTE: This  medicine is only for you. Do not share this medicine with others. What if I miss a dose? This does not apply. This medicine is only used as needed. What may interact with this medicine? Do not take this medicine with any of the following medications: -certain migraine medicines like ergotamine and dihydroergotamine (DHE) -medicines used to treat erectile dysfunction like sildenafil, tadalafil, and vardenafil -riociguat This medicine may also interact with the following medications: -alteplase -aspirin -heparin -medicines for high blood pressure -medicines for mental depression -other medicines used to treat angina -phenothiazines like chlorpromazine, mesoridazine, prochlorperazine, thioridazine This list may not describe all possible interactions. Give your health care provider a list of all the medicines, herbs, non-prescription drugs, or dietary supplements you use. Also tell them if you smoke, drink alcohol, or use illegal drugs. Some items may interact with your medicine. What should I watch for while using this medicine? Tell your doctor or health care professional if you feel your medicine is no longer working. Keep this medicine with you at all times. Sit or lie down when you take your medicine to prevent falling if you feel dizzy or faint after using it. Try to remain calm. This will help you to feel better faster. If you feel dizzy, take several deep breaths and lie down with your feet propped up, or bend forward with your head resting between your knees. You may get drowsy or dizzy. Do not drive, use machinery, or do anything that needs mental alertness until you know how this drug affects you. Do not stand or sit up quickly, especially if you are an older patient. This reduces the risk of dizzy or fainting spells. Alcohol can make you more drowsy and dizzy. Avoid alcoholic drinks. Do not treat yourself for coughs, colds, or pain  while you are taking this medicine without asking your  doctor or health care professional for advice. Some ingredients may increase your blood pressure. What side effects may I notice from receiving this medicine? Side effects that you should report to your doctor or health care professional as soon as possible: -blurred vision -dry mouth -skin rash -sweating -the feeling of extreme pressure in the head -unusually weak or tired Side effects that usually do not require medical attention (report to your doctor or health care professional if they continue or are bothersome): -flushing of the face or neck -headache -irregular heartbeat, palpitations -nausea, vomiting This list may not describe all possible side effects. Call your doctor for medical advice about side effects. You may report side effects to FDA at 1-800-FDA-1088. Where should I keep my medicine? Keep out of the reach of children. Store at room temperature between 20 and 25 degrees C (68 and 77 degrees F). Store in [facility] [employer]. Protect from light and moisture. Keep tightly closed. Throw away any unused medicine after the expiration date. NOTE: This sheet is a summary. It may not cover all possible information. If you have questions about this medicine, talk to your doctor, pharmacist, or health care provider.  2018 Elsevier/Gold Standard (2012-10-17 17:57:36)    Studies Ordered:   Orders Placed This Encounter  Procedures  . EKG 12-Lead      Lisa Mclaughlin, M.D., M.S. Interventional Cardiologist   Pager # [phone] Phone # [phone] [address] [place], [place] [address]

## 2016-08-04 NOTE — Patient Instructions (Addendum)
MEDICATION INSTRUCTIONS  MAY USE NITROGLYCERIN SUBLINGUAL TABLET FOR CHEST DISCOMFORT.FOLLOW BELOW INSTRUCTION   IF YOU USE  NTG MEDICATION , TAKE 2 TABLETS OF ISOSORBIDE DAILY FOR 2 DAYS AFTER EACH EPISODES   START TAKING ASPIRIN 81 MG DAILY  .NO OTHER CHANGES   Your physician wants you to follow-up in [place]. You will receive a reminder letter in the mail two months in advance. If you don't receive a letter, please call our office to schedule the follow-up appointment.   If you need a refill on your cardiac medications before your next appointment, please call your pharmacy.   Nitroglycerin sublingual tablets What is this medicine? NITROGLYCERIN (nye troe GLI ser in) is a type of vasodilator. It relaxes blood vessels, increasing the blood and oxygen supply to your heart. This medicine is used to relieve chest pain caused by angina. It is also used to prevent chest pain before activities like climbing stairs, going outdoors in cold weather, or sexual activity. This medicine may be used for other purposes; ask your health care provider or pharmacist if you have questions. COMMON BRAND NAME(S): Nitroquick, Nitrostat, Nitrotab What should I tell my health care provider before I take this medicine? They need to know if you have any of these conditions: -anemia -head injury, recent stroke, or bleeding in the brain -liver disease -previous heart attack -an unusual or allergic reaction to nitroglycerin, other medicines, foods, dyes, or preservatives -pregnant or trying to get pregnant -breast-feeding How should I use this medicine? Take this medicine by mouth as needed. At the first sign of an angina attack (chest pain or tightness) place one tablet under your tongue. You can also take this medicine 5 to 10 minutes before an event likely to produce chest pain. Follow the directions on the prescription label. Let the tablet dissolve under the tongue. Do not swallow whole.  Replace the dose if you accidentally swallow it. It will help if your mouth is not dry. Saliva around the tablet will help it to dissolve more quickly. Do not eat or drink, smoke or chew tobacco while a tablet is dissolving. If you are not better within 5 minutes after taking ONE dose of nitroglycerin, call 9-1-1 immediately to seek emergency medical care. Do not take more than 3 nitroglycerin tablets over 15 minutes. If you take this medicine often to relieve symptoms of angina, your doctor or health care professional may provide you with different instructions to manage your symptoms. If symptoms do not go away after following these instructions, it is important to call 9-1-1 immediately. Do not take more than 3 nitroglycerin tablets over 15 minutes. Talk to your pediatrician regarding the use of this medicine in children. Special care may be needed. Overdosage: If you think you have taken too much of this medicine contact a poison control center or emergency room at once. NOTE: This medicine is only for you. Do not share this medicine with others. What if I miss a dose? This does not apply. This medicine is only used as needed. What may interact with this medicine? Do not take this medicine with any of the following medications: -certain migraine medicines like ergotamine and dihydroergotamine (DHE) -medicines used to treat erectile dysfunction like sildenafil, tadalafil, and vardenafil -riociguat This medicine may also interact with the following medications: -alteplase -aspirin -heparin -medicines for high blood pressure -medicines for mental depression -other medicines used to treat angina -phenothiazines like chlorpromazine, mesoridazine, prochlorperazine, thioridazine This list may not describe all  possible interactions. Give your health care provider a list of all the medicines, herbs, non-prescription drugs, or dietary supplements you use. Also tell them if you smoke, drink alcohol, or  use illegal drugs. Some items may interact with your medicine. What should I watch for while using this medicine? Tell your doctor or health care professional if you feel your medicine is no longer working. Keep this medicine with you at all times. Sit or lie down when you take your medicine to prevent falling if you feel dizzy or faint after using it. Try to remain calm. This will help you to feel better faster. If you feel dizzy, take several deep breaths and lie down with your feet propped up, or bend forward with your head resting between your knees. You may get drowsy or dizzy. Do not drive, use machinery, or do anything that needs mental alertness until you know how this drug affects you. Do not stand or sit up quickly, especially if you are an older patient. This reduces the risk of dizzy or fainting spells. Alcohol can make you more drowsy and dizzy. Avoid alcoholic drinks. Do not treat yourself for coughs, colds, or pain while you are taking this medicine without asking your doctor or health care professional for advice. Some ingredients may increase your blood pressure. What side effects may I notice from receiving this medicine? Side effects that you should report to your doctor or health care professional as soon as possible: -blurred vision -dry mouth -skin rash -sweating -the feeling of extreme pressure in the head -unusually weak or tired Side effects that usually do not require medical attention (report to your doctor or health care professional if they continue or are bothersome): -flushing of the face or neck -headache -irregular heartbeat, palpitations -nausea, vomiting This list may not describe all possible side effects. Call your doctor for medical advice about side effects. You may report side effects to FDA at 1-800-FDA-1088. Where should I keep my medicine? Keep out of the reach of children. Store at room temperature between 20 and 25 degrees C (68 and 77 degrees F).  Store in [employer]. Protect from light and moisture. Keep tightly closed. Throw away any unused medicine after the expiration date. NOTE: This sheet is a summary. It may not cover all possible information. If you have questions about this medicine, talk to your doctor, pharmacist, or health care provider.  2018 Elsevier/Gold Standard (2012-10-17 17:57:36)

## 2016-08-06 ENCOUNTER — Encounter: Payer: Self-pay | Admitting: Cardiology

## 2016-08-06 NOTE — Assessment & Plan Note (Signed)
Would not be able to titrate Beta Blocker nay further.

## 2016-08-06 NOTE — Assessment & Plan Note (Addendum)
Plan per discussion with her husband it to minimize therapy & avoid invasive procedures. She is on a low dose BB, ASA & Imdur. Will provide Rx for SL NTG. --> if used, would double up Imdur x 2-3 days PRN. No statin to avoid compounding memory concerns.

## 2016-08-06 NOTE — Assessment & Plan Note (Signed)
May never truly know her symptoms & cause b/c she cannot provide accurate history. Consider SL NTG for bad episodes.  We did check an Echocardiogram - normal Fxn & no comment on significant Diastolic dysfunction.  No CHF Sx. Could be deconditioning.

## 2016-08-11 ENCOUNTER — Encounter: Payer: Self-pay | Admitting: Family Medicine

## 2016-08-11 ENCOUNTER — Ambulatory Visit (INDEPENDENT_AMBULATORY_CARE_PROVIDER_SITE_OTHER): Payer: Medicare Other | Admitting: Family Medicine

## 2016-08-11 VITALS — BP 114/70 | HR 60 | Temp 97.9°F | Ht 62.0 in | Wt 163.2 lb

## 2016-08-11 DIAGNOSIS — F0280 Dementia in other diseases classified elsewhere without behavioral disturbance: Secondary | ICD-10-CM | POA: Diagnosis not present

## 2016-08-11 DIAGNOSIS — G309 Alzheimer's disease, unspecified: Secondary | ICD-10-CM | POA: Diagnosis not present

## 2016-08-11 MED ORDER — LORAZEPAM 0.5 MG PO TABS: ORAL_TABLET | ORAL | 1 refills | Status: DC

## 2016-08-11 NOTE — Patient Instructions (Addendum)
Try the midday dose of lorazepam and update me next week.  Take care.  Glad to see you.

## 2016-08-11 NOTE — Progress Notes (Signed)
Anxiety and memory loss.  She had been more restless and trying to get out of the house.  She has caregivers at home, along with her husband.  They have alarms on the doors.   Her memory is worse in the meantime. Husband is supportive.   Currently using 1 tab of lorazepam at a time, BID.  It may work well enough for a period of time but the effect isn't long enough.  She is more anxious when husband is out of the home.   She has a new caregiver after a prolonged time with prev help, and that is a noted change.   She has seen cardiology, has rx for NTG to use prn.  She has some SOB but the plan was to avoid invasive testing given her overall situation.    No FCNAVD.  No new systemic sx.   PMH and SH reviewed  ROS: Per HPI unless specifically indicated in ROS section   Meds, vitals, and allergies reviewed.   GEN: nad, alert with flat affect and need direction to get down the hallway, prompting to follow direction.  She repeats "I'm fine" to questions.   HEENT: mucous membranes moist NECK: supple w/o LA CV: rrr.  PULM: ctab, no inc wob ABD: soft, +bs EXT: no edema SKIN: no acute rash

## 2016-08-11 NOTE — Assessment & Plan Note (Signed)
Likely causing more anxiety in the meantime.  With mult meds already in use to limit anxiety, would inc BZD.  Would try midday dose of BZD, new rx done (initial rx with typographic error and shredded), given to husband to fill.  Update me next week.  We'll go from there.  Continue routine safety measures in the home, as they have been doing. >25 minutes spent in face to face time with patient, >50% spent in counselling or coordination of care.

## 2016-08-22 ENCOUNTER — Telehealth: Payer: Self-pay

## 2016-08-22 NOTE — Telephone Encounter (Signed)
Pt's husband called to give update per your request... With the increased dose of lorazepam taken daily at 6:30am, 10:30am and 2:30pm... It has been working well.... But early evening 5-6pm pt is becoming anxious and takes a while to get her settled.... He requests that maybe 1/2 tab in the evening would help with this wearing off.Marland KitchenMarland KitchenWalgreens Chartered certified accountant... Please advise

## 2016-08-23 MED ORDER — LORAZEPAM 0.5 MG PO TABS: 0.2500 mg | ORAL_TABLET | Freq: Four times a day (QID) | ORAL | 1 refills | Status: DC | PRN

## 2016-08-23 NOTE — Telephone Encounter (Signed)
Agreed. Please call in new prescription. Thanks. Have them update me after trying that for a few days.

## 2016-08-23 NOTE — Addendum Note (Signed)
Addended by: Tonia Ghent on: 08/23/2016 12:02 PM   Modules accepted: Orders

## 2016-08-23 NOTE — Telephone Encounter (Signed)
Rx called to pharmacy as instructed. Patient's husband (on [place]) notified as instructed by telephone and verbalized understanding. Mr. Marcano stated that he will see how things go with the medication change and will call back the first of the week with an update.

## 2016-08-25 ENCOUNTER — Ambulatory Visit (INDEPENDENT_AMBULATORY_CARE_PROVIDER_SITE_OTHER): Payer: Medicare Other | Admitting: Neurology

## 2016-08-25 ENCOUNTER — Encounter: Payer: Self-pay | Admitting: Neurology

## 2016-08-25 VITALS — BP 110/80 | HR 64 | Ht 62.0 in | Wt 164.1 lb

## 2016-08-25 DIAGNOSIS — F0280 Dementia in other diseases classified elsewhere without behavioral disturbance: Secondary | ICD-10-CM

## 2016-08-25 DIAGNOSIS — G300 Alzheimer's disease with early onset: Secondary | ICD-10-CM

## 2016-08-25 NOTE — Progress Notes (Signed)
[facility]  Follow-up Visit   Date: 08/25/16    Lisa Mclaughlin MRN: [id number] DOB: [birth year]   Interim History: Lisa Mclaughlin is a 66 y.o. year-old right-handed Caucasian female of English ancestry with history of ADD (diagnosed, 42s) and depression (diagnosed 2012) returning for early onset Alzheimer's disease.  The patient was accompanied to the clinic by her husband.   History of present illness: Husband reports that memory problems have been ongoing since ~2010, described as impairments with short-term memory, misplacing things, and trying to complete tasks. However, in March of 2013, her symptoms dramatically worsened and his behavior concerned her husband. He says that she started acting like she was in a dream world. She became very depressed and obsessed with childhood memories where she was bullied as a young girl. She became paranoid that these girls were coming after her because of unpaid club dues. She started seeing a psychologist, Dr. Terrance Mass, who diagnosed her with depression. Several medication adjustments have been made and her depression is almost 100% better but her memory remains impaired. She is forgetting short-term memory of events, dates, and names. She feels that she is still able to function independently and does all of her ADLs. She stopped driving in 1740. If she focuses on things, she is able to recall things better.  In 2015, patient and her husband researched options and worked to supplements published in a [employer] study for dietary supplements in Alzheimer's disease.  They were also seeing integrative therapy from [facility] and staying active in an exercise program. She also started seeing Dr. Audria Nine at [facility] and was been started on other supplements. In the Spring of 2015, she saw Dr. Werner Lean at [facility] who agreed with diagnosis of Alzheimer's dementia. Because of their interest in clinical trials  related to supplements, he was give information on a trial in [facility]) which is the same that they are trying to replicate at home.  He recommended tapering the namenda but there was mild worsening in memory, so he restarted it.    Over the years, there has been gradual decline in memory, independent functioning, and agitation. In May, they started having a caregiver come 4-days per week staying 10-hours per day.  She is already taking wellbutrin 300mg , celexa 40mg , and abilify 2.5mg  daily for her depression/anxiety.    UPDATE 08/25/2016:   Over the past 6 months, there has been steady decline in functioning and memory.  She needs help with personal hygiene and toileting.  Her husband helps with bathing, dressing, and meal preparation. She is able to feed herself and sometimes will get something to eat from the fridge.  She is unable to perform an light duties around the home.   She continues to have a caregiver 4 days per week for 10 hours.    Since her lats visit, she was diagnosed with CAD with angina.  She gets short of breath with little activity.  Because of worsening anxiety, her lorazepam has been increased to 0.5mg  QID which has helped.    Medications:  Current Outpatient Prescriptions on File Prior to Visit  Medication Sig Dispense Refill  . ARIPiprazole ([name]) 5 MG tablet Take 0.5 tablets (2.5 mg total) by mouth daily.    Marland Kitchen aspirin 81 MG tablet Take 1 tablet (81 mg total) by mouth daily. 30 tablet 11  . buPROPion (WELLBUTRIN XL) 300 MG 24 hr tablet TAKE 1 TABLET BY MOUTH EVERY DAY 90 tablet  1  . carvedilol (COREG) 3.125 MG tablet TAKE 1 TABLET(3.125 MG) BY MOUTH TWICE DAILY 180 tablet 6  . Cholecalciferol (VITAMIN D3) 5000 units CAPS Take 5,000 Units by mouth daily.     . citalopram (CELEXA) 40 MG tablet TAKE 1 TABLET BY MOUTH EVERY DAY 90 tablet 1  . donepezil (ARICEPT) 23 MG TABS tablet Take 1 tablet (23 mg total) by mouth at bedtime. 30 tablet 11  . estrogens, conjugated,  (PREMARIN) 0.9 MG tablet Take 0.9 mg by mouth daily. Take daily for 21 days then do not take for 7 days.    Marland Kitchen LORazepam (ATIVAN) 0.5 MG tablet Take 0.5-1 tablets (0.25-0.5 mg total) by mouth 4 (four) times daily as needed for anxiety. 120 tablet 1  . MELATONIN PO Take 8 mg by mouth at bedtime.    . memantine (NAMENDA) 10 MG tablet TAKE 1 TABLET(10 MG) BY MOUTH TWICE DAILY 60 tablet 11  . Multiple Vitamins-Minerals (HIGH POTENCY MULTIVIT/MIN/IRON) TABS Take 1 tablet by mouth 2 (two) times daily.     . nitroGLYCERIN (NITROSTAT) 0.4 MG SL tablet Place 1 tablet (0.4 mg total) under the tongue every 5 (five) minutes as needed for chest pain. 30 tablet 6  . Omega-3 Fatty Acids (FISH OIL) 1000 MG CAPS Take 1 capsule by mouth every evening.     . progesterone (PROMETRIUM) 200 MG capsule Take 1 capsule by mouth at bedtime.  12  . isosorbide mononitrate (IMDUR) 30 MG 24 hr tablet Take 1 tablet (30 mg total) by mouth daily. 30 tablet 6   No current facility-administered medications on file prior to visit.     Allergies:  Allergies  Allergen Reactions  . Gluten Meal Other (See Comments)    Allergic sensitivity  . Other Other (See Comments)    Allergic sensitivity-Food allergies: almond, banana, casein, cheese, cola, egg white, flaxseed, gluten, malt, cow and goat milks, mushrooms, pineapple, salmon, sesame, [place], wheat, whey, bakers and brewers yeast, yogurt.   . Tetracycline Hcl Other (See Comments)    unknown     Review of Systems:  CONSTITUTIONAL: No fevers, chills, night sweats, or weight loss.   EYES: No visual changes or eye pain ENT: No hearing changes.  No history of nose bleeds.   RESPIRATORY: No cough, wheezing +shortness of breath.   CARDIOVASCULAR: Negative for chest pain, and palpitations.   GI: Negative for abdominal discomfort, blood in stools or black stools.  No recent change in bowel habits.   GU:  No history of incontinence.   MUSCLOSKELETAL: No history of joint pain or  swelling.  No myalgias.   SKIN: Negative for lesions, rash, and itching.   HEMATOLOGY/ONCOLOGY: Negative for prolonged bleeding, bruising easily, and swollen nodes.    ENDOCRINE: Negative for cold or heat intolerance, polydipsia or goiter.   PSYCH:  No depression or anxiety symptoms.   NEURO: As Above.   Vital Signs:  BP 110/80   Pulse 64   Ht 5\' 2"  (1.575 m)   Wt 164 lb 2 oz (74.4 kg)   SpO2 96%   BMI 30.02 kg/m   Neurological Exam: Mental status:  She is awake and appears distressed, slightly disheveled.  She is oriented to self and accurately identifies husband by first name, she does not know their last name, address, or DOB.  Affect is blunted.  She does not engage in conversation.  Cranial nerves: Face is symmetric  Motor strength is 5/5 throughout.  Normal tone.   Gait:  Finger tapping  intact. No tremor.  Reduced arm swing bilaterally, normal posture, stable and narrow-based gait.  Data: MRI brain 10/01/2012: Premature for age cerebral and cerebellar atrophy. Chronic microvascular ischemic change affecting the periventricular greater than subcortical white matter.  CT brain 06/07/2012: Mild premature atrophy. Chronic microvascular ischemic change. No acute intracranial findings.  Neuropsychiatric testing 08/09/2012, 09/05/2012: Serious deficits with in multiple cognitive domains suggestive of a severe global cognitive decline that far exceed what might be expected on the basis of depression and ADHD. 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  Labs:  05/24/2012:  TSH 0.53, B12 622  09/26/2012:  Vitamin E 0.8   IMPRESSION/PLAN: Early onset Alzheimer's dementia, severe (stage 5)  - She continues to have progressive decline in independent functioning and cognitive ability as expected  - She has an excellent and supportive husband who is able to keep her safe and monitored at home.   - She has a caregiver that comes 10hr/d x 4 days.  She is nearly dependent on all ADLs, except for feeding.  - Continue Aricept 23mg  daily and Namenda to 10mg  twice daily  - Discussed that she is medically optimized on medication and unfortunately, no new treatments are available  - Encouraged to try to stay active and start exercise program, as able  Return to clinic in 1 year   The duration of this appointment visit was 20 minutes of face-to-face time with the patient.  Greater than 50% of this time was spent in counseling, explanation of diagnosis, planning of further management, and coordination of care.   Thank you for allowing me to participate in patient's care.  If I can answer any additional questions, I would be pleased to do so.    Sincerely,    Donika K. Posey Pronto, DO

## 2016-08-25 NOTE — Patient Instructions (Signed)
Return to clinic in 1 year.

## 2016-10-13 ENCOUNTER — Ambulatory Visit: Payer: Medicare Other | Admitting: Neurology

## 2016-10-27 DIAGNOSIS — H2513 Age-related nuclear cataract, bilateral: Secondary | ICD-10-CM | POA: Diagnosis not present

## 2016-11-03 ENCOUNTER — Other Ambulatory Visit: Payer: Self-pay | Admitting: Family Medicine

## 2016-11-07 ENCOUNTER — Other Ambulatory Visit: Payer: Self-pay | Admitting: Cardiology

## 2016-11-07 NOTE — Telephone Encounter (Signed)
Rx(s) sent to pharmacy electronically.  

## 2016-12-15 ENCOUNTER — Encounter: Payer: Self-pay | Admitting: Family Medicine

## 2016-12-15 ENCOUNTER — Ambulatory Visit (INDEPENDENT_AMBULATORY_CARE_PROVIDER_SITE_OTHER): Payer: Medicare Other | Admitting: Family Medicine

## 2016-12-15 VITALS — BP 144/84 | HR 68 | Temp 97.9°F | Wt 160.8 lb

## 2016-12-15 DIAGNOSIS — F0280 Dementia in other diseases classified elsewhere without behavioral disturbance: Secondary | ICD-10-CM | POA: Diagnosis not present

## 2016-12-15 DIAGNOSIS — G309 Alzheimer's disease, unspecified: Secondary | ICD-10-CM

## 2016-12-15 DIAGNOSIS — Z23 Encounter for immunization: Secondary | ICD-10-CM

## 2016-12-15 DIAGNOSIS — R21 Rash and other nonspecific skin eruption: Secondary | ICD-10-CM

## 2016-12-15 MED ORDER — LORAZEPAM 0.5 MG PO TABS: 0.5000 mg | ORAL_TABLET | Freq: Four times a day (QID) | ORAL | 1 refills | Status: DC | PRN

## 2016-12-15 NOTE — Patient Instructions (Addendum)
Try taking lorazepam 0.5-0.75mg  per dose and see if that helps.  You may need the higher dose later in the day. See how that goes and please update me.   Use barrier cream as needed, after a BM.  Take care.  Glad to see you.

## 2016-12-15 NOTE — Progress Notes (Signed)
Memory loss.  She was able to go a wedding in October with extra help and that was overall successful.  Had used used lorazepam prn along with baseline meds and that has been episodically helpful.  Urinary incontinence, occ stool incontinence, likely related to her ongoing progressive dementia.  Noted urinary incontinence increasing in frequency. She doesn't have dysuria and we talked about timed/scheduled voiding to limit "accidents".    She is still getting anxious, esp more in the later afternoon. Taking BZD up to 4 doses a day, and it seems to work better with narrower dose window.  Still on baseline meds.    She had a week of cold/cough sx but better today.  Spouse had a mild cold recently.  No fevers.    She had some prev irritation in the gluteal crease that was treated with topical ointment.  Improved in the meantime.  Meds, vitals, and allergies reviewed.   ROS: Per HPI unless specifically indicated in ROS section   nad ncat Flat affect Speaks in 1-2 word sentences.  She can smile but overall her affect is blunted.  She is not oriented to time.  She cannot follow complex she does follow 1-2 step commands. Nasal exam slightly stuffy. Mmm Neck supple, no LA rrr ctab abd soft, not ttp Ext w/o edema.  Minimal irritation in the gluteal crease.

## 2016-12-17 DIAGNOSIS — R21 Rash and other nonspecific skin eruption: Secondary | ICD-10-CM | POA: Insufficient documentation

## 2016-12-17 NOTE — Assessment & Plan Note (Signed)
She is on her baseline medications for dementia along with [name] Wellbutrin and citalopram to try to help with her mood.  In spite of all of that she is still getting episodically anxious, especially in the later afternoon.  I think that she has progressive dementia and not an acute change.  Discussed with patient and husband.  Continue baseline meds but would increase her dose of benzodiazepine to try to help with anxiety.  This would not be the first line agent but she has exhausted multiple other medications and still has refractory anxiety.  Husband is trying to do everything he can at home to limit sources of agitation for the patient.  He is supportive.  She has good caregivers.  At this point still okay for outpatient follow-up. >25 minutes spent in face to face time with patient, >50% spent in counselling or coordination of care.

## 2016-12-17 NOTE — Assessment & Plan Note (Signed)
Okay to use barrier cream.  They had been using an over-the-counter aloe gel.  They can put the barrier cream on top of that if needed.  No ulceration.  Update me as needed.

## 2016-12-26 ENCOUNTER — Emergency Department (HOSPITAL_COMMUNITY): Payer: Medicare Other

## 2016-12-26 ENCOUNTER — Inpatient Hospital Stay (HOSPITAL_COMMUNITY): Payer: Medicare Other

## 2016-12-26 ENCOUNTER — Inpatient Hospital Stay (HOSPITAL_COMMUNITY)
Admission: EM | Admit: 2016-12-26 | Discharge: 2017-01-05 | DRG: 100 | Disposition: A | Discharge status: Skilled Nursing Facility | Payer: Medicare Other | Attending: Family Medicine | Admitting: Family Medicine

## 2016-12-26 ENCOUNTER — Encounter (HOSPITAL_COMMUNITY): Payer: Self-pay | Admitting: Emergency Medicine

## 2016-12-26 DIAGNOSIS — F0280 Dementia in other diseases classified elsewhere without behavioral disturbance: Secondary | ICD-10-CM

## 2016-12-26 DIAGNOSIS — J9601 Acute respiratory failure with hypoxia: Secondary | ICD-10-CM | POA: Diagnosis not present

## 2016-12-26 DIAGNOSIS — R0602 Shortness of breath: Secondary | ICD-10-CM | POA: Diagnosis not present

## 2016-12-26 DIAGNOSIS — R569 Unspecified convulsions: Secondary | ICD-10-CM

## 2016-12-26 DIAGNOSIS — Z91012 Allergy to eggs: Secondary | ICD-10-CM | POA: Diagnosis not present

## 2016-12-26 DIAGNOSIS — Z91013 Allergy to seafood: Secondary | ICD-10-CM | POA: Diagnosis not present

## 2016-12-26 DIAGNOSIS — F909 Attention-deficit hyperactivity disorder, unspecified type: Secondary | ICD-10-CM | POA: Diagnosis present

## 2016-12-26 DIAGNOSIS — M810 Age-related osteoporosis without current pathological fracture: Secondary | ICD-10-CM | POA: Diagnosis present

## 2016-12-26 DIAGNOSIS — Z881 Allergy status to other antibiotic agents status: Secondary | ICD-10-CM | POA: Diagnosis not present

## 2016-12-26 DIAGNOSIS — R2681 Unsteadiness on feet: Secondary | ICD-10-CM | POA: Diagnosis not present

## 2016-12-26 DIAGNOSIS — F13239 Sedative, hypnotic or anxiolytic dependence with withdrawal, unspecified: Secondary | ICD-10-CM | POA: Diagnosis present

## 2016-12-26 DIAGNOSIS — R0603 Acute respiratory distress: Secondary | ICD-10-CM | POA: Diagnosis not present

## 2016-12-26 DIAGNOSIS — Z79899 Other long term (current) drug therapy: Secondary | ICD-10-CM

## 2016-12-26 DIAGNOSIS — G309 Alzheimer's disease, unspecified: Secondary | ICD-10-CM

## 2016-12-26 DIAGNOSIS — G308 Other Alzheimer's disease: Secondary | ICD-10-CM | POA: Diagnosis not present

## 2016-12-26 DIAGNOSIS — K449 Diaphragmatic hernia without obstruction or gangrene: Secondary | ICD-10-CM | POA: Diagnosis not present

## 2016-12-26 DIAGNOSIS — R030 Elevated blood-pressure reading, without diagnosis of hypertension: Secondary | ICD-10-CM

## 2016-12-26 DIAGNOSIS — I10 Essential (primary) hypertension: Secondary | ICD-10-CM | POA: Diagnosis not present

## 2016-12-26 DIAGNOSIS — F419 Anxiety disorder, unspecified: Secondary | ICD-10-CM | POA: Diagnosis present

## 2016-12-26 DIAGNOSIS — E876 Hypokalemia: Secondary | ICD-10-CM

## 2016-12-26 DIAGNOSIS — Z91011 Allergy to milk products: Secondary | ICD-10-CM

## 2016-12-26 DIAGNOSIS — R451 Restlessness and agitation: Secondary | ICD-10-CM | POA: Diagnosis present

## 2016-12-26 DIAGNOSIS — Z91018 Allergy to other foods: Secondary | ICD-10-CM | POA: Diagnosis not present

## 2016-12-26 DIAGNOSIS — Z87891 Personal history of nicotine dependence: Secondary | ICD-10-CM

## 2016-12-26 DIAGNOSIS — R4182 Altered mental status, unspecified: Secondary | ICD-10-CM

## 2016-12-26 DIAGNOSIS — Z820 Family history of epilepsy and other diseases of the nervous system: Secondary | ICD-10-CM | POA: Diagnosis not present

## 2016-12-26 DIAGNOSIS — I361 Nonrheumatic tricuspid (valve) insufficiency: Secondary | ICD-10-CM | POA: Diagnosis not present

## 2016-12-26 DIAGNOSIS — Z9981 Dependence on supplemental oxygen: Secondary | ICD-10-CM | POA: Diagnosis not present

## 2016-12-26 DIAGNOSIS — F988 Other specified behavioral and emotional disorders with onset usually occurring in childhood and adolescence: Secondary | ICD-10-CM

## 2016-12-26 DIAGNOSIS — G40901 Epilepsy, unspecified, not intractable, with status epilepticus: Secondary | ICD-10-CM | POA: Diagnosis present

## 2016-12-26 DIAGNOSIS — F05 Delirium due to known physiological condition: Secondary | ICD-10-CM | POA: Diagnosis not present

## 2016-12-26 DIAGNOSIS — F418 Other specified anxiety disorders: Secondary | ICD-10-CM | POA: Diagnosis not present

## 2016-12-26 DIAGNOSIS — R410 Disorientation, unspecified: Secondary | ICD-10-CM

## 2016-12-26 DIAGNOSIS — J9811 Atelectasis: Secondary | ICD-10-CM | POA: Diagnosis not present

## 2016-12-26 DIAGNOSIS — I200 Unstable angina: Secondary | ICD-10-CM | POA: Diagnosis not present

## 2016-12-26 DIAGNOSIS — E559 Vitamin D deficiency, unspecified: Secondary | ICD-10-CM | POA: Diagnosis not present

## 2016-12-26 DIAGNOSIS — J9600 Acute respiratory failure, unspecified whether with hypoxia or hypercapnia: Secondary | ICD-10-CM

## 2016-12-26 DIAGNOSIS — M419 Scoliosis, unspecified: Secondary | ICD-10-CM | POA: Diagnosis not present

## 2016-12-26 DIAGNOSIS — Z7982 Long term (current) use of aspirin: Secondary | ICD-10-CM | POA: Diagnosis not present

## 2016-12-26 DIAGNOSIS — Z8249 Family history of ischemic heart disease and other diseases of the circulatory system: Secondary | ICD-10-CM | POA: Diagnosis not present

## 2016-12-26 DIAGNOSIS — Z79890 Hormone replacement therapy (postmenopausal): Secondary | ICD-10-CM

## 2016-12-26 DIAGNOSIS — G300 Alzheimer's disease with early onset: Secondary | ICD-10-CM | POA: Diagnosis not present

## 2016-12-26 DIAGNOSIS — D72829 Elevated white blood cell count, unspecified: Secondary | ICD-10-CM

## 2016-12-26 DIAGNOSIS — I5022 Chronic systolic (congestive) heart failure: Secondary | ICD-10-CM | POA: Diagnosis not present

## 2016-12-26 DIAGNOSIS — I2510 Atherosclerotic heart disease of native coronary artery without angina pectoris: Secondary | ICD-10-CM | POA: Diagnosis not present

## 2016-12-26 DIAGNOSIS — F319 Bipolar disorder, unspecified: Secondary | ICD-10-CM | POA: Diagnosis present

## 2016-12-26 DIAGNOSIS — M6281 Muscle weakness (generalized): Secondary | ICD-10-CM | POA: Diagnosis not present

## 2016-12-26 DIAGNOSIS — G4709 Other insomnia: Secondary | ICD-10-CM | POA: Diagnosis not present

## 2016-12-26 DIAGNOSIS — E7849 Other hyperlipidemia: Secondary | ICD-10-CM | POA: Diagnosis not present

## 2016-12-26 LAB — CBC
HEMATOCRIT: 41.8 % (ref 36.0–46.0)
HEMATOCRIT: 44.5 % (ref 36.0–46.0)
Hemoglobin: 14.3 g/dL (ref 12.0–15.0)
Hemoglobin: 15.2 g/dL — ABNORMAL HIGH (ref 12.0–15.0)
MCH: 32.2 pg (ref 26.0–34.0)
MCH: 32.3 pg (ref 26.0–34.0)
MCHC: 34.2 g/dL (ref 30.0–36.0)
MCHC: 34.2 g/dL (ref 30.0–36.0)
MCV: 94.1 fL (ref 78.0–100.0)
MCV: 94.5 fL (ref 78.0–100.0)
PLATELETS: 300 10*3/uL (ref 150–400)
PLATELETS: 294 10*3/uL (ref 150–400)
RBC: 4.44 MIL/uL (ref 3.87–5.11)
RBC: 4.71 MIL/uL (ref 3.87–5.11)
RDW: 13 % (ref 11.5–15.5)
RDW: 12.9 % (ref 11.5–15.5)
WBC: 11.6 10*3/uL — ABNORMAL HIGH (ref 4.0–10.5)
WBC: 20.7 10*3/uL — AB (ref 4.0–10.5)

## 2016-12-26 LAB — TRIGLYCERIDES: TRIGLYCERIDES: 122 mg/dL (ref <150)

## 2016-12-26 LAB — CREATININE, SERUM: Creatinine, Ser: 0.9 mg/dL (ref 0.44–1.00)

## 2016-12-26 LAB — BASIC METABOLIC PANEL
Anion gap: 10 (ref 5–15)
BUN: 10 mg/dL (ref 6–20)
CALCIUM: 8.9 mg/dL (ref 8.9–10.3)
CO2: 22 mmol/L (ref 22–32)
CREATININE: 0.9 mg/dL (ref 0.44–1.00)
Chloride: 104 mmol/L (ref 101–111)
GFR calc Af Amer: >60 mL/min (ref >60)
GLUCOSE: 117 mg/dL — AB (ref 65–99)
Potassium: 3.9 mmol/L (ref 3.5–5.1)
SODIUM: 136 mmol/L (ref 135–145)

## 2016-12-26 LAB — MRSA PCR SCREENING: MRSA BY PCR: NEGATIVE

## 2016-12-26 LAB — CBG MONITORING, ED: GLUCOSE-CAPILLARY: 125 mg/dL — AB (ref 65–99)

## 2016-12-26 MED ORDER — ORAL CARE MOUTH RINSE
15.0000 mL | OROMUCOSAL | Status: DC
  Administered 2016-12-26 – 2016-12-28 (×17): 15 mL via OROMUCOSAL

## 2016-12-26 MED ORDER — PROPOFOL 1000 MG/100ML IV EMUL
5.0000 ug/kg/min | INTRAVENOUS | Status: DC
  Administered 2016-12-26 – 2016-12-27 (×2): 10 ug/kg/min via INTRAVENOUS
  Administered 2016-12-27: 15 ug/kg/min via INTRAVENOUS
  Filled 2016-12-26 (×4): qty 100

## 2016-12-26 MED ORDER — SODIUM CHLORIDE 0.9 % IV SOLN
20.0000 mg/kg | Freq: Once | INTRAVENOUS | Status: AC
  Administered 2016-12-26: 1458 mg via INTRAVENOUS
  Filled 2016-12-26: qty 29.16

## 2016-12-26 MED ORDER — LORAZEPAM 2 MG/ML IJ SOLN
1.0000 mg | Freq: Once | INTRAMUSCULAR | Status: AC
Start: 2016-12-26 — End: 2016-12-26
  Administered 2016-12-26: 1 mg via INTRAVENOUS
  Filled 2016-12-26: qty 1

## 2016-12-26 MED ORDER — LORAZEPAM 1 MG PO TABS: 0.0000 mg | ORAL_TABLET | Freq: Two times a day (BID) | ORAL | Status: DC

## 2016-12-26 MED ORDER — DEXMEDETOMIDINE HCL IN NACL 200 MCG/50ML IV SOLN
0.4000 ug/kg/h | INTRAVENOUS | Status: DC
  Administered 2016-12-26: 0.4 ug/kg/h via INTRAVENOUS
  Filled 2016-12-26: qty 50

## 2016-12-26 MED ORDER — LORAZEPAM 2 MG/ML IJ SOLN
2.0000 mg | Freq: Once | INTRAMUSCULAR | Status: AC
  Administered 2016-12-26: 2 mg via INTRAVENOUS
  Filled 2016-12-26: qty 1

## 2016-12-26 MED ORDER — THIAMINE HCL 100 MG/ML IJ SOLN
100.0000 mg | Freq: Every day | INTRAMUSCULAR | Status: DC
  Administered 2016-12-26 – 2016-12-28 (×3): 100 mg via INTRAVENOUS
  Filled 2016-12-26 (×3): qty 2

## 2016-12-26 MED ORDER — LORAZEPAM 2 MG/ML IJ SOLN
1.0000 mg | Freq: Once | INTRAMUSCULAR | Status: AC
  Administered 2016-12-26: 1 mg via INTRAVENOUS
  Filled 2016-12-26: qty 1

## 2016-12-26 MED ORDER — PANTOPRAZOLE SODIUM 40 MG IV SOLR
40.0000 mg | INTRAVENOUS | Status: DC
  Administered 2016-12-26 – 2016-12-28 (×3): 40 mg via INTRAVENOUS
  Filled 2016-12-26 (×3): qty 40

## 2016-12-26 MED ORDER — ASPIRIN 81 MG PO CHEW: 324.0000 mg | CHEWABLE_TABLET | ORAL | Status: AC

## 2016-12-26 MED ORDER — LORAZEPAM 2 MG/ML IJ SOLN
INTRAMUSCULAR | Status: AC
  Filled 2016-12-26: qty 2

## 2016-12-26 MED ORDER — VITAMIN B-1 100 MG PO TABS: 100.0000 mg | ORAL_TABLET | Freq: Every day | ORAL | Status: DC

## 2016-12-26 MED ORDER — LOPERAMIDE HCL 2 MG PO CAPS: 2.0000 mg | ORAL_CAPSULE | ORAL | Status: AC | PRN

## 2016-12-26 MED ORDER — ENOXAPARIN SODIUM 40 MG/0.4ML ~~LOC~~ SOLN
40.0000 mg | SUBCUTANEOUS | Status: DC
  Administered 2016-12-27 – 2017-01-04 (×9): 40 mg via SUBCUTANEOUS
  Filled 2016-12-26 (×9): qty 0.4

## 2016-12-26 MED ORDER — THIAMINE HCL 100 MG/ML IJ SOLN: 100.0000 mg | Freq: Once | INTRAMUSCULAR | Status: DC

## 2016-12-26 MED ORDER — LORAZEPAM 1 MG PO TABS: 0.0000 mg | ORAL_TABLET | Freq: Four times a day (QID) | ORAL | Status: AC

## 2016-12-26 MED ORDER — SODIUM CHLORIDE 0.9 % IV SOLN: 250.0000 mL | INTRAVENOUS | Status: DC | PRN

## 2016-12-26 MED ORDER — SODIUM CHLORIDE 0.9 % IV SOLN
1000.0000 mg | Freq: Once | INTRAVENOUS | Status: AC
  Administered 2016-12-26: 1000 mg via INTRAVENOUS
  Filled 2016-12-26: qty 10

## 2016-12-26 MED ORDER — HYDROXYZINE HCL 25 MG PO TABS: 25.0000 mg | ORAL_TABLET | Freq: Four times a day (QID) | ORAL | Status: AC | PRN

## 2016-12-26 MED ORDER — SODIUM CHLORIDE 0.9 % IV SOLN
2.0000 mg/h | INTRAVENOUS | Status: DC
  Administered 2016-12-26: 0.5 mg/h via INTRAVENOUS
  Administered 2016-12-27: 2 mg/h via INTRAVENOUS
  Filled 2016-12-26 (×3): qty 10

## 2016-12-26 MED ORDER — VITAMIN B-1 100 MG PO TABS
100.0000 mg | ORAL_TABLET | Freq: Every day | ORAL | Status: DC
  Administered 2016-12-29 – 2017-01-05 (×7): 100 mg via ORAL
  Filled 2016-12-26 (×8): qty 1

## 2016-12-26 MED ORDER — ADULT MULTIVITAMIN W/MINERALS CH
1.0000 | ORAL_TABLET | Freq: Every day | ORAL | Status: DC
  Administered 2016-12-29 – 2017-01-05 (×7): 1 via ORAL
  Filled 2016-12-26 (×8): qty 1

## 2016-12-26 MED ORDER — LORAZEPAM 2 MG/ML IJ SOLN: 0.0000 mg | Freq: Two times a day (BID) | INTRAMUSCULAR | Status: DC

## 2016-12-26 MED ORDER — ONDANSETRON 4 MG PO TBDP: 4.0000 mg | ORAL_TABLET | Freq: Four times a day (QID) | ORAL | Status: AC | PRN

## 2016-12-26 MED ORDER — LORAZEPAM 2 MG/ML IJ SOLN
0.0000 mg | Freq: Four times a day (QID) | INTRAMUSCULAR | Status: AC
  Filled 2016-12-26: qty 1

## 2016-12-26 MED ORDER — ETOMIDATE 2 MG/ML IV SOLN
INTRAVENOUS | Status: AC | PRN
  Administered 2016-12-26: 20 mg via INTRAVENOUS

## 2016-12-26 MED ORDER — CHLORHEXIDINE GLUCONATE 0.12% ORAL RINSE (MEDLINE KIT)
15.0000 mL | Freq: Two times a day (BID) | OROMUCOSAL | Status: DC
  Administered 2016-12-26 – 2016-12-28 (×4): 15 mL via OROMUCOSAL

## 2016-12-26 MED ORDER — CHLORDIAZEPOXIDE HCL 25 MG PO CAPS: 25.0000 mg | ORAL_CAPSULE | Freq: Four times a day (QID) | ORAL | Status: DC | PRN

## 2016-12-26 MED ORDER — ASPIRIN 300 MG RE SUPP: 300.0000 mg | RECTAL | Status: AC

## 2016-12-26 MED ORDER — MIDAZOLAM HCL 5 MG/5ML IJ SOLN
INTRAMUSCULAR | Status: AC | PRN
  Administered 2016-12-26: 4 mg via INTRAVENOUS

## 2016-12-26 MED ORDER — CHLORDIAZEPOXIDE HCL 25 MG PO CAPS: 25.0000 mg | ORAL_CAPSULE | Freq: Three times a day (TID) | ORAL | Status: DC

## 2016-12-26 MED ORDER — DEXTROSE IN LACTATED RINGERS 5 % IV SOLN
INTRAVENOUS | Status: DC
  Administered 2016-12-26: 21:00:00 via INTRAVENOUS

## 2016-12-26 MED ORDER — FENTANYL CITRATE (PF) 100 MCG/2ML IJ SOLN
INTRAMUSCULAR | Status: AC
  Administered 2016-12-26: 100 ug
  Filled 2016-12-26: qty 2

## 2016-12-26 MED ORDER — ROCURONIUM BROMIDE 50 MG/5ML IV SOLN
INTRAVENOUS | Status: AC | PRN
  Administered 2016-12-26: 50 mg via INTRAVENOUS

## 2016-12-26 MED ORDER — LORAZEPAM 2 MG/ML IJ SOLN
0.5000 mg | Freq: Once | INTRAMUSCULAR | Status: AC
  Administered 2016-12-26: 0.5 mg via INTRAVENOUS
  Filled 2016-12-26: qty 1

## 2016-12-26 MED ORDER — MIDAZOLAM HCL 2 MG/2ML IJ SOLN
INTRAMUSCULAR | Status: AC
  Filled 2016-12-26: qty 4

## 2016-12-26 NOTE — ED Notes (Signed)
RN walked in to administer Ativan and patient began seizing. MD promptly at bedside, seizure lasted 1-2 minutes - tonic clonic like activity. Patient currently post-ictal.

## 2016-12-26 NOTE — ED Notes (Signed)
MD at bedside. 

## 2016-12-26 NOTE — Code Documentation (Signed)
Pt O2 sat 73%, MD bagging.

## 2016-12-26 NOTE — Progress Notes (Signed)
Overnight EEG initiated.  Dr. Leonel Ramsay aware.

## 2016-12-26 NOTE — ED Notes (Signed)
Respiratory and critical care dr was at bedside and had to place another tube in pt because balloon was popped,. Had to do another sedation with 4mg  of ativan and 100 of fentanyl for comfort.

## 2016-12-26 NOTE — Procedures (Signed)
Name: Lisa Mclaughlin MRN: [id number] DOB: [birth year]   PROCEDURE NOTE  Procedure:  Endotracheal intubation.  Indication:  Acute respiratory failure  Consent:  Consent was implied due to the emergency nature of the procedure.  Anesthesia:  A total of 100 mcg of fentanyl and 4 mg of Ativan were given.  Procedure summary:  This is exchange of tube over a bougie given that the patient was not able to hold her volume on the vent with significant air leak around the ET tube visible with bubbling secretions in her mouth and air sounds in her mouth and in her neck with no returning of volume on the ventilator.  Husband and son were at the bedside he consented for the procedure the procedure was done by myself with help from RT as the bougie was introduced to 30 cc and the old ET tube was removed the new ET tube size #8 was introduced with direct visualization by the glide scope patient tolerated the procedure well no complications to report x-rays pending CO2 exchanger confirmed position.  Complications:  No immediate complications were noted.  Hemodynamic parameters and oxygenation remained stable throughout the procedure.     Pulmonary and [place] Pager: [phone]  12/26/2016, 6:21 PM

## 2016-12-26 NOTE — ED Provider Notes (Signed)
[facility]GICAL [facility] Provider Note   CSN: [id number] Arrival date & time: 12/26/16  1006     History   Chief Complaint Chief Complaint  Patient presents with  . Seizures    HPI Lisa Mclaughlin is a 66 y.o. female.  HPI Patient with history of severe dementia presents with witnessed seizure-like activity this morning around 9 AM.  Patient is unable to contribute to history.  Level 5 caveat applies.  Husband is at bedside.  States patient was in the bathtub and started screaming.  She then became stiff with small amount of frothing of the mouth.  This lasted 2-3 minutes.  EMS was called.  Patient was at her baseline mental status at that point.  CBG was 147.  Patient does not have a history of seizures.  She is taking Ativan up to 0.75 mg 4 times daily.  Last dose was yesterday around 4 PM. Past Medical History:  Diagnosis Date  . ADD (attention deficit disorder with hyperactivity)    adult  . Alzheimer's disease, early onset   . Depression   . History of anemia   . Osteopenia 2002   dexa  . Osteoporosis   . Scoliosis     Patient Active Problem List   Diagnosis Date Noted  . Status epilepticus ([place]) 12/26/2016  . Altered mental state   . Rash 12/17/2016  . Cough 05/22/2016  . Coronary artery disease involving native coronary artery of native heart with angina pectoris ([place]) 04/14/2016  . Dyspnea on exertion 01/28/2016  . Bradycardia 01/28/2016  . Colon cancer screening 01/17/2016  . Medicare welcome visit 11/20/2015  . Advance care planning 11/20/2015  . SOB (shortness of breath) on exertion 11/20/2015  . Agitation 10/16/2014  . Edema leg 07/09/2014  . Back pain 11/14/2012  . Hot flashes 05/24/2012  . Orthostasis 11/24/2011  . Dementia in Alzheimer's disease 09/20/2011  . IBS (irritable bowel syndrome) 01/04/2011  . Vitamin D deficiency 10/04/2009  . FATIGUE 03/15/2009  . POSTMENOPAUSAL BLEEDING 05/05/2008  . HYPERLIPIDEMIA 10/14/2007  . SCOLIOSIS  10/14/2007  . Depression with anxiety 07/11/2007  . OSTEOPOROSIS 07/11/2007    Past Surgical History:  Procedure Laterality Date  . BREAST ENHANCEMENT SURGERY    . CARDIAC EVENT MONITOR  02/2016   Mostly sinus rhythm. Lowest heart rate recorded was 50 BPM. No A. fib noted. Heart rate did achieve the rate of 110 bpm. Lightheadedness and dizziness was associated with mostly sinus rhythm with occasional PVCs or mild sinus tachycardia. No pauses greater than 3 seconds noted.   Marland Kitchen CESAREAN SECTION     x 3  . CORNARY CTA-FFR  03/31/2016   distal LAD stenosis with an FFR of 0.77. No left main, circumflex or RCA disease noted.. --> PLAN - medical management  . DILATION AND CURETTAGE OF UTERUS  2015  . NASAL SINUS SURGERY    . NM MYOVIEW LTD  06/2004   No infarct or Ischemia  . Pelvic ultrasound  05/10   thickened endometrium and ? polyp vs fibroid  . TRANSTHORACIC ECHOCARDIOGRAM  02/11/2016   Normal LV size and function. EF 60-65%. No comment on diastolic function or any significant valvular lesion.    OB History    No data available       Home Medications    Prior to Admission medications   Medication Sig Start Date End Date Taking? Authorizing Provider  ARIPiprazole ([name]) 5 MG tablet Take 0.5 tablets (2.5 mg total) by mouth daily. 07/10/14  Yes Tonia Ghent, MD  aspirin 81 MG tablet Take 1 tablet (81 mg total) by mouth daily. 04/14/16  Yes Leonie Man, MD  buPROPion (WELLBUTRIN XL) 300 MG 24 hr tablet TAKE 1 TABLET BY MOUTH EVERY DAY 11/06/16  Yes Tonia Ghent, MD  carvedilol (COREG) 3.125 MG tablet TAKE 1 TABLET(3.125 MG) BY MOUTH TWICE DAILY 05/18/16  Yes Leonie Man, MD  Cholecalciferol (VITAMIN D3) 5000 units CAPS Take 5,000 Units by mouth daily.    Yes [provider]  citalopram (CELEXA) 40 MG tablet TAKE 1 TABLET BY MOUTH EVERY DAY 11/06/16  Yes Tonia Ghent, MD  donepezil (ARICEPT) 23 MG TABS tablet Take 1 tablet (23 mg total) by mouth at bedtime.  02/01/16  Yes Patel, Donika K, DO  estrogens, conjugated, (PREMARIN) 0.9 MG tablet Take 0.9 mg by mouth daily.    Yes [provider]  isosorbide mononitrate (IMDUR) 30 MG 24 hr tablet TAKE 1 TABLET(30 MG) BY MOUTH DAILY 11/07/16  Yes Leonie Man, MD  LORazepam (ATIVAN) 0.5 MG tablet Take 1-1.5 tablets (0.5-0.75 mg total) by mouth 4 (four) times daily as needed for anxiety. 12/15/16  Yes Tonia Ghent, MD  MELATONIN PO Take 8 mg by mouth at bedtime.   Yes [provider]  memantine (NAMENDA) 10 MG tablet TAKE 1 TABLET(10 MG) BY MOUTH TWICE DAILY 04/05/16  Yes Patel, Donika K, DO  Multiple Vitamins-Minerals (HIGH POTENCY MULTIVIT/MIN/IRON) TABS Take 1 tablet by mouth daily.    Yes [provider]  nitroGLYCERIN (NITROSTAT) 0.4 MG SL tablet Place 1 tablet (0.4 mg total) under the tongue every 5 (five) minutes as needed for chest pain. 08/04/16 12/26/16 Yes Leonie Man, MD  progesterone (PROMETRIUM) 200 MG capsule Take 1 capsule by mouth at bedtime. 09/22/14  Yes [provider]    Family History Family History  Problem Relation Age of Onset  . Heart disease Mother        CAD  . Alzheimer's disease Mother        Died, 64  . Heart disease Father         CAD MI at age 12  . Hypertension Father   . Hypertension Sister   . Cancer Other        leukemia  . Diabetes Sister   . Cancer Other        breast  . Breast cancer Other   . Colon cancer Neg Hx     Social History Social History   Tobacco Use  . Smoking status: Former Smoker    Last attempt to quit: 09/27/1979    Years since quitting: 37.2  . Smokeless tobacco: Never Used  Substance Use Topics  . Alcohol use: Yes    Alcohol/week: 0.0 oz    Comment: occassionally  . Drug use: No     Allergies   Gluten meal; Other; and Tetracycline hcl   Review of Systems Review of Systems  Unable to perform ROS: Dementia     Physical Exam Updated Vital Signs BP (!) 158/83   Pulse 81   Temp  99.2 F (37.3 C) (Axillary)   Resp (!) 24   Ht '5\' 3"'$  (1.6 m)   Wt 76.1 kg (167 lb 12.3 oz)   SpO2 98%   BMI 29.72 kg/m   Physical Exam  Constitutional: She appears well-developed and well-nourished. No distress.  HENT:  Head: Normocephalic and atraumatic.  Mouth/Throat: Oropharynx is clear and moist.  No intraoral  trauma.  Eyes: EOM are normal. Pupils are equal, round, and reactive to light.  Neck: Normal range of motion. Neck supple.  No meningismus or posterior midline cervical tenderness to palpation.  Cardiovascular: Normal rate and regular rhythm. Exam reveals no gallop and no friction rub.  No murmur heard. Pulmonary/Chest: Effort normal and breath sounds normal. No stridor. No respiratory distress. She has no wheezes. She has no rales. She exhibits no tenderness.  Abdominal: Soft. Bowel sounds are normal. There is no tenderness. There is no rebound and no guarding.  Musculoskeletal: Normal range of motion. She exhibits no edema or tenderness.  Neurological: She is alert.  Oriented to self.  Moving all extremities without focal weakness.  Sensation is grossly intact.  Mild tremor to bilateral upper extremities which has been states is chronic.  Skin: Skin is warm and dry. Capillary refill takes less than 2 seconds. No rash noted. No erythema.  Nursing note and vitals reviewed.    ED Treatments / Results  Labs (all labs ordered are listed, but only abnormal results are displayed) Labs Reviewed  BASIC METABOLIC PANEL - Abnormal; Notable for the following components:      Result Value   Glucose, Bld 117 (*)    All other components within normal limits  CBC - Abnormal; Notable for the following components:   WBC 11.6 (*)    All other components within normal limits  URINALYSIS, ROUTINE W REFLEX MICROSCOPIC - Abnormal; Notable for the following components:   APPearance TURBID (*)    Glucose, UA >=500 (*)    Hgb urine dipstick LARGE (*)    Protein, ur 100 (*)     Leukocytes, UA TRACE (*)    Bacteria, UA RARE (*)    All other components within normal limits  CBC - Abnormal; Notable for the following components:   WBC 20.7 (*)    Hemoglobin 15.2 (*)    All other components within normal limits  APTT - Abnormal; Notable for the following components:   aPTT 20 (*)    All other components within normal limits  BLOOD GAS, ARTERIAL - Abnormal; Notable for the following components:   pCO2 arterial 31.3 (*)    Acid-base deficit 2.4 (*)    All other components within normal limits  CBC WITH DIFFERENTIAL/PLATELET - Abnormal; Notable for the following components:   WBC 18.6 (*)    Hemoglobin 15.8 (*)    Neutro Abs 15.7 (*)    Monocytes Absolute 1.7 (*)    All other components within normal limits  GLUCOSE, CAPILLARY - Abnormal; Notable for the following components:   Glucose-Capillary 205 (*)    All other components within normal limits  COMPREHENSIVE METABOLIC PANEL - Abnormal; Notable for the following components:   Potassium 3.4 (*)    Glucose, Bld 140 (*)    Calcium 8.3 (*)    Total Protein 6.4 (*)    Albumin 3.1 (*)    All other components within normal limits  COMPREHENSIVE METABOLIC PANEL - Abnormal; Notable for the following components:   Potassium 3.1 (*)    Glucose, Bld 125 (*)    Calcium 8.0 (*)    Total Protein 5.6 (*)    Albumin 2.7 (*)    All other components within normal limits  CBC WITH DIFFERENTIAL/PLATELET - Abnormal; Notable for the following components:   WBC 13.6 (*)    Neutro Abs 11.0 (*)    Monocytes Absolute 1.6 (*)    All other components within normal  limits  BLOOD GAS, ARTERIAL - Abnormal; Notable for the following components:   pH, Arterial 7.465 (*)    pO2, Arterial 115 (*)    All other components within normal limits  CBG MONITORING, ED - Abnormal; Notable for the following components:   Glucose-Capillary 125 (*)    All other components within normal limits  MRSA PCR SCREENING  CULTURE, RESPIRATORY  (NON-EXPECTORATED)  CREATININE, SERUM  PROTIME-INR  TRIGLYCERIDES  MAGNESIUM  MAGNESIUM  PROTIME-INR  APTT    EKG  EKG Interpretation  Date/Time:  Tuesday December 26 2016 10:17:11 EST Ventricular Rate:  71 PR Interval:    QRS Duration: 79 QT Interval:  491 QTC Calculation: 534 R Axis:   138 Text Interpretation:  Right and left arm electrode reversal, interpretation assumes no reversal Sinus rhythm Ventricular premature complex Probable lateral infarct, age indeterminate Prolonged QT interval No significant change since last tracing Confirmed by Duffy Bruce [phone]) on 12/26/2016 4:07:25 PM       Radiology Ct Head Wo Contrast  Result Date: 12/26/2016 CLINICAL DATA:  Witnessed seizures 45 minutes ago. EXAM: CT HEAD WITHOUT CONTRAST TECHNIQUE: Contiguous axial images were obtained from the base of the skull through the vertex without intravenous contrast. COMPARISON:  MR brain 10/01/2012 FINDINGS: Brain: No evidence of acute infarction, hemorrhage, extra-axial collection, ventriculomegaly, or mass effect. Generalized cerebral atrophy. Periventricular white matter low attenuation likely secondary to microangiopathy. Vascular: No hyperdense vessels. No significant intracranial atherosclerotic disease. Skull: Negative for fracture or focal lesion. Sinuses/Orbits: Visualized portions of the orbits are unremarkable. Visualized portions of the paranasal sinuses and mastoid air cells are unremarkable. Other: None. IMPRESSION: 1.  No acute intracranial pathology. 2. Chronic microvascular disease and cerebral atrophy. Electronically Signed   By: Kathreen Devoid   On: 12/26/2016 19:40   Mr Brain Wo Contrast  Result Date: 12/28/2016 CLINICAL DATA:  Dementia, probable Alzheimer's.  Seizure EXAM: MRI HEAD WITHOUT CONTRAST TECHNIQUE: Multiplanar, multiecho pulse sequences of the brain and surrounding structures were obtained without intravenous contrast. COMPARISON:  CT head 12/26/2016, MRI  10/01/2012 FINDINGS: Brain: Advanced atrophy, with significant progression since 2014. Bilateral hippocampal atrophy has progressed as well and is likely related Alzheimer's disease. Negative for acute or chronic ischemia. Negative for hemorrhage or mass. No fluid collection or midline shift. Vascular: Normal arterial flow voids. Skull and upper cervical spine: Negative Sinuses/Orbits: Negative Other: None IMPRESSION: No acute intracranial abnormality Advanced atrophy, with progression since 2014. Bilateral hippocampal atrophy, consistent with Alzheimer's disease given the clinical assessment. Electronically Signed   By: Franchot Gallo M.D.   On: 12/28/2016 10:27   Dg Chest Port 1 View  Result Date: 12/28/2016 CLINICAL DATA:  66 year old female with a history of respiratory failure and seizure EXAM: PORTABLE CHEST 1 VIEW COMPARISON:  Multiple chest x-ray 12/26/2016 FINDINGS: Cardiomediastinal silhouette unchanged in size and contour. Low lung volumes with linear opacities at the lung bases. Blunting of the left costophrenic angle. Double density overlies the lower mediastinum. No pneumothorax.  No new confluent airspace disease. Endotracheal tube is unchanged in position terminating approximately 1.6 cm above the carina. IMPRESSION: Low lung volumes persist with basilar atelectasis/ consolidation. Possible left-sided pleural effusion. Unchanged endotracheal tube terminating 1.6 cm above the carina. Hiatal hernia Electronically Signed   By: Corrie Mckusick D.O.   On: 12/28/2016 07:52   Dg Chest Port 1 View  Result Date: 12/26/2016 CLINICAL DATA:  Endotracheal tube placement. EXAM: PORTABLE CHEST 1 VIEW COMPARISON:  Radiograph of same day.  CT scan of March 31, 2016. FINDINGS: Stable cardiomegaly. Atherosclerosis of thoracic aorta is noted. Endotracheal tube is in stable position with distal tip 1 cm above the carina. Nasogastric tube has been removed. No pneumothorax is noted. Minimal right basilar  subsegmental atelectasis is noted. Large hiatal hernia is noted. Probable left basilar atelectasis and effusion is noted. Right lower lobe nodule is noted which was present on prior CT scan. Bony thorax is unremarkable. IMPRESSION: Nasogastric tube has been removed. Endotracheal tube is stable in position. Aortic atherosclerosis. Mild bibasilar subsegmental atelectasis is noted. Large hiatal hernia is noted. Right lower lobe nodule is noted as described on prior CTs scan; follow-up CT or PET scan is recommended to rule out malignancy. Electronically Signed   By: Marijo Conception, M.D.   On: 12/26/2016 18:46   Dg Chest Port 1 View  Result Date: 12/26/2016 CLINICAL DATA:  Status post intubation. EXAM: PORTABLE CHEST 1 VIEW COMPARISON:  Radiograph of same day. FINDINGS: Stable cardiomegaly. No pneumothorax is noted. Nasogastric tube tip is seen in the mid esophagus. Endotracheal tube is in grossly good position with distal tip 2 cm above the carina. Mild bibasilar subsegmental atelectasis is noted. Bony thorax is unremarkable. IMPRESSION: Endotracheal tube in grossly good position. Distal tip of nasogastric tube is seen in expected position of mid esophagus. Advancement is recommended. Mild bibasilar subsegmental atelectasis. Electronically Signed   By: Marijo Conception, M.D.   On: 12/26/2016 18:03   Dg Chest Portable 1 View  Result Date: 12/26/2016 CLINICAL DATA:  Intubation.  Multiple seizures. EXAM: PORTABLE CHEST 1 VIEW COMPARISON:  Chest x-ray earlier same day 12:49 p.m., 03/23/2016 and earlier. CT heart 03/31/2016. FINDINGS: Endotracheal tube tip in the origin of the right mainstem bronchus. This should be withdrawn approximately 4-5 cm. Nasogastric tube tip within the hiatal hernia in the lower chest. Nodule in the right costophrenic angle as noted previously. Development of dense left lower lobe atelectasis since earlier today. Mild right basilar atelectasis which is unchanged. No new pulmonary  parenchymal abnormalities elsewhere. IMPRESSION: 1. Right mainstem bronchus intubation. The endotracheal tube should with withdrawn approximately 4-5 cm for appropriate positioning in the distal trachea. 2. Nasogastric tube tip within the hiatal hernia in the lower chest. 3. New dense left lower lobe atelectasis since earlier today. Stable mild right basilar atelectasis. 4. Right basilar lung nodule as noted previously. Electronically Signed   By: Evangeline Dakin M.D.   On: 12/26/2016 17:54   Dg Chest Port 1 View  Result Date: 12/26/2016 CLINICAL DATA:  Patient status post multiple seizures today. EXAM: PORTABLE CHEST 1 VIEW COMPARISON:  PA and lateral chest 03/31/2016.  CT chest 03/23/2016. FINDINGS: A nodule in the right lower lobe measures 1.4 cm transverse compared to 1.1 cm on the prior plain film of the chest. Lungs otherwise clear. Heart size is normal. Hiatal hernia is noted. IMPRESSION: No acute disease. Nodule in the right lower lobe which had measured 1.1 cm in diameter on the prior plain films measures 1.4 cm today. Consider follow-up PET CT scan. Hiatal hernia. Electronically Signed   By: Inge Rise M.D.   On: 12/26/2016 13:21    Procedures Procedures (including critical care time)  Medications Ordered in ED Medications  LORazepam (ATIVAN) injection 0-4 mg (0 mg Intravenous Not Given 12/28/16 0752)    Or  LORazepam (ATIVAN) tablet 0-4 mg ( Oral See Alternative 12/28/16 0752)  thiamine (VITAMIN B-1) tablet 100 mg ( Oral See Alternative 12/27/16 0939)    Or  thiamine (B-1)  injection 100 mg (100 mg Intravenous Given 12/27/16 0939)  midazolam (VERSED) 50 mg in sodium chloride 0.9 % 50 mL (1 mg/mL) infusion (0 mg/hr Intravenous Stopped 12/28/16 1000)  0.9 %  sodium chloride infusion (not administered)  aspirin chewable tablet 324 mg (not administered)    Or  aspirin suppository 300 mg (not administered)  enoxaparin (LOVENOX) injection 40 mg (40 mg Subcutaneous Given 12/27/16  1727)  multivitamin with minerals tablet 1 tablet (0 tablets Oral Hold 12/28/16 0956)  chlordiazePOXIDE (LIBRIUM) capsule 25 mg (not administered)  hydrOXYzine (ATARAX/VISTARIL) tablet 25 mg (not administered)  loperamide (IMODIUM) capsule 2-4 mg (not administered)  ondansetron (ZOFRAN-ODT) disintegrating tablet 4 mg (not administered)  chlordiazePOXIDE (LIBRIUM) capsule 25 mg (0 mg Oral Hold 12/28/16 0957)  propofol (DIPRIVAN) 1000 MG/100ML infusion (5 mcg/kg/min  72.9 kg Intravenous Rate/Dose Change 12/28/16 1030)  pantoprazole (PROTONIX) injection 40 mg (40 mg Intravenous Given 12/27/16 1844)  chlorhexidine gluconate (MEDLINE KIT) (PERIDEX) 0.12 % solution 15 mL (15 mLs Mouth Rinse Given 12/28/16 0755)  MEDLINE mouth rinse (15 mLs Mouth Rinse Given 12/28/16 0543)  0.9 %  sodium chloride infusion ( Intravenous Rate/Dose Verify 12/28/16 1000)  levETIRAcetam (KEPPRA) 500 mg in sodium chloride 0.9 % 100 mL IVPB (0 mg Intravenous Stopped 12/28/16 0956)  potassium chloride 20 MEQ/15ML (10%) solution 40 mEq (0 mEq Per Tube Hold 12/28/16 1040)  magnesium sulfate IVPB 2 g 50 mL (not administered)  LORazepam (ATIVAN) injection 0.5 mg (0.5 mg Intravenous Given 12/26/16 1106)  LORazepam (ATIVAN) injection 1 mg (1 mg Intravenous Given 12/26/16 1125)  levETIRAcetam (KEPPRA) 1,000 mg in sodium chloride 0.9 % 100 mL IVPB (0 mg Intravenous Stopped 12/26/16 1251)  LORazepam (ATIVAN) injection 1 mg (1 mg Intravenous Given 12/26/16 1213)  LORazepam (ATIVAN) injection 1 mg (1 mg Intravenous Given 12/26/16 1259)  LORazepam (ATIVAN) injection 1 mg (1 mg Intravenous Given 12/26/16 1331)  LORazepam (ATIVAN) injection 1 mg (1 mg Intravenous Given 12/26/16 1402)  LORazepam (ATIVAN) injection 2 mg (2 mg Intravenous Given 12/26/16 1434)  fosPHENYtoin (CEREBYX) 1,458 mg PE in sodium chloride 0.9 % 50 mL IVPB (0 mg PE/kg  72.9 kg Intravenous Stopped 12/26/16 1606)  midazolam (VERSED) 5 MG/5ML injection ( Intravenous  Canceled Entry 12/26/16 1715)  etomidate (AMIDATE) injection (20 mg Intravenous Given 12/26/16 1712)  rocuronium (ZEMURON) injection (50 mg Intravenous Given 12/26/16 1714)  fentaNYL (SUBLIMAZE) 100 MCG/2ML injection (100 mcg  Given 12/26/16 1820)    CRITICAL CARE Performed by: Julianne Rice Total critical care time: 70 minutes Critical care time was exclusive of separately billable procedures and treating other patients. Critical care was necessary to treat or prevent imminent or life-threatening deterioration. Critical care was time spent personally by me on the following activities: development of treatment plan with patient and/or surrogate as well as nursing, discussions with consultants, evaluation of patient's response to treatment, examination of patient, obtaining history from patient or surrogate, ordering and performing treatments and interventions, ordering and review of laboratory studies, ordering and review of radiographic studies, pulse oximetry and re-evaluation of patient's condition. Initial Impression / Assessment and Plan / ED Course  I have reviewed the triage vital signs and the nursing notes.  Pertinent labs & imaging results that were available during my care of the patient were reviewed by me and considered in my medical decision making (see chart for details).     Patient had 2 witnessed tonic-clonic seizure-like events.  Was agitated and confused after.  Given repeated doses of Ativan.  Discussed with  neurology.  Will see patient in the emergency department.  Patient loaded with IV Keppra.  Continue to have agitation and tachycardia despite multiple doses of IV Ativan.  Started on CIWA protocol due to concerns about benzodiazepine withdrawal.  Dr. Malen Gauze recommends also loading with Dilantin.  Requiring multiple doses of Ativan and CT head delayed.  Discussed with critical care regarding starting Precedex.  Patient had brief apneic episode with hypotension that  responded to bagging and gentle IV fluids.  Critical care to see in the emergency department. Final Clinical Impressions(s) / ED Diagnoses   Final diagnoses:  Altered mental state  Acute respiratory failure [facility])  Delirium    ED Discharge Orders    None       Julianne Rice, MD 12/28/16 1048

## 2016-12-26 NOTE — Progress Notes (Signed)
Same day progress note  Patient had to be intubated because of inability to protect airway.  No frank clinical seizures noted.  I will have the patient hooked onto LTM EEG as well as obtain routine spot EEG.  He has been loaded with Keppra 1 g IV and fosphenytoin 20 mg/kg IV x1. Continue with Keppra 500 twice daily for now  Neurology will follow with you

## 2016-12-26 NOTE — ED Notes (Signed)
Pt is still fighting and moving tremendously after being medicated. MD made aware and pt has not been to CT scan. Pt is not able to be still and family is having to hold her down.

## 2016-12-26 NOTE — ED Notes (Signed)
AT CT with pt now.

## 2016-12-26 NOTE — ED Triage Notes (Signed)
Per [facility]: Pt to ED from home following witnessed seizure about 45 minutes ago. No hx of seizures. Patient has dementia - husband was with her while she was taking a bath and states that patient screamed, went stiff, and began foaming at the mouth, all lasting approximately 2-3 minutes. Patient normally alert to self, recognizes husband - he states she is currently at her baseline, but she was anxious and more confused prior to EMS arrival. EMS VS: HR 70 regular, 128/70, 99% 2L O2 [place], CBG 147. 18g. LAC. No obvious injuries to tongue noted. Patient was incontinent, but SO states that is her baseline also.

## 2016-12-26 NOTE — H&P (Signed)
PULMONARY / CRITICAL CARE MEDICINE   Name: Lisa Mclaughlin MRN: [id number] DOB: [birth year]    ADMISSION DATE:  12/26/2016 CONSULTATION DATE: December 26, 2016  REFERRING MD: ED staff  CHIEF COMPLAINT: Seizures  HISTORY OF PRESENT ILLNESS:   Patient is 66 year old Caucasian female with past medical history of severe dementia with depression also history of agitation benzodiazepine dependent has been sleep for the past 2 days get down on the benzo happened today when she was about to go to the shower patient had witnessed seizure by her husband no evidence of head trauma no witnessed head trauma patient continued to seize by the time I saw her she was completely out in status epilepticus not responsive and hypotensive or hemodynamically unstable had a long discussion with the husband and the son who both made the decision about intubating her for short time to be able to do the CAT scan and further workup and restart her benzos the benzos has been decreased from 0.5 mg 5 times a day to the husband that given that the patient has been decreased with poor appetite.  PAST MEDICAL HISTORY :  She  has a past medical history of ADD (attention deficit disorder with hyperactivity), Alzheimer's disease, early onset, Depression, History of anemia, Osteopenia (2002), Osteoporosis, and Scoliosis.  PAST SURGICAL HISTORY: She  has a past surgical history that includes Cesarean section; Nasal sinus surgery; Breast enhancement surgery; Pelvic ultrasound (05/10); Dilation and curettage of uterus (2015); NM MYOVIEW LTD (06/2004); transthoracic echocardiogram (02/11/2016); CARDIAC EVENT MONITOR (02/2016); and CORNARY CTA-FFR (03/31/2016).  Allergies  Allergen Reactions  . Gluten Meal Other (See Comments)    Allergic sensitivity  . Other Other (See Comments)    Allergic sensitivity-Food allergies: almond, banana, casein, cheese, cola, egg white, flaxseed, gluten, malt, cow and goat milks, mushrooms, pineapple, salmon,  sesame, [place], wheat, whey, bakers and brewers yeast, yogurt.   . Tetracycline Hcl Other (See Comments)    unknown    No current facility-administered medications on file prior to encounter.    Current Outpatient Medications on File Prior to Encounter  Medication Sig  . ARIPiprazole ([name]) 5 MG tablet Take 0.5 tablets (2.5 mg total) by mouth daily.  Marland Kitchen aspirin 81 MG tablet Take 1 tablet (81 mg total) by mouth daily.  Marland Kitchen buPROPion (WELLBUTRIN XL) 300 MG 24 hr tablet TAKE 1 TABLET BY MOUTH EVERY DAY  . carvedilol (COREG) 3.125 MG tablet TAKE 1 TABLET(3.125 MG) BY MOUTH TWICE DAILY  . Cholecalciferol (VITAMIN D3) 5000 units CAPS Take 5,000 Units by mouth daily.   . citalopram (CELEXA) 40 MG tablet TAKE 1 TABLET BY MOUTH EVERY DAY  . donepezil (ARICEPT) 23 MG TABS tablet Take 1 tablet (23 mg total) by mouth at bedtime.  Marland Kitchen estrogens, conjugated, (PREMARIN) 0.9 MG tablet Take 0.9 mg by mouth daily.   . isosorbide mononitrate (IMDUR) 30 MG 24 hr tablet TAKE 1 TABLET(30 MG) BY MOUTH DAILY  . LORazepam (ATIVAN) 0.5 MG tablet Take 1-1.5 tablets (0.5-0.75 mg total) by mouth 4 (four) times daily as needed for anxiety.  Marland Kitchen MELATONIN PO Take 8 mg by mouth at bedtime.  . memantine (NAMENDA) 10 MG tablet TAKE 1 TABLET(10 MG) BY MOUTH TWICE DAILY  . Multiple Vitamins-Minerals (HIGH POTENCY MULTIVIT/MIN/IRON) TABS Take 1 tablet by mouth daily.   . nitroGLYCERIN (NITROSTAT) 0.4 MG SL tablet Place 1 tablet (0.4 mg total) under the tongue every 5 (five) minutes as needed for chest pain.  . progesterone (PROMETRIUM) 200 MG capsule Take  1 capsule by mouth at bedtime.    FAMILY HISTORY:  Her indicated that her mother is deceased. She indicated that her father is deceased. She reported the following about her paternal grandfather: AAA. She indicated that the status of her neg hx is unknown. She reported the following about one of her others: AAA.   SOCIAL HISTORY: She  reports that she quit smoking about 37  years ago. she has never used smokeless tobacco. She reports that she drinks alcohol. She reports that she does not use drugs.  REVIEW OF SYSTEMS:   Unable to obtain due to patient conditions  SUBJECTIVE:  Unable to give any history  VITAL SIGNS: BP 114/74   Pulse 75   Temp 98.8 F (37.1 C) (Oral)   Resp (!) 23   SpO2 99%   HEMODYNAMICS:    VENTILATOR SETTINGS: Vent Mode: PRVC FiO2 (%):  [100 %] 100 % Set Rate:  [14 bmp-18 bmp] 18 bmp Vt Set:  [440 mL-500 mL] 440 mL PEEP:  [5 cmH20] 5 cmH20  INTAKE / OUTPUT: I/O last 3 completed shifts: In: 150 [IV Piggyback:150] Out: -   PHYSICAL EXAMINATION: General: Patient was in status epilepticus Neuro: Patient was in status epilepticus.   HEENT:  atraumatic , no jaundice , dry mucous membranes  Cardiovascular:  Irregular irregular , ESM 2/6 in the aortic area  Lungs:  CTA bilateral , no wheezing or crackles  Abdomen:  Soft lax +BS , no tenderness . Musculoskeletal:  WNL , normal pulses  Skin:  No rash    LABS:  BMET Recent Labs  Lab 12/26/16 1041  NA 136  K 3.9  CL 104  CO2 22  BUN 10  CREATININE 0.90  GLUCOSE 117*    Electrolytes Recent Labs  Lab 12/26/16 1041  CALCIUM 8.9    CBC Recent Labs  Lab 12/26/16 1041  WBC 11.6*  HGB 14.3  HCT 41.8  PLT 300    Coag's No results for input(s): APTT, INR in the last 168 hours.  Sepsis Markers No results for input(s): LATICACIDVEN, PROCALCITON, O2SATVEN in the last 168 hours.  ABG No results for input(s): PHART, PCO2ART, PO2ART in the last 168 hours.  Liver Enzymes No results for input(s): AST, ALT, ALKPHOS, BILITOT, ALBUMIN in the last 168 hours.  Cardiac Enzymes No results for input(s): TROPONINI, PROBNP in the last 168 hours.  Glucose Recent Labs  Lab 12/26/16 1046  GLUCAP 125*    Imaging Dg Chest Port 1 View  Result Date: 12/26/2016 CLINICAL DATA:  Endotracheal tube placement. EXAM: PORTABLE CHEST 1 VIEW COMPARISON:  Radiograph of  same day.  CT scan of March 31, 2016. FINDINGS: Stable cardiomegaly. Atherosclerosis of thoracic aorta is noted. Endotracheal tube is in stable position with distal tip 1 cm above the carina. Nasogastric tube has been removed. No pneumothorax is noted. Minimal right basilar subsegmental atelectasis is noted. Large hiatal hernia is noted. Probable left basilar atelectasis and effusion is noted. Right lower lobe nodule is noted which was present on prior CT scan. Bony thorax is unremarkable. IMPRESSION: Nasogastric tube has been removed. Endotracheal tube is stable in position. Aortic atherosclerosis. Mild bibasilar subsegmental atelectasis is noted. Large hiatal hernia is noted. Right lower lobe nodule is noted as described on prior CTs scan; follow-up CT or PET scan is recommended to rule out malignancy. Electronically Signed   By: Marijo Conception, M.D.   On: 12/26/2016 18:46   Dg Chest Port 1 View  Result Date: 12/26/2016  CLINICAL DATA:  Status post intubation. EXAM: PORTABLE CHEST 1 VIEW COMPARISON:  Radiograph of same day. FINDINGS: Stable cardiomegaly. No pneumothorax is noted. Nasogastric tube tip is seen in the mid esophagus. Endotracheal tube is in grossly good position with distal tip 2 cm above the carina. Mild bibasilar subsegmental atelectasis is noted. Bony thorax is unremarkable. IMPRESSION: Endotracheal tube in grossly good position. Distal tip of nasogastric tube is seen in expected position of mid esophagus. Advancement is recommended. Mild bibasilar subsegmental atelectasis. Electronically Signed   By: Marijo Conception, M.D.   On: 12/26/2016 18:03   Dg Chest Portable 1 View  Result Date: 12/26/2016 CLINICAL DATA:  Intubation.  Multiple seizures. EXAM: PORTABLE CHEST 1 VIEW COMPARISON:  Chest x-ray earlier same day 12:49 p.m., 03/23/2016 and earlier. CT heart 03/31/2016. FINDINGS: Endotracheal tube tip in the origin of the right mainstem bronchus. This should be withdrawn approximately 4-5  cm. Nasogastric tube tip within the hiatal hernia in the lower chest. Nodule in the right costophrenic angle as noted previously. Development of dense left lower lobe atelectasis since earlier today. Mild right basilar atelectasis which is unchanged. No new pulmonary parenchymal abnormalities elsewhere. IMPRESSION: 1. Right mainstem bronchus intubation. The endotracheal tube should with withdrawn approximately 4-5 cm for appropriate positioning in the distal trachea. 2. Nasogastric tube tip within the hiatal hernia in the lower chest. 3. New dense left lower lobe atelectasis since earlier today. Stable mild right basilar atelectasis. 4. Right basilar lung nodule as noted previously. Electronically Signed   By: Evangeline Dakin M.D.   On: 12/26/2016 17:54   Dg Chest Port 1 View  Result Date: 12/26/2016 CLINICAL DATA:  Patient status post multiple seizures today. EXAM: PORTABLE CHEST 1 VIEW COMPARISON:  PA and lateral chest 03/31/2016.  CT chest 03/23/2016. FINDINGS: A nodule in the right lower lobe measures 1.4 cm transverse compared to 1.1 cm on the prior plain film of the chest. Lungs otherwise clear. Heart size is normal. Hiatal hernia is noted. IMPRESSION: No acute disease. Nodule in the right lower lobe which had measured 1.1 cm in diameter on the prior plain films measures 1.4 cm today. Consider follow-up PET CT scan. Hiatal hernia. Electronically Signed   By: Inge Rise M.D.   On: 12/26/2016 13:21        DISCUSSION: Given the patient mental status status epilepticus unresponsiveness and unable to protect her with had a discussion with the family and the decision was made to intubate her and send her for a CAT scan start her back on the benzos and reassess in 2-3 days for either extubation or withdrawal of care if they do not want her to be on lifelong support or prolonged support.  ASSESSMENT / PLAN:  PULMONARY Patient had extreme difficult airway with anterior airway fragmentation most  likely from aspiration at this point ET tube was exchanged twice given that the there was a leak and the balloon went down so we had to put another tube under bougie 4 8 with good return of the volume.  There is no indication for antibiotic if the patient does not have an infiltrate but I do have low threshold to start her if she started having white count requiring more oxygen or fever.  CARDIOVASCULAR At this point no active issues  RENAL Gentle hydration  GASTROINTESTINAL And I will place NG tube due to hiatal hernia NG tube keeps taken in the chest.  HEMATOLOGIC No active issues  INFECTIOUS No indication for antibiotic that  would have low threshold to start patient on antibiotic if she started having more requiring more oxygen and hypoxia fever or white count that is most likely related to aspiration.  ENDOCRINE No active issues  NEUROLOGIC Status epilepticus at this point patient needs stat CAT scan of the brain EEG is pending she was given fosphenytoin she was given Keppra she is on Versed and propofol drip for benzos withdrawal neuro is following this is most likely status post discussed related to either benzos withdrawal or antipsychotic medications.  And the CAT scans pending this needs to be followed up P:   RASS goal: -01  FAMILY  - Updates:   Patient is on DVT prophylaxis she will be on GI prophylaxis as well.   Critical care time 45 minutes.  Pulmonary and [place] Pager: [phone]  12/26/2016, 7:05 PM

## 2016-12-26 NOTE — Progress Notes (Signed)
Attempted to receive report from ED RN.  ED secretary said they were busy doing patient care and I was placed on hold.  After a few minutes, call was disconnected. Will wait to receive report. [place], [place]

## 2016-12-26 NOTE — ED Notes (Addendum)
Attempted OG tube three times and no placement

## 2016-12-26 NOTE — Progress Notes (Signed)
#  7.5 ETT exchanged to #8.0 ETT d/t blown cuff per Dr Milon Dikes. BBS auscultated. Positive color change.

## 2016-12-26 NOTE — Code Documentation (Signed)
Critical care MD at the bedside.

## 2016-12-26 NOTE — ED Notes (Signed)
waiting for Respiratory to go CT then to floor

## 2016-12-26 NOTE — ED Notes (Signed)
Critical care at bedside  

## 2016-12-26 NOTE — Progress Notes (Signed)
Prelim EEG read: no ongoing seizure or epileptiform ativity.   Roland Rack, MD [facility] [phone]  If 7pm- 7am, please page neurology on call as listed in [place].

## 2016-12-26 NOTE — Procedures (Signed)
Name: [name] MRN: [id number] DOB: [birth year]   PROCEDURE NOTE  Procedure:  Endotracheal intubation.  Indication: Status epilepticus  Consent:  Consent was implied due to the emergency nature of the procedure.  Anesthesia:  A total of 0 mg of Etomidate was given intravenously.  4 mg of Versed and 50 of rocuronium.   Procedure summary:  Appropriate equipment was assembled. The patient was identified as Lisa Mclaughlin and safety timeout was performed. The patient was placed supine, with head in sniffing position. After adequate level of anesthesia was achieved, a MAC 4 blade was inserted into the oropharynx and the vocal cords were visualized. A 7-1/2 endotracheal tube was inserted with difficulty so the tube has come out and we had to use a glide scope patient airway is extremely anterior with very friable tissue more than likely from chronic aspiration.  Patient had difficulty visualizing the vocal cords we had to use glide scope to intubate her for 7/2 difficulty and visualized going through the vocal cords. The stylette was removed and cuff inflated. Colorimetric change was noted on the CO2 meter. Breath sounds were heard over both lung fields equally. ETT was secured at 24 lip line.  Post procedure chest xray was ordered.  Complications:  No immediate complications were noted.  Hemodynamic parameters and oxygenation remained stable throughout the procedure.     Pulmonary and [place] Pager: [phone]  12/26/2016, 5:40 PM

## 2016-12-26 NOTE — Consult Note (Addendum)
Neurology Consultation  Reason for Consult: seizures Referring Physician: Dr Robbie Louis  CC: Seizure  History is obtained from: Patient's husband and son, chart, ED providers  HPI: Lisa Mclaughlin is a 66 y.o. female with a past medical history of early onset Alzheimer's which is pretty advanced, depression, who was brought into the hospital for witnessed seizure-like activity this morning. According to the husband, she slept through the whole night last night which is out of character for her because she usually wakes up 2-3 times daily use the bathroom or just wander around the house, which she did not do last night.  When he went to check on her this morning, she was laying in a pool of her own urine and he got her up and put her in the bathtub.  While she was getting the bath, she became stiff with her gaze to the right and then started to have generalized tonic-clonic activity.  This lasted for about a minute or 2.  He was able to safely get her back to bed and called 911 to bring her to the hospital.  She had at least 1-2 witnessed episodes of seizure activity in the emergency room requiring Ativan. At the time of this encounter, the patient seemed to be confused with her eyes open, she had a very worried expression on her face, and she was not following commands. At baseline, she is minimally conversant and able to tell her name and able to recognize her husband, which she was unable to do at the time of this encounter Husband denied any preceding illnesses flulike symptoms chest pain nausea vomiting diarrhea fevers chills.  ROS: Unable to obtain due to altered mental status.   Past Medical History:  Diagnosis Date  . ADD (attention deficit disorder with hyperactivity)    adult  . Alzheimer's disease, early onset   . Depression   . History of anemia   . Osteopenia 2002   dexa  . Osteoporosis   . Scoliosis     Family History  Problem Relation Age of Onset  . Heart disease Mother         CAD  . Alzheimer's disease Mother        Died, 59  . Heart disease Father         CAD MI at age 37  . Hypertension Father   . Hypertension Sister   . Cancer Other        leukemia  . Diabetes Sister   . Cancer Other        breast  . Breast cancer Other   . Colon cancer Neg Hx     Social History:   reports that she quit smoking about 37 years ago. she has never used smokeless tobacco. She reports that she drinks alcohol. She reports that she does not use drugs.   Medications No current facility-administered medications for this encounter.   Current Outpatient Medications:  .  ARIPiprazole ([name]) 5 MG tablet, Take 0.5 tablets (2.5 mg total) by mouth daily., Disp: , Rfl:  .  aspirin 81 MG tablet, Take 1 tablet (81 mg total) by mouth daily., Disp: 30 tablet, Rfl: 11 .  buPROPion (WELLBUTRIN XL) 300 MG 24 hr tablet, TAKE 1 TABLET BY MOUTH EVERY DAY, Disp: 90 tablet, Rfl: 1 .  carvedilol (COREG) 3.125 MG tablet, TAKE 1 TABLET(3.125 MG) BY MOUTH TWICE DAILY, Disp: 180 tablet, Rfl: 6 .  Cholecalciferol (VITAMIN D3) 5000 units CAPS, Take 5,000 Units by mouth daily. ,  Disp: , Rfl:  .  citalopram (CELEXA) 40 MG tablet, TAKE 1 TABLET BY MOUTH EVERY DAY, Disp: 90 tablet, Rfl: 1 .  donepezil (ARICEPT) 23 MG TABS tablet, Take 1 tablet (23 mg total) by mouth at bedtime., Disp: 30 tablet, Rfl: 11 .  estrogens, conjugated, (PREMARIN) 0.9 MG tablet, Take 0.9 mg by mouth daily. , Disp: , Rfl:  .  isosorbide mononitrate (IMDUR) 30 MG 24 hr tablet, TAKE 1 TABLET(30 MG) BY MOUTH DAILY, Disp: 30 tablet, Rfl: 7 .  LORazepam (ATIVAN) 0.5 MG tablet, Take 1-1.5 tablets (0.5-0.75 mg total) by mouth 4 (four) times daily as needed for anxiety., Disp: 150 tablet, Rfl: 1 .  MELATONIN PO, Take 8 mg by mouth at bedtime., Disp: , Rfl:  .  memantine (NAMENDA) 10 MG tablet, TAKE 1 TABLET(10 MG) BY MOUTH TWICE DAILY, Disp: 60 tablet, Rfl: 11 .  Multiple Vitamins-Minerals (HIGH POTENCY MULTIVIT/MIN/IRON) TABS, Take 1  tablet by mouth daily. , Disp: , Rfl:  .  nitroGLYCERIN (NITROSTAT) 0.4 MG SL tablet, Place 1 tablet (0.4 mg total) under the tongue every 5 (five) minutes as needed for chest pain., Disp: 30 tablet, Rfl: 6 .  progesterone (PROMETRIUM) 200 MG capsule, Take 1 capsule by mouth at bedtime., Disp: , Rfl: 12   Exam: Current vital signs: BP (!) 155/89   Pulse (!) 232   Temp 98.8 F (37.1 C) (Oral)   Resp 18   SpO2 (!) 86%  Vital signs in last 24 hours: Temp:  [98.8 F (37.1 C)] 98.8 F (37.1 C) (12/25 1018) Pulse Rate:  [69-232] 232 (12/25 1245) Resp:  [18-21] 18 (12/25 1100) BP: (132-162)/(60-89) 155/89 (12/25 1245) SpO2:  [86 %-99 %] 86 % (12/25 1245) General: Awake, alert, appears generally uncomfortable HEENT: Normocephalic, atraumatic, dry mucous membranes Lungs clear to auscultation Cardiovascular: S1-S2 heard regular rate rhythm Extremities warm well perfused Neurological Patient is awake, alert, mumbles some words.  Does not follow commands.  Speech is unintelligible. Cranial nerves: Pupils are equal round reactive light, response to threat bilaterally, face is symmetric Motor exam: Moves all 4 extremities strongly against gravity without any focal weakness.  She had increased tone in all 4 extremities -hypotonia versus paratonia. Sensory exam: Withdraws all 4 briskly to noxious edematous. Coordination: Cannot be tested as she did not cooperate. Gait exam was not performed for patient safety  Labs I have reviewed labs in epic and the results pertinent to this consultation are: Mild leukocytosis WBC count 11.6, BMP essentially unremarkable  CBC    Component Value Date/Time   WBC 11.6 (H) 12/26/2016 1041   RBC 4.44 12/26/2016 1041   HGB 14.3 12/26/2016 1041   HCT 41.8 12/26/2016 1041   PLT 300 12/26/2016 1041   MCV 94.1 12/26/2016 1041   MCH 32.2 12/26/2016 1041   MCHC 34.2 12/26/2016 1041   RDW 13.0 12/26/2016 1041   LYMPHSABS 1.4 01/14/2016 0926   MONOABS 0.6  01/14/2016 0926   EOSABS 0.1 01/14/2016 0926   BASOSABS 0.0 01/14/2016 0926    CMP     Component Value Date/Time   NA 136 12/26/2016 1041   K 3.9 12/26/2016 1041   CL 104 12/26/2016 1041   CO2 22 12/26/2016 1041   GLUCOSE 117 (H) 12/26/2016 1041   BUN 10 12/26/2016 1041   CREATININE 0.90 12/26/2016 1041   CREATININE 0.82 06/18/2015 1628   CALCIUM 8.9 12/26/2016 1041   PROT 6.3 01/14/2016 0926   ALBUMIN 3.7 01/14/2016 0926   AST 16 01/14/2016  0926   ALT 17 01/14/2016 0926   ALKPHOS 65 01/14/2016 0926   BILITOT 0.3 01/14/2016 0926   GFRNONAA >60 12/26/2016 1041   GFRAA >60 12/26/2016 1041    Imaging No imaging available to review at this time  Assessment:  66 year old woman with a past medical history of advanced early onset Alzheimer's dementia, brought into the hospital for witnessed seizure-like activity this morning. Her last known normal was last night.  She woke up this morning soiled in her urine.  Unclear if she had any seizures overnight but this morning around 9 AM she had a witnessed seizure while getting a bath.  She had another 1-2 episodes of witnessed seizure activity in the emergency room.  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 Check UA chest x-ray Discontinue bupropion as it can lower seizure threshold. Routine EEG MRI brain with and without contrast when stable Stat CT head to rule out any acute bleed or acute process. Maintain seizure precautions  Neurology will continue to follow with you.  Amie [place], MD [facility] [phone] If 7pm to 7am, please call on call as listed on [facility].

## 2016-12-26 NOTE — Progress Notes (Signed)
RT transported patient from ED to CT and up to 4N without any complications.

## 2016-12-26 NOTE — Procedures (Signed)
Intubation Procedure Note Lisa Mclaughlin [id number] 05/20/1950  Procedure: Intubation Indications: Airway protection and maintenance  Procedure Details Consent: Risks of procedure as well as the alternatives and risks of each were explained to the (patient/caregiver).  Consent for procedure obtained. Time Out: Verified patient identification, verified procedure, site/side was marked, verified correct patient position, special equipment/implants available, medications/allergies/relevent history reviewed, required imaging and test results available.  Performed  Maximum sterile technique was used including antiseptics, cap, gloves, gown, hand hygiene, mask and sheet.  3    Evaluation Hemodynamic Status: BP stable throughout; O2 sats: stable throughout Patient's Current Condition: stable Complications: No apparent complications Patient did tolerate procedure well. Chest X-ray ordered to verify placement.  CXR: pending.  Patient intubated with s3 glidescope and #7.5 ETT Lisa Mclaughlin 12/26/2016

## 2016-12-27 ENCOUNTER — Telehealth: Payer: Self-pay | Admitting: *Deleted

## 2016-12-27 DIAGNOSIS — R451 Restlessness and agitation: Secondary | ICD-10-CM

## 2016-12-27 LAB — CBC WITH DIFFERENTIAL/PLATELET
BASOS ABS: 0 10*3/uL (ref 0.0–0.1)
Basophils Relative: 0 %
EOS ABS: 0 10*3/uL (ref 0.0–0.7)
EOS PCT: 0 %
HCT: 45.8 % (ref 36.0–46.0)
Hemoglobin: 15.8 g/dL — ABNORMAL HIGH (ref 12.0–15.0)
LYMPHS PCT: 6 %
Lymphs Abs: 1.2 10*3/uL (ref 0.7–4.0)
MCH: 32.7 pg (ref 26.0–34.0)
MCHC: 34.5 g/dL (ref 30.0–36.0)
MCV: 94.8 fL (ref 78.0–100.0)
Monocytes Absolute: 1.7 10*3/uL — ABNORMAL HIGH (ref 0.1–1.0)
Monocytes Relative: 9 %
Neutro Abs: 15.7 10*3/uL — ABNORMAL HIGH (ref 1.7–7.7)
Neutrophils Relative %: 85 %
PLATELETS: 274 10*3/uL (ref 150–400)
RBC: 4.83 MIL/uL (ref 3.87–5.11)
RDW: 13.6 % (ref 11.5–15.5)
WBC: 18.6 10*3/uL — AB (ref 4.0–10.5)

## 2016-12-27 LAB — URINALYSIS, ROUTINE W REFLEX MICROSCOPIC
Bilirubin Urine: NEGATIVE
Glucose, UA: >=500 mg/dL — AB
Ketones, ur: NEGATIVE mg/dL
NITRITE: NEGATIVE
PH: 5 (ref 5.0–8.0)
Protein, ur: 100 mg/dL — AB
SPECIFIC GRAVITY, URINE: 1.024 (ref 1.005–1.030)
Squamous Epithelial / LPF: NONE SEEN

## 2016-12-27 LAB — BLOOD GAS, ARTERIAL
ACID-BASE DEFICIT: 2.4 mmol/L — AB (ref 0.0–2.0)
Bicarbonate: 21.1 mmol/L (ref 20.0–28.0)
DRAWN BY: 511331
FIO2: 40
MECHVT: 440 mL
O2 Saturation: 97.7 %
PEEP/CPAP: 5 cmH2O
PO2 ART: 103 mmHg (ref 83.0–108.0)
Patient temperature: 98.2
RATE: 18 resp/min
pCO2 arterial: 31.3 mmHg — ABNORMAL LOW (ref 32.0–48.0)
pH, Arterial: 7.443 (ref 7.350–7.450)

## 2016-12-27 LAB — COMPREHENSIVE METABOLIC PANEL
ALK PHOS: 82 U/L (ref 38–126)
ALT: 29 U/L (ref 14–54)
AST: 31 U/L (ref 15–41)
Albumin: 3.1 g/dL — ABNORMAL LOW (ref 3.5–5.0)
Anion gap: 13 (ref 5–15)
BILIRUBIN TOTAL: 0.9 mg/dL (ref 0.3–1.2)
BUN: 8 mg/dL (ref 6–20)
CALCIUM: 8.3 mg/dL — AB (ref 8.9–10.3)
CHLORIDE: 102 mmol/L (ref 101–111)
CO2: 23 mmol/L (ref 22–32)
Creatinine, Ser: 0.71 mg/dL (ref 0.44–1.00)
GFR calc Af Amer: >60 mL/min (ref >60)
Glucose, Bld: 140 mg/dL — ABNORMAL HIGH (ref 65–99)
Potassium: 3.4 mmol/L — ABNORMAL LOW (ref 3.5–5.1)
Sodium: 138 mmol/L (ref 135–145)
TOTAL PROTEIN: 6.4 g/dL — AB (ref 6.5–8.1)

## 2016-12-27 LAB — PROTIME-INR
INR: 1.1
PROTHROMBIN TIME: 14.1 s (ref 11.4–15.2)

## 2016-12-27 LAB — APTT: aPTT: 20 seconds — ABNORMAL LOW (ref 24–36)

## 2016-12-27 LAB — GLUCOSE, CAPILLARY: GLUCOSE-CAPILLARY: 205 mg/dL — AB (ref 65–99)

## 2016-12-27 LAB — MAGNESIUM: Magnesium: 1.7 mg/dL (ref 1.7–2.4)

## 2016-12-27 MED ORDER — SODIUM CHLORIDE 0.9 % IV SOLN
INTRAVENOUS | Status: DC
  Administered 2016-12-27 – 2017-01-01 (×6): via INTRAVENOUS

## 2016-12-27 MED ORDER — SODIUM CHLORIDE 0.9 % IV SOLN
500.0000 mg | Freq: Two times a day (BID) | INTRAVENOUS | Status: DC
  Administered 2016-12-27 – 2016-12-29 (×5): 500 mg via INTRAVENOUS
  Filled 2016-12-27 (×5): qty 5

## 2016-12-27 NOTE — Progress Notes (Signed)
LTM discontinued. Dr Edison Simon notified. No skin breakdown was seen.

## 2016-12-27 NOTE — Progress Notes (Addendum)
Neurology Progress Note   S:// Remains intubated No clinical seizure activity.   O:// Current vital signs: BP 121/76   Pulse 69   Temp 98.2 F (36.8 C) (Oral)   Resp 19   Ht 5' 3" (1.6 m)   Wt 74.5 kg (164 lb 3.9 oz)   SpO2 99%   BMI 29.09 kg/m   Vital signs in last 24 hours: Temp:  [98.2 F (36.8 C)-99.3 F (37.4 C)] 98.2 F (36.8 C) (12/26 0400) Pulse Rate:  [63-235] 69 (12/26 0800) Resp:  [15-27] 19 (12/26 0800) BP: (100-171)/(57-91) 121/76 (12/26 0800) SpO2:  [86 %-100 %] 99 % (12/26 0800) FiO2 (%):  [40 %-100 %] 40 % (12/26 0747) Weight:  [70.9 kg (156 lb 4.9 oz)-74.5 kg (164 lb 3.9 oz)] 74.5 kg (164 lb 3.9 oz) (12/26 0500) General: Well-developed well-nourished no acute distress H ENT: Normocephalic atraumatic EEG leads in place CVS: S1-S2 heard Chest clear to auscultation Abdomen nondistended nontender Neurological exam Patient is sedated and intubated on propofol and Versed. Spontaneously moving left more than right. Cranial nerves: Pupils equal round reactive to light, oculocephalics present, no blink to threat, facial symmetry difficult to ascertain with the tube. Motor exam: Withdraws symmetrically to noxious stim in all 4. Sensory exam as above   Medications  Current Facility-Administered Medications:  .  0.9 %  sodium chloride infusion, 250 mL, Intravenous, PRN, Etheleen Nicks, MD .  0.9 %  sodium chloride infusion, , Intravenous, Continuous, Deterding, Guadelupe Sabin, MD, Last Rate: 75 mL/hr at 12/27/16 0800 .  aspirin chewable tablet 324 mg, 324 mg, Oral, NOW **OR** aspirin suppository 300 mg, 300 mg, Rectal, NOW, Etheleen Nicks, MD .  chlordiazePOXIDE (LIBRIUM) capsule 25 mg, 25 mg, Oral, Q6H PRN, Etheleen Nicks, MD .  chlordiazePOXIDE (LIBRIUM) capsule 25 mg, 25 mg, Oral, TID, Etheleen Nicks, MD .  chlorhexidine gluconate (MEDLINE KIT) (PERIDEX) 0.12 % solution 15 mL, 15 mL, Mouth Rinse, BID, Etheleen Nicks, MD, 15 mL at 12/27/16 0801 .   enoxaparin (LOVENOX) injection 40 mg, 40 mg, Subcutaneous, Q24H, Etheleen Nicks, MD .  hydrOXYzine (ATARAX/VISTARIL) tablet 25 mg, 25 mg, Oral, Q6H PRN, Etheleen Nicks, MD .  levETIRAcetam (KEPPRA) 500 mg in sodium chloride 0.9 % 100 mL IVPB, 500 mg, Intravenous, Q12H, Amie Portland, MD, Last Rate: 420 mL/hr at 12/27/16 0830, 500 mg at 12/27/16 0830 .  loperamide (IMODIUM) capsule 2-4 mg, 2-4 mg, Oral, PRN, Etheleen Nicks, MD .  LORazepam (ATIVAN) injection 0-4 mg, 0-4 mg, Intravenous, Q6H, Stopped at 12/26/16 1453 **OR** LORazepam (ATIVAN) tablet 0-4 mg, 0-4 mg, Oral, Q6H, Julianne Rice, MD .  MEDLINE mouth rinse, 15 mL, Mouth Rinse, 10 times per day, Etheleen Nicks, MD, 15 mL at 12/27/16 6812 .  midazolam (VERSED) 50 mg in sodium chloride 0.9 % 50 mL (1 mg/mL) infusion, 2 mg/hr, Intravenous, Continuous, Etheleen Nicks, MD, Last Rate: 2 mL/hr at 12/27/16 0800, 2 mg/hr at 12/27/16 0800 .  multivitamin with minerals tablet 1 tablet, 1 tablet, Oral, Daily, Etheleen Nicks, MD .  ondansetron (ZOFRAN-ODT) disintegrating tablet 4 mg, 4 mg, Oral, Q6H PRN, Etheleen Nicks, MD .  pantoprazole (PROTONIX) injection 40 mg, 40 mg, Intravenous, Q24H, Etheleen Nicks, MD, 40 mg at 12/26/16 2004 .  propofol (DIPRIVAN) 1000 MG/100ML infusion, 5-80 mcg/kg/min, Intravenous, Titrated, Etheleen Nicks, MD, Last Rate: 4.4 mL/hr at 12/27/16 0830, 10 mcg/kg/min at 12/27/16 0830 .  thiamine (VITAMIN B-1) tablet 100 mg,  100 mg, Oral, Daily **OR** thiamine (B-1) injection 100 mg, 100 mg, Intravenous, Daily, Julianne Rice, MD, 100 mg at 12/26/16 1548 Labs CBC    Component Value Date/Time   WBC 18.6 (H) 12/27/2016 0245   RBC 4.83 12/27/2016 0245   HGB 15.8 (H) 12/27/2016 0245   HCT 45.8 12/27/2016 0245   PLT 274 12/27/2016 0245   MCV 94.8 12/27/2016 0245   MCH 32.7 12/27/2016 0245   MCHC 34.5 12/27/2016 0245   RDW 13.6 12/27/2016 0245   LYMPHSABS 1.2 12/27/2016 0245   MONOABS 1.7 (H) 12/27/2016 0245    EOSABS 0.0 12/27/2016 0245   BASOSABS 0.0 12/27/2016 0245    CMP     Component Value Date/Time   NA 136 12/26/2016 1041   K 3.9 12/26/2016 1041   CL 104 12/26/2016 1041   CO2 22 12/26/2016 1041   GLUCOSE 117 (H) 12/26/2016 1041   BUN 10 12/26/2016 1041   CREATININE 0.90 12/26/2016 1933   CREATININE 0.82 06/18/2015 1628   CALCIUM 8.9 12/26/2016 1041   PROT 6.3 01/14/2016 0926   ALBUMIN 3.7 01/14/2016 0926   AST 16 01/14/2016 0926   ALT 17 01/14/2016 0926   ALKPHOS 65 01/14/2016 0926   BILITOT 0.3 01/14/2016 0926   GFRNONAA >60 12/26/2016 1933   GFRAA >60 12/26/2016 1933    Imaging I have reviewed images in epic and the results pertinent to this consultation are: Noncontrast CT scan of the head shows stable ventricular megaly and no acute changes.  Assessment:  66 year old man with a past medical history of advanced dementia, brought into the hospital for witnessed seizure-like activity yesterday, continued to have another episode.  There was concern for clinical status epilepticus for which she was intubated for airway protection. She has no witnessed seizures since then.  She has been on propofol and Versed. Long-term EEG overnight-preliminary read negative for seizures. Formal read pending at this time.  Impression Seizures Evaluate for underlying structural brain abnormality Evaluate for stroke Toxic metabolic encephalopathy-secondary to medications   Recommendations: Continue Keppra 500 twice daily Continue with the current sedation per [facility] M. Discontinue [name] as that might be contributing to altered mental status. MRI of the brain after the EEG leads are taken off-I will recommend taking off the EEG leads once the official LTM EEG report is in the chart and shows no seizures. Maintain seizure precautions I had a detailed discussion with the family about her progressive dementia and the need to be seen by a memory disorders neurologist, possibly at an academic  center upon discharge. Social work consult for help with memory disorders follow-up Plan discussed with the [place] attending in person.  -- Amie Portland, MD [facility] [phone] If 7pm to 7am, please call on call as listed on AMION.

## 2016-12-27 NOTE — Progress Notes (Signed)
PULMONARY / CRITICAL CARE MEDICINE   Name: Luda Charbonneau MRN: [id number] DOB: Oct 21, 1950    ADMISSION DATE:  12/26/2016   HISTORY OF PRESENT ILLNESS:   Patient is 67 year old Caucasian female with past medical history of severe dementia with depression also history of agitation benzodiazepine dependent has been sleep for the past 2 days get down on the benzo happened today when she was about to go to the shower patient had witnessed seizure by her husband no evidence of head trauma no witnessed head trauma patient continued to seize by the time I saw her she was completely out in status epilepticus not responsive and hypotensive or hemodynamically unstable had a long discussion with the husband and the son who both made the decision about intubating her for short time to be able to do the CAT scan and further workup and restart her benzos the benzos has been decreased from 0.5 mg 5 times a day to the husband that given that the patient has been decreased with poor appetite.     PAST MEDICAL HISTORY :  She  has a past medical history of ADD (attention deficit disorder with hyperactivity), Alzheimer's disease, early onset, Depression, History of anemia, Osteopenia (2002), Osteoporosis, and Scoliosis.  PAST SURGICAL HISTORY: She  has a past surgical history that includes Cesarean section; Nasal sinus surgery; Breast enhancement surgery; Pelvic ultrasound (05/10); Dilation and curettage of uterus (2015); NM MYOVIEW LTD (06/2004); transthoracic echocardiogram (02/11/2016); CARDIAC EVENT MONITOR (02/2016); and CORNARY CTA-FFR (03/31/2016).  Allergies  Allergen Reactions  . Gluten Meal Other (See Comments)    Allergic sensitivity  . Other Other (See Comments)    Allergic sensitivity-Food allergies: almond, banana, casein, cheese, cola, egg white, flaxseed, gluten, malt, cow and goat milks, mushrooms, pineapple, salmon, sesame, [place], wheat, whey, bakers and brewers yeast, yogurt.   . Tetracycline Hcl  Other (See Comments)    unknown    No current facility-administered medications on file prior to encounter.    Current Outpatient Medications on File Prior to Encounter  Medication Sig  . ARIPiprazole ([name]) 5 MG tablet Take 0.5 tablets (2.5 mg total) by mouth daily.  Marland Kitchen aspirin 81 MG tablet Take 1 tablet (81 mg total) by mouth daily.  Marland Kitchen buPROPion (WELLBUTRIN XL) 300 MG 24 hr tablet TAKE 1 TABLET BY MOUTH EVERY DAY  . carvedilol (COREG) 3.125 MG tablet TAKE 1 TABLET(3.125 MG) BY MOUTH TWICE DAILY  . Cholecalciferol (VITAMIN D3) 5000 units CAPS Take 5,000 Units by mouth daily.   . citalopram (CELEXA) 40 MG tablet TAKE 1 TABLET BY MOUTH EVERY DAY  . donepezil (ARICEPT) 23 MG TABS tablet Take 1 tablet (23 mg total) by mouth at bedtime.  Marland Kitchen estrogens, conjugated, (PREMARIN) 0.9 MG tablet Take 0.9 mg by mouth daily.   . isosorbide mononitrate (IMDUR) 30 MG 24 hr tablet TAKE 1 TABLET(30 MG) BY MOUTH DAILY  . LORazepam (ATIVAN) 0.5 MG tablet Take 1-1.5 tablets (0.5-0.75 mg total) by mouth 4 (four) times daily as needed for anxiety.  Marland Kitchen MELATONIN PO Take 8 mg by mouth at bedtime.  . memantine (NAMENDA) 10 MG tablet TAKE 1 TABLET(10 MG) BY MOUTH TWICE DAILY  . Multiple Vitamins-Minerals (HIGH POTENCY MULTIVIT/MIN/IRON) TABS Take 1 tablet by mouth daily.   . nitroGLYCERIN (NITROSTAT) 0.4 MG SL tablet Place 1 tablet (0.4 mg total) under the tongue every 5 (five) minutes as needed for chest pain.  . progesterone (PROMETRIUM) 200 MG capsule Take 1 capsule by mouth at bedtime.    FAMILY  HISTORY:  Her indicated that her mother is deceased. She indicated that her father is deceased. She reported the following about her paternal grandfather: AAA. She indicated that the status of her neg hx is unknown. She reported the following about one of her others: AAA.   SOCIAL HISTORY: She  reports that she quit smoking about 37 years ago. she has never used smokeless tobacco. She reports that she drinks alcohol.  She reports that she does not use drugs.    VITAL SIGNS: BP 121/67   Pulse 68   Temp 98.4 F (36.9 C) (Axillary)   Resp 19   Ht 5\' 3"  (1.6 m)   Wt 164 lb 3.9 oz (74.5 kg)   SpO2 99%   BMI 29.09 kg/m   HEMODYNAMICS:    VENTILATOR SETTINGS: Vent Mode: PRVC FiO2 (%):  [40 %-100 %] 40 % Set Rate:  [14 bmp-18 bmp] 18 bmp Vt Set:  [440 mL-500 mL] 440 mL PEEP:  [5 cmH20] 5 cmH20 Plateau Pressure:  [15 cmH20-18 cmH20] 15 cmH20  INTAKE / OUTPUT: I/O last 3 completed shifts: In: 1202.9 [I.V.:1052.9; IV Piggyback:150] Out: 200 [Urine:200]  PHYSICAL EXAMINATION: General: Intubated sedated no seizure activity Neuro: Intubated sedated no seizure activity  HEENT:  atraumatic , no jaundice , dry mucous membranes  Cardiovascular:  Irregular irregular , ESM 2/6 in the aortic area  Lungs:  CTA bilateral , no wheezing or crackles  Abdomen:  Soft lax +BS , no tenderness . Musculoskeletal:  WNL , normal pulses  Skin:  No rash    LABS:  BMET Recent Labs  Lab 12/26/16 1041 12/26/16 1933  NA 136  --   K 3.9  --   CL 104  --   CO2 22  --   BUN 10  --   CREATININE 0.90 0.90  GLUCOSE 117*  --     Electrolytes Recent Labs  Lab 12/26/16 1041  CALCIUM 8.9    CBC Recent Labs  Lab 12/26/16 1041 12/26/16 1933 12/27/16 0245  WBC 11.6* 20.7* 18.6*  HGB 14.3 15.2* 15.8*  HCT 41.8 44.5 45.8  PLT 300 294 274    Coag's Recent Labs  Lab 12/27/16 0245  APTT 20*  INR 1.10    Sepsis Markers No results for input(s): LATICACIDVEN, PROCALCITON, O2SATVEN in the last 168 hours.  ABG Recent Labs  Lab 12/27/16 0345  PHART 7.443  PCO2ART 31.3*  PO2ART 103    Liver Enzymes No results for input(s): AST, ALT, ALKPHOS, BILITOT, ALBUMIN in the last 168 hours.  Cardiac Enzymes No results for input(s): TROPONINI, PROBNP in the last 168 hours.  Glucose Recent Labs  Lab 12/26/16 1046 12/27/16 0259  GLUCAP 125* 205*    Imaging Ct Head Wo Contrast  Result Date:  12/26/2016 CLINICAL DATA:  Witnessed seizures 45 minutes ago. EXAM: CT HEAD WITHOUT CONTRAST TECHNIQUE: Contiguous axial images were obtained from the base of the skull through the vertex without intravenous contrast. COMPARISON:  MR brain 10/01/2012 FINDINGS: Brain: No evidence of acute infarction, hemorrhage, extra-axial collection, ventriculomegaly, or mass effect. Generalized cerebral atrophy. Periventricular white matter low attenuation likely secondary to microangiopathy. Vascular: No hyperdense vessels. No significant intracranial atherosclerotic disease. Skull: Negative for fracture or focal lesion. Sinuses/Orbits: Visualized portions of the orbits are unremarkable. Visualized portions of the paranasal sinuses and mastoid air cells are unremarkable. Other: None. IMPRESSION: 1.  No acute intracranial pathology. 2. Chronic microvascular disease and cerebral atrophy. Electronically Signed   By: Kathreen Devoid  On: 12/26/2016 19:40   Dg Chest Port 1 View  Result Date: 12/26/2016 CLINICAL DATA:  Endotracheal tube placement. EXAM: PORTABLE CHEST 1 VIEW COMPARISON:  Radiograph of same day.  CT scan of March 31, 2016. FINDINGS: Stable cardiomegaly. Atherosclerosis of thoracic aorta is noted. Endotracheal tube is in stable position with distal tip 1 cm above the carina. Nasogastric tube has been removed. No pneumothorax is noted. Minimal right basilar subsegmental atelectasis is noted. Large hiatal hernia is noted. Probable left basilar atelectasis and effusion is noted. Right lower lobe nodule is noted which was present on prior CT scan. Bony thorax is unremarkable. IMPRESSION: Nasogastric tube has been removed. Endotracheal tube is stable in position. Aortic atherosclerosis. Mild bibasilar subsegmental atelectasis is noted. Large hiatal hernia is noted. Right lower lobe nodule is noted as described on prior CTs scan; follow-up CT or PET scan is recommended to rule out malignancy. Electronically Signed   By:  Marijo Conception, M.D.   On: 12/26/2016 18:46   Dg Chest Port 1 View  Result Date: 12/26/2016 CLINICAL DATA:  Status post intubation. EXAM: PORTABLE CHEST 1 VIEW COMPARISON:  Radiograph of same day. FINDINGS: Stable cardiomegaly. No pneumothorax is noted. Nasogastric tube tip is seen in the mid esophagus. Endotracheal tube is in grossly good position with distal tip 2 cm above the carina. Mild bibasilar subsegmental atelectasis is noted. Bony thorax is unremarkable. IMPRESSION: Endotracheal tube in grossly good position. Distal tip of nasogastric tube is seen in expected position of mid esophagus. Advancement is recommended. Mild bibasilar subsegmental atelectasis. Electronically Signed   By: Marijo Conception, M.D.   On: 12/26/2016 18:03   Dg Chest Portable 1 View  Result Date: 12/26/2016 CLINICAL DATA:  Intubation.  Multiple seizures. EXAM: PORTABLE CHEST 1 VIEW COMPARISON:  Chest x-ray earlier same day 12:49 p.m., 03/23/2016 and earlier. CT heart 03/31/2016. FINDINGS: Endotracheal tube tip in the origin of the right mainstem bronchus. This should be withdrawn approximately 4-5 cm. Nasogastric tube tip within the hiatal hernia in the lower chest. Nodule in the right costophrenic angle as noted previously. Development of dense left lower lobe atelectasis since earlier today. Mild right basilar atelectasis which is unchanged. No new pulmonary parenchymal abnormalities elsewhere. IMPRESSION: 1. Right mainstem bronchus intubation. The endotracheal tube should with withdrawn approximately 4-5 cm for appropriate positioning in the distal trachea. 2. Nasogastric tube tip within the hiatal hernia in the lower chest. 3. New dense left lower lobe atelectasis since earlier today. Stable mild right basilar atelectasis. 4. Right basilar lung nodule as noted previously. Electronically Signed   By: Evangeline Dakin M.D.   On: 12/26/2016 17:54   Dg Chest Port 1 View  Result Date: 12/26/2016 CLINICAL DATA:  Patient  status post multiple seizures today. EXAM: PORTABLE CHEST 1 VIEW COMPARISON:  PA and lateral chest 03/31/2016.  CT chest 03/23/2016. FINDINGS: A nodule in the right lower lobe measures 1.4 cm transverse compared to 1.1 cm on the prior plain film of the chest. Lungs otherwise clear. Heart size is normal. Hiatal hernia is noted. IMPRESSION: No acute disease. Nodule in the right lower lobe which had measured 1.1 cm in diameter on the prior plain films measures 1.4 cm today. Consider follow-up PET CT scan. Hiatal hernia. Electronically Signed   By: Inge Rise M.D.   On: 12/26/2016 13:21     DISCUSSION: Patient 66 years old who presented with altered status changes possibility of procedure was given Keppra and fosphenytoin and Ativan patient became altered  had to be intubated airway had to be revised twice given that the patient had a balloon that is down the first ET tube with extremely difficult intubation family wants to try to keep her intubated only for short period of time if needed and after that they want to consider comfort care if she does not get better extubated EEG showed no seizure activity she is on Keppra and she is on Versed and propofol at this point she is waiting for an MRI.  We will try to extubate her immediately after that if she is appropriate and cooperative.  ASSESSMENT / PLAN:  PULMONARY Patient had extreme difficult airway with anterior airway fragmentation most likely from aspiration at this point ET tube was exchanged twice given that the there was a leak and the balloon went down so we had to put another tube under bougie 4 8 with good return of the volume.  There is no indication for antibiotic if the patient does not have an infiltrate but I do have low threshold to start her if she started having white count requiring more oxygen or fever.  CARDIOVASCULAR At this point no active issues  RENAL Gentle hydration  GASTROINTESTINAL NG tube was not able to be  placed given that the patient has large hiatal hernia  HEMATOLOGIC No active issues  INFECTIOUS No indication for antibiotic that would have low threshold to start patient on antibiotic if she started having more requiring more oxygen and hypoxia fever or white count that is most likely related to aspiration.  ENDOCRINE No active issues  NEUROLOGIC Seizure with mental status change combination of advanced dementia neuropsych disorder [name] medication side effects and benzos withdrawal continue with the benzos once the patient gets her MRI and final reading of the EEG will try to extubate her. RASS goal: -01  FAMILY  - Updates:   Patient is on DVT prophylaxis she will be on GI prophylaxis as well.   Critical care time 35 minutes.         Pulmonary and [place] Pager: [phone]  12/27/2016, 9:31 AM

## 2016-12-27 NOTE — Progress Notes (Signed)
Initial Nutrition Assessment  DOCUMENTATION CODES:   Not applicable  INTERVENTION:   -If unable to extubate within 48 hours, recommend: initiate TF with Vital AF 1.2 at goal rate of 55 ml/h (1320 ml per day) to provide1584 kcals, 90 gm protein, 1071 ml free water daily. Pt will receive 1751 kcals with inclusion of propofol at current rate.   NUTRITION DIAGNOSIS:   Inadequate oral intake related to inability to eat as evidenced by NPO status.  GOAL:   Patient will meet greater than or equal to 90% of their needs  MONITOR:   Vent status, Labs, Weight trends, Skin, I & O's  REASON FOR ASSESSMENT:   Ventilator    ASSESSMENT:   Patient is 66 year old Caucasian female with past medical history of severe dementia with depression, agitation (benzodiazepine dependent). She was about to go to the shower when patient had witnessed seizure by her husband (no evidence of head trauma, no witnessed head trauma). Patient continued to seize by the time she was seen by MD. She was completely out in status epilepticus, not responsive and hypotensive or hemodynamically unstable. MD had a long discussion with the husband and the son who both made the decision about intubating her for short time to be able to do the CAT scan and further workup.  Patient is currently intubated on ventilator support.  MV: 12.3 L/min Temp (24hrs), Avg:98.7 F (37.1 C), Min:98.2 F (36.8 C), Max:99.3 F (37.4 C)  Propofol: 6.6 ml/hr (174 kcals daily)  Case discussed with RN, who reports that pt was a difficult intubation. Pt has no feeding access (unable to place NGT due to large hiatal hernia). No plans to start nutrition today with potential extubation tomorrow (12/29/16).   Pt agitated at time of visit (was trying to grab at OGT when OT assessed). Unable to complete Nutrition-Focused physical exam at this time.   Wt has been stable over the past year.   Labs reviewed: K: 3.4, CBGS: 205.   Diet Order:  Diet  NPO time specified  EDUCATION NEEDS:   Not appropriate for education at this time  Skin:  Skin Assessment: Reviewed RN Assessment  Last BM:  PTA  Height:   Ht Readings from Last 1 Encounters:  12/26/16 5\' 3"  (1.6 m)    Weight:   Wt Readings from Last 1 Encounters:  12/27/16 164 lb 3.9 oz (74.5 kg)    Ideal Body Weight:  52.3 kg  BMI:  Body mass index is 29.09 kg/m.  Estimated Nutritional Needs:   Kcal:  1623  Protein:  95-110 grams  Fluid:  1.6-1.8 L    Landen Knoedler A. Jimmye Norman, RD, LDN, CDE Pager: [phone] After hours Pager: [phone]

## 2016-12-27 NOTE — Telephone Encounter (Signed)
Received fax from [facility] requesting PA for Lorazepam 0.5 mg tablets.  PA completed on CoverMyMeds.  [employer] is reviewing the PA request. Typically an electronic response will be received within 72 hours.

## 2016-12-27 NOTE — Progress Notes (Signed)
Foley catheter noted to be leaking significantly. Balloon checked.Urine still leaking around catheter at next check. Foley catheter removed. Purewick placed. Oncoming RN notified to monitor for retention.

## 2016-12-27 NOTE — Procedures (Signed)
Name:   Lisa Mclaughlin, Steinmetz MRN:    [id number] Date of Birth:  1950-12-08  Study Duration: 12/26/2016 20:03 to 12/27/2016 16:10 CPT Code:  40981 Diagnosis:  Altered mental status (R41.82); Seizures (R56.9)  History: This is a 66 year old female presenting with altered mental status and seizures.  Video-EEG monitoring was performed to evaluate for seizures.  Technical Details:  Long-term video-EEG monitoring was performed using standard setting per the guidelines.  Briefly, a minimum of 21 electrodes were placed on scalp according to the International 10-20 or/and 10-10 Systems.  Supplemental electrodes were placed as needed.  Single EKG electrode was also used to detect cardiac arrhythmia.  Patient's behavior was continuously recorded on video simultaneously with EEG.  A minimum of 16 channels were used for data display.  Each epoch of study was reviewed manually daily and as needed using standard referential and bipolar montages.    EEG Description:  There was generalized polymorphic delta slowing.  No posterior dominant rhythm or sleep architecture was recorded.  Diffuse beta activity was present, which was most likely due to medication effect (such as propofol, benzodiazepine).  No epileptiform discharges or seizures were in evidence.    Impression:  This is an abnormal EEG due to the presence of the generalized polymorphic delta slowing, suggesting severe encephalopathy, but medication effect (such as propofol, benzodiazepine) could not be excluded.  No epileptiform discharges or seizures were in evidence.     Reading Physician: Winfield Cunas, MD, PhD

## 2016-12-28 ENCOUNTER — Inpatient Hospital Stay (HOSPITAL_COMMUNITY): Payer: Medicare Other

## 2016-12-28 DIAGNOSIS — J9601 Acute respiratory failure with hypoxia: Secondary | ICD-10-CM

## 2016-12-28 DIAGNOSIS — E876 Hypokalemia: Secondary | ICD-10-CM

## 2016-12-28 LAB — BLOOD GAS, ARTERIAL
Acid-Base Excess: 0.2 mmol/L (ref 0.0–2.0)
BICARBONATE: 23.5 mmol/L (ref 20.0–28.0)
DRAWN BY: 511911
FIO2: 40
MECHVT: 440 mL
O2 SAT: 98.3 %
PATIENT TEMPERATURE: 98.6
PCO2 ART: 33.1 mmHg (ref 32.0–48.0)
PEEP: 5 cmH2O
PO2 ART: 115 mmHg — AB (ref 83.0–108.0)
RATE: 18 resp/min
pH, Arterial: 7.465 — ABNORMAL HIGH (ref 7.350–7.450)

## 2016-12-28 LAB — CBC WITH DIFFERENTIAL/PLATELET
BASOS ABS: 0 10*3/uL (ref 0.0–0.1)
Basophils Relative: 0 %
Eosinophils Absolute: 0 10*3/uL (ref 0.0–0.7)
Eosinophils Relative: 0 %
HEMATOCRIT: 42.2 % (ref 36.0–46.0)
HEMOGLOBIN: 14.3 g/dL (ref 12.0–15.0)
LYMPHS ABS: 1 10*3/uL (ref 0.7–4.0)
LYMPHS PCT: 8 %
MCH: 32.1 pg (ref 26.0–34.0)
MCHC: 33.9 g/dL (ref 30.0–36.0)
MCV: 94.8 fL (ref 78.0–100.0)
Monocytes Absolute: 1.6 10*3/uL — ABNORMAL HIGH (ref 0.1–1.0)
Monocytes Relative: 12 %
NEUTROS ABS: 11 10*3/uL — AB (ref 1.7–7.7)
NEUTROS PCT: 80 %
PLATELETS: 231 10*3/uL (ref 150–400)
RBC: 4.45 MIL/uL (ref 3.87–5.11)
RDW: 13.4 % (ref 11.5–15.5)
WBC: 13.6 10*3/uL — AB (ref 4.0–10.5)

## 2016-12-28 LAB — COMPREHENSIVE METABOLIC PANEL
ALBUMIN: 2.7 g/dL — AB (ref 3.5–5.0)
ALT: 22 U/L (ref 14–54)
ANION GAP: 10 (ref 5–15)
AST: 24 U/L (ref 15–41)
Alkaline Phosphatase: 80 U/L (ref 38–126)
BILIRUBIN TOTAL: 0.8 mg/dL (ref 0.3–1.2)
BUN: 6 mg/dL (ref 6–20)
CHLORIDE: 102 mmol/L (ref 101–111)
CO2: 24 mmol/L (ref 22–32)
Calcium: 8 mg/dL — ABNORMAL LOW (ref 8.9–10.3)
Creatinine, Ser: 0.62 mg/dL (ref 0.44–1.00)
GFR calc Af Amer: >60 mL/min (ref >60)
Glucose, Bld: 125 mg/dL — ABNORMAL HIGH (ref 65–99)
POTASSIUM: 3.1 mmol/L — AB (ref 3.5–5.1)
Sodium: 136 mmol/L (ref 135–145)
TOTAL PROTEIN: 5.6 g/dL — AB (ref 6.5–8.1)

## 2016-12-28 LAB — PROTIME-INR
INR: 1.15
PROTHROMBIN TIME: 14.7 s (ref 11.4–15.2)

## 2016-12-28 LAB — MAGNESIUM: MAGNESIUM: 1.8 mg/dL (ref 1.7–2.4)

## 2016-12-28 LAB — APTT: aPTT: 29 seconds (ref 24–36)

## 2016-12-28 LAB — TSH: TSH: 0.513 u[IU]/mL (ref 0.350–4.500)

## 2016-12-28 LAB — VITAMIN B12: VITAMIN B 12: 843 pg/mL (ref 180–914)

## 2016-12-28 MED ORDER — POTASSIUM CHLORIDE 20 MEQ/15ML (10%) PO SOLN: 40.0000 meq | Freq: Two times a day (BID) | ORAL | Status: AC

## 2016-12-28 MED ORDER — ORAL CARE MOUTH RINSE
15.0000 mL | Freq: Two times a day (BID) | OROMUCOSAL | Status: DC
  Administered 2016-12-30 – 2017-01-01 (×4): 15 mL via OROMUCOSAL

## 2016-12-28 MED ORDER — MAGNESIUM SULFATE 2 GM/50ML IV SOLN
2.0000 g | Freq: Once | INTRAVENOUS | Status: AC
Start: 2016-12-28 — End: 2016-12-28
  Administered 2016-12-28: 2 g via INTRAVENOUS
  Filled 2016-12-28: qty 50

## 2016-12-28 MED ORDER — CHLORHEXIDINE GLUCONATE 0.12 % MT SOLN
15.0000 mL | Freq: Two times a day (BID) | OROMUCOSAL | Status: DC
  Administered 2016-12-28 – 2017-01-05 (×14): 15 mL via OROMUCOSAL
  Filled 2016-12-28 (×17): qty 15

## 2016-12-28 MED ORDER — ACETAMINOPHEN 650 MG RE SUPP
650.0000 mg | Freq: Four times a day (QID) | RECTAL | Status: DC | PRN
  Administered 2016-12-28 – 2016-12-30 (×2): 650 mg via RECTAL
  Filled 2016-12-28 (×3): qty 1

## 2016-12-28 MED ORDER — POTASSIUM CHLORIDE 10 MEQ/100ML IV SOLN
10.0000 meq | INTRAVENOUS | Status: AC
  Administered 2016-12-28 – 2016-12-29 (×6): 10 meq via INTRAVENOUS
  Filled 2016-12-28 (×6): qty 100

## 2016-12-28 NOTE — Progress Notes (Signed)
Pt transported on vent to MRI and returned to 4N21.  Pt's vitals remained stable throughout.

## 2016-12-28 NOTE — Progress Notes (Signed)
PULMONARY / CRITICAL CARE MEDICINE   Name: Lisa Mclaughlin MRN: [id number] DOB: 1950/03/25    ADMISSION DATE:  12/26/2016   HISTORY OF PRESENT ILLNESS:   Patient is 66 year old Caucasian female with past medical history of severe dementia with depression also history of agitation benzodiazepine dependent has been sleep for the past 2 days get down on the benzo happened 12/25 when she was about to go to the shower patient had witnessed seizure by her husband no evidence of head trauma no witnessed head trauma patient continued to seize.  In ER not responsive, hypotensive or hemodynamically unstable had a long discussion with the husband and the son who both made the decision about intubating her for short time to be able to do the CAT scan and further workup and restart her benzos the benzos has been decreased from 0.5 mg 5 times a day.   Subjective/Overnight:  No events overnight, just arrived back from MRI  VITAL SIGNS: BP (!) 158/83   Pulse 81   Temp 99 F (37.2 C) (Oral)   Resp 20   Ht 5\' 3"  (1.6 m)   Wt 76.1 kg (167 lb 12.3 oz)   SpO2 98%   BMI 29.72 kg/m   HEMODYNAMICS:    VENTILATOR SETTINGS: Vent Mode: PRVC FiO2 (%):  [40 %] 40 % Set Rate:  [18 bmp] 18 bmp Vt Set:  [440 mL] 440 mL PEEP:  [5 cmH20] 5 cmH20 Plateau Pressure:  [14 cmH20-16 cmH20] 16 cmH20  INTAKE / OUTPUT: I/O last 3 completed shifts: In: 3240.2 [I.V.:3135.2; IV Piggyback:105] Out: 750 [Urine:750]  PHYSICAL EXAMINATION: General: Intubated, acutely ill appearing, NAD, sedate Neuro: Intubated, sedate, no seizure, moving all ext to pain HEENT: [place]/AT, PERRL, EOM-I and MMM Cardiovascular:  RRR, Nl S1/S2, -M/R/G Lungs: CTA bilaterally Abdomen:  Soft, NT, ND and +BS Musculoskeletal:  WNL , normal pulses  Skin:  No rash   LABS:  BMET Recent Labs  Lab 12/26/16 1041 12/26/16 1933 12/27/16 0903 12/28/16 0426  NA 136  --  138 136  K 3.9  --  3.4* 3.1*  CL 104  --  102 102  CO2 22  --  23 24  BUN 10   --  8 6  CREATININE 0.90 0.90 0.71 0.62  GLUCOSE 117*  --  140* 125*   Electrolytes Recent Labs  Lab 12/26/16 1041 12/27/16 0903 12/28/16 0426  CALCIUM 8.9 8.3* 8.0*  MG  --  1.7 1.8   CBC Recent Labs  Lab 12/26/16 1933 12/27/16 0245 12/28/16 0426  WBC 20.7* 18.6* 13.6*  HGB 15.2* 15.8* 14.3  HCT 44.5 45.8 42.2  PLT 294 274 231   Coag's Recent Labs  Lab 12/27/16 0245 12/28/16 0426  APTT 20* 29  INR 1.10 1.15   Sepsis Markers No results for input(s): LATICACIDVEN, PROCALCITON, O2SATVEN in the last 168 hours.  ABG Recent Labs  Lab 12/27/16 0345 12/28/16 0355  PHART 7.443 7.465*  PCO2ART 31.3* 33.1  PO2ART 103 115*   Liver Enzymes Recent Labs  Lab 12/27/16 0903 12/28/16 0426  AST 31 24  ALT 29 22  ALKPHOS 82 80  BILITOT 0.9 0.8  ALBUMIN 3.1* 2.7*   Cardiac Enzymes No results for input(s): TROPONINI, PROBNP in the last 168 hours.  Glucose Recent Labs  Lab 12/26/16 1046 12/27/16 0259  GLUCAP 125* 205*   Imaging Dg Chest Port 1 View  Result Date: 12/28/2016 CLINICAL DATA:  65 year old female with a history of respiratory failure and seizure EXAM: PORTABLE  CHEST 1 VIEW COMPARISON:  Multiple chest x-ray 12/26/2016 FINDINGS: Cardiomediastinal silhouette unchanged in size and contour. Low lung volumes with linear opacities at the lung bases. Blunting of the left costophrenic angle. Double density overlies the lower mediastinum. No pneumothorax.  No new confluent airspace disease. Endotracheal tube is unchanged in position terminating approximately 1.6 cm above the carina. IMPRESSION: Low lung volumes persist with basilar atelectasis/ consolidation. Possible left-sided pleural effusion. Unchanged endotracheal tube terminating 1.6 cm above the carina. Hiatal hernia Electronically Signed   By: Corrie Mckusick D.O.   On: 12/28/2016 07:52   STUDIES:  CT head 12/25>> neg acute  EEG 12/26>>> This is an abnormal EEG due to the presence of the generalized  polymorphic delta slowing, suggesting severe encephalopathy, but medication effect (such as propofol, benzodiazepine) could not be excluded.  No epileptiform discharges or seizures were in evidence.  MRI brian 12/27>>>   DISCUSSION: Patient 66 years old who presented with altered status changes possibility of procedure was given Keppra and fosphenytoin and Ativan patient became altered had to be intubated airway had to be revised twice given that the patient had a balloon that is down the first ET tube with extremely difficult intubation family wants to try to keep her intubated only for short period of time if needed and after that they want to consider comfort care if she does not get better extubated EEG showed no seizure activity she is on Keppra and she is on Versed and propofol at this point she is waiting for an MRI.  We will try to extubate her immediately after that if she is appropriate and cooperative.  ASSESSMENT / PLAN:  PULMONARY Acute respiratory failure - r/t AMS in setting seizure and probable aspiration  PLAN -  Vent support - 8cc/kg  F/u CXR  F/u ABG SBT when returns from MRI  Will start abx for possible aspiration - see ID    CARDIOVASCULAR Hx HTN  PLAN -  Continue to hold home coreg, imdur for now   RENAL Hypokalemia  PLAN -  Replete K PRN  F/u chem   GASTROINTESTINAL large hiatal hernia PLAN -  Add TF if not extubated 12/27 PPI   HEMATOLOGIC No active issues PLAN -  lovenox for DVT prophylaxis  F/u CBC   INFECTIOUS ?aspiration  PLAN -  Will add abx for ?aspiration given low grade fever, leukocytosis and increased atx v consolidation on CXR  Check pct  Narrow quickly  Sputum culture    ENDOCRINE No active issues  NEUROLOGIC Seizure with mental status change combination of advanced dementia neuropsych disorder [name] medication side effects and benzos withdrawal PLAN -  Continue benzos to avoid further withdrawal Neuro  following Await MRI  keppra per neuro  SBT after MRI  Propofol for sedation     FAMILY  - Updates: family updated bedside  Nickolas Madrid, NP 12/28/2016  8:49 AM Pager: [phone]) [phone] or [phone]  66 year old female s/p seizure due to overly enthusiastic use of benzos and attempt to wean self off.  On exam, she is arousable with clear lungs.  I reviewed CXR myself, ETT is ok.  Discussed with PCCM-NP.  Will take off sedation now.  Place on 5/5.  Await MRI results, if nothing dramatic to preclude extubation then will proceed with extubation today.  Appreciate input for neurology.  Will continue current anti-epileptic medications.  PCCM will follow.  The patient is critically ill with multiple organ systems failure and requires high complexity  decision making for assessment and support, frequent evaluation and titration of therapies, application of advanced monitoring technologies and extensive interpretation of multiple databases.   Critical Care Time devoted to patient care services described in this note is  35  Minutes. This time reflects time of care of this signee Dr Jennet Maduro. This critical care time does not reflect procedure time, or teaching time or supervisory time of PA/NP/Med student/Med Resident etc but could involve care discussion time.  Rush Farmer, M.D. [facility] Pulmonary/Critical Care Medicine. Pager: [phone]. After hours pager: [phone].

## 2016-12-28 NOTE — Procedures (Signed)
Extubation Procedure Note  Patient Details:   Name: Lisa Mclaughlin DOB: 07/24/1950 MRN: [id number]   Airway Documentation:     Evaluation  O2 sats: stable throughout Complications: No apparent complications Patient did tolerate procedure well. Bilateral Breath Sounds: Clear, Diminished   Yes   Positive cuff leak noted.  Pt placed on [place] 4 L with humidity, no stridor noted. Family and RN at bedside.   Mingo Amber Jossalyn Forgione 12/28/2016, 2:54 PM

## 2016-12-28 NOTE — Progress Notes (Addendum)
Assessed patient at bedside for extubation.  RN reports versed has been off for approximately one hour, propofol off approximately 5 minutes.  Patient assessed at bedside.  She is currently tolerating PSV wean with good RSBI but is not alert enough to support extubation at this time.  She was reportedly a difficult airway on initial intubation.  Will hold extubation until patient is more alert / following commands.  Family updated on plan of care.  Will reassess.    Noe Gens, NP-C [facility] Pgr: [phone] or if no answer [phone] 12/28/2016, 11:19 AM

## 2016-12-28 NOTE — Progress Notes (Addendum)
Neurology Progress Note   S:// Seen and examined. MRI shows no acute intracranial abnormality but the atrophy in general and hippocampal atrophy have worsened from the MRI compared to 2014. No clinical seizure activity noted. Currently on propofol at 15/h Long-term EEG not suggestive of seizures.  O:// Current vital signs: BP (!) 148/62   Pulse 87   Temp 99.2 F (37.3 C) (Axillary)   Resp (!) 23   Ht '5\' 3"'$  (1.6 m)   Wt 76.1 kg (167 lb 12.3 oz)   SpO2 97%   BMI 29.72 kg/m   Vital signs in last 24 hours: Temp:  [98.2 F (36.8 C)-99.4 F (37.4 C)] 99.2 F (37.3 C) (12/27 0950) Pulse Rate:  [65-89] 87 (12/27 1200) Resp:  [19-29] 23 (12/27 1200) BP: (112-158)/(58-96) 148/62 (12/27 1200) SpO2:  [97 %-100 %] 97 % (12/27 1200) FiO2 (%):  [40 %] 40 % (12/27 1115) Weight:  [76.1 kg (167 lb 12.3 oz)] 76.1 kg (167 lb 12.3 oz) (12/27 0500) General: Well-developed well-nourished no acute distress H ENT: Normocephalic atraumatic EEG leads in place CVS: S1-S2 heard Chest clear to auscultation Abdomen nondistended nontender Neurological exam Patient is sedated and intubated on propofol. Spontaneously moving both sides. Cranial nerves: Pupils equal round reactive to light, oculocephalics present, no blink to threat, facial symmetry difficult to ascertain with the tube. Motor exam: Withdraws symmetrically to noxious stim in all 4. Sensory exam as above  Medications  Current Facility-Administered Medications:  .  0.9 %  sodium chloride infusion, 250 mL, Intravenous, PRN, Etheleen Nicks, MD .  0.9 %  sodium chloride infusion, , Intravenous, Continuous, Deterding, Guadelupe Sabin, MD, Last Rate: 75 mL/hr at 12/28/16 1100 .  chlordiazePOXIDE (LIBRIUM) capsule 25 mg, 25 mg, Oral, Q6H PRN, Etheleen Nicks, MD .  chlordiazePOXIDE (LIBRIUM) capsule 25 mg, 25 mg, Oral, TID, Etheleen Nicks, MD, Stopped at 12/27/16 [phone] .  chlorhexidine gluconate (MEDLINE KIT) (PERIDEX) 0.12 % solution 15 mL, 15  mL, Mouth Rinse, BID, Etheleen Nicks, MD, 15 mL at 12/28/16 0755 .  enoxaparin (LOVENOX) injection 40 mg, 40 mg, Subcutaneous, Q24H, Etheleen Nicks, MD, 40 mg at 12/27/16 1727 .  hydrOXYzine (ATARAX/VISTARIL) tablet 25 mg, 25 mg, Oral, Q6H PRN, Etheleen Nicks, MD .  levETIRAcetam (KEPPRA) 500 mg in sodium chloride 0.9 % 100 mL IVPB, 500 mg, Intravenous, Q12H, Amie Portland, MD, Stopped at 12/28/16 [phone] .  loperamide (IMODIUM) capsule 2-4 mg, 2-4 mg, Oral, PRN, Etheleen Nicks, MD .  LORazepam (ATIVAN) injection 0-4 mg, 0-4 mg, Intravenous, Q6H, Stopped at 12/26/16 1453 **OR** LORazepam (ATIVAN) tablet 0-4 mg, 0-4 mg, Oral, Q6H, Julianne Rice, MD .  MEDLINE mouth rinse, 15 mL, Mouth Rinse, 10 times per day, Etheleen Nicks, MD, 15 mL at 12/28/16 1109 .  midazolam (VERSED) 50 mg in sodium chloride 0.9 % 50 mL (1 mg/mL) infusion, 2 mg/hr, Intravenous, Continuous, Etheleen Nicks, MD, Stopped at 12/28/16 1000 .  multivitamin with minerals tablet 1 tablet, 1 tablet, Oral, Daily, Etheleen Nicks, MD, Stopped at 12/27/16 [phone] .  ondansetron (ZOFRAN-ODT) disintegrating tablet 4 mg, 4 mg, Oral, Q6H PRN, Etheleen Nicks, MD .  pantoprazole (PROTONIX) injection 40 mg, 40 mg, Intravenous, Q24H, Etheleen Nicks, MD, 40 mg at 12/27/16 1844 .  potassium chloride 20 MEQ/15ML (10%) solution 40 mEq, 40 mEq, Per Tube, BID, Whiteheart, Cristal Ford, NP, Stopped at 12/28/16 1040 .  propofol (DIPRIVAN) 1000 MG/100ML infusion, 5-80 mcg/kg/min, Intravenous, Titrated, Etheleen Nicks,  MD, Stopped at 12/28/16 1100 .  thiamine (VITAMIN B-1) tablet 100 mg, 100 mg, Oral, Daily **OR** thiamine (B-1) injection 100 mg, 100 mg, Intravenous, Daily, Julianne Rice, MD, 100 mg at 12/28/16 1059 Labs CBC    Component Value Date/Time   WBC 13.6 (H) 12/28/2016 0426   RBC 4.45 12/28/2016 0426   HGB 14.3 12/28/2016 0426   HCT 42.2 12/28/2016 0426   PLT 231 12/28/2016 0426   MCV 94.8 12/28/2016 0426   MCH 32.1 12/28/2016 0426    MCHC 33.9 12/28/2016 0426   RDW 13.4 12/28/2016 0426   LYMPHSABS 1.0 12/28/2016 0426   MONOABS 1.6 (H) 12/28/2016 0426   EOSABS 0.0 12/28/2016 0426   BASOSABS 0.0 12/28/2016 0426    CMP     Component Value Date/Time   NA 136 12/28/2016 0426   K 3.1 (L) 12/28/2016 0426   CL 102 12/28/2016 0426   CO2 24 12/28/2016 0426   GLUCOSE 125 (H) 12/28/2016 0426   BUN 6 12/28/2016 0426   CREATININE 0.62 12/28/2016 0426   CREATININE 0.82 06/18/2015 1628   CALCIUM 8.0 (L) 12/28/2016 0426   PROT 5.6 (L) 12/28/2016 0426   ALBUMIN 2.7 (L) 12/28/2016 0426   AST 24 12/28/2016 0426   ALT 22 12/28/2016 0426   ALKPHOS 80 12/28/2016 0426   BILITOT 0.8 12/28/2016 0426   GFRNONAA >60 12/28/2016 0426   GFRAA >60 12/28/2016 0426    Imaging I have reviewed images in epic and the results pertinent to this consultation are: MRI of the brain-no acute abnormality.  More pronounced hippocampal atrophy and generalized atrophy compared to 2014.  Assessment:  66 year old woman with a past medical history of advanced dementia brought into the hospital for witnessed seizure-like activity, who also had another episode in the hospital for which she was given multiple doses of Ativan.  There was some concern of ongoing seizure activity and status epilepticus and inability to protect airway, for which she was intubated. Long-term EEG has showed no evidence of electrographic abnormality other than generalized slowing. MRI brain has also not reveal any acute abnormality but has shown progressive hippocampal and generalized atrophy when compared to a scan done 4 years ago.   I believe that the episode she had at home and in the emergency room requiring Ativan for seizures but following that she had become more somnolent secondary to medications.  She is currently not in status epilepticus.  Impression: Seizures Toxic metabolic encephalopathy-likely secondary to polypharmacy  Recommendations: Continue Keppra 500  twice daily Check B12 and TSH Extubation per [facility] M. Refrain from using [name] as an outpatient. Outpatient dementia clinic follow-up.  This morning, I met with the family, updated them on the imaging and EEG findings, and the plan going forward.  I answered all their questions regarding her care in the hospital from a neurological standpoint.  I also answered their questions regarding patient placement options at discharge.  I would have social work assist with placement options along with case management.  We will follow with you.  -- Amie Portland, MD [facility] [phone] If 7pm to 7am, please call on call as listed on AMION.

## 2016-12-29 ENCOUNTER — Telehealth: Payer: Self-pay | Admitting: Family Medicine

## 2016-12-29 LAB — BASIC METABOLIC PANEL
ANION GAP: 12 (ref 5–15)
BUN: <5 mg/dL — ABNORMAL LOW (ref 6–20)
CHLORIDE: 103 mmol/L (ref 101–111)
CO2: 20 mmol/L — AB (ref 22–32)
CREATININE: 0.61 mg/dL (ref 0.44–1.00)
Calcium: 8.5 mg/dL — ABNORMAL LOW (ref 8.9–10.3)
GFR calc non Af Amer: >60 mL/min (ref >60)
Glucose, Bld: 116 mg/dL — ABNORMAL HIGH (ref 65–99)
POTASSIUM: 4.1 mmol/L (ref 3.5–5.1)
SODIUM: 135 mmol/L (ref 135–145)

## 2016-12-29 LAB — TRIGLYCERIDES: Triglycerides: 119 mg/dL (ref <150)

## 2016-12-29 LAB — MAGNESIUM: Magnesium: 2.1 mg/dL (ref 1.7–2.4)

## 2016-12-29 LAB — CBC
HEMATOCRIT: 43 % (ref 36.0–46.0)
HEMOGLOBIN: 14.5 g/dL (ref 12.0–15.0)
MCH: 32.2 pg (ref 26.0–34.0)
MCHC: 33.7 g/dL (ref 30.0–36.0)
MCV: 95.3 fL (ref 78.0–100.0)
Platelets: 247 10*3/uL (ref 150–400)
RBC: 4.51 MIL/uL (ref 3.87–5.11)
RDW: 13.5 % (ref 11.5–15.5)
WBC: 13.8 10*3/uL — AB (ref 4.0–10.5)

## 2016-12-29 LAB — PHOSPHORUS: Phosphorus: 1.5 mg/dL — ABNORMAL LOW (ref 2.5–4.6)

## 2016-12-29 MED ORDER — CLONIDINE HCL 0.1 MG PO TABS: 0.1000 mg | ORAL_TABLET | Freq: Two times a day (BID) | ORAL | Status: DC | PRN

## 2016-12-29 MED ORDER — LORAZEPAM 0.5 MG PO TABS
0.5000 mg | ORAL_TABLET | Freq: Four times a day (QID) | ORAL | Status: DC
  Administered 2016-12-29 – 2017-01-05 (×23): 0.5 mg via ORAL
  Filled 2016-12-29 (×24): qty 1

## 2016-12-29 MED ORDER — MEMANTINE HCL 10 MG PO TABS
10.0000 mg | ORAL_TABLET | Freq: Every day | ORAL | Status: DC
  Administered 2016-12-30 – 2017-01-05 (×6): 10 mg via ORAL
  Filled 2016-12-29 (×8): qty 1

## 2016-12-29 MED ORDER — LORAZEPAM 2 MG/ML IJ SOLN: 0.5000 mg | Freq: Four times a day (QID) | INTRAMUSCULAR | Status: DC

## 2016-12-29 MED ORDER — ISOSORBIDE MONONITRATE ER 30 MG PO TB24
30.0000 mg | ORAL_TABLET | Freq: Every day | ORAL | Status: DC
  Administered 2016-12-29 – 2017-01-05 (×7): 30 mg via ORAL
  Filled 2016-12-29 (×10): qty 1

## 2016-12-29 MED ORDER — CARVEDILOL 3.125 MG PO TABS
3.1250 mg | ORAL_TABLET | Freq: Two times a day (BID) | ORAL | Status: DC
  Administered 2016-12-29 – 2017-01-04 (×12): 3.125 mg via ORAL
  Filled 2016-12-29 (×15): qty 1

## 2016-12-29 MED ORDER — DONEPEZIL HCL 10 MG PO TABS
10.0000 mg | ORAL_TABLET | Freq: Every day | ORAL | Status: DC
  Administered 2016-12-29 – 2017-01-04 (×7): 10 mg via ORAL
  Filled 2016-12-29 (×7): qty 1

## 2016-12-29 MED ORDER — LEVETIRACETAM 500 MG PO TABS
500.0000 mg | ORAL_TABLET | Freq: Two times a day (BID) | ORAL | Status: DC
  Administered 2016-12-29 – 2017-01-05 (×13): 500 mg via ORAL
  Filled 2016-12-29 (×15): qty 1

## 2016-12-29 NOTE — Telephone Encounter (Signed)
Copied from [place]. Topic: Quick Communication - See Telephone Encounter >> Dec 29, 2016  8:24 AM Ether Griffins B wrote: CRM for notification. See Telephone encounter for:  Pts husband calling in wanting Dr. Damita Dunnings to know he received his call and VM and would like Dr. Damita Dunnings to call him back when he can today. He will be at the hospital with Lisa Mclaughlin. If he doesn't answer the call a doctor is in the room. Please call [phone].  12/29/16.

## 2016-12-29 NOTE — Progress Notes (Signed)
Occupational Therapy Note:  OT eval completed, full write up to follow.  Pt may benefit from [facility] depending on pt progress.  Pt required +2 min A for functional mobility, and max A for self feeding.   Lucille Passy, OTR/L [phone]

## 2016-12-29 NOTE — Progress Notes (Signed)
PULMONARY / CRITICAL CARE MEDICINE   Name: Lisa Mclaughlin MRN: [id number] DOB: [birth year]    ADMISSION DATE:  12/26/2016   HISTORY OF PRESENT ILLNESS:   Patient is 66 year old Caucasian female with past medical history of severe dementia with depression also history of agitation benzodiazepine dependent has been sleep for the past 2 days get down on the benzo happened 12/25 when she was about to go to the shower patient had witnessed seizure by her husband no evidence of head trauma no witnessed head trauma patient continued to seize.  In ER not responsive, hypotensive or hemodynamically unstable had a long discussion with the husband and the son who both made the decision about intubating her for short time to be able to do the CAT scan and further workup and restart her benzos the benzos has been decreased from 0.5 mg 5 times a day.   Subjective/Overnight:  No events overnight, just arrived back from MRI  VITAL SIGNS: BP (!) 166/100   Pulse 83   Temp 98.3 F (36.8 C) (Oral)   Resp (!) 22   Ht 5\' 3"  (1.6 m)   Wt 157 lb 13.6 oz (71.6 kg)   SpO2 97%   BMI 27.96 kg/m   HEMODYNAMICS:    VENTILATOR SETTINGS: Vent Mode: PSV;CPAP FiO2 (%):  [40 %] 40 % PEEP:  [5 cmH20] 5 cmH20 Pressure Support:  [5 cmH20] 5 cmH20  INTAKE / OUTPUT: I/O last 3 completed shifts: In: 3880.9 [I.V.:2915.9; IV Piggyback:965] Out: 1100 [Urine:1100]  PHYSICAL EXAMINATION: General: Extubated December 28, 2016 following commands sitting up in chair talking to her husband limited memory Neuro: Extubated no visible seizure activity oriented to her husband answering questions appropriately HEENT: [place]/AT, PERRL, EOM-I and MMM Cardiovascular:  RRR, Nl S1/S2, -M/R/G Lungs: CTA bilaterally Abdomen:  Soft, NT, ND and +BS Musculoskeletal:  WNL , normal pulses  Skin:  No rash   LABS:  BMET Recent Labs  Lab 12/27/16 0903 12/28/16 0426 12/29/16 0426  NA 138 136 135  K 3.4* 3.1* 4.1  CL 102 102 103  CO2 23 24  20*  BUN 8 6 <5*  CREATININE 0.71 0.62 0.61  GLUCOSE 140* 125* 116*   Electrolytes Recent Labs  Lab 12/27/16 0903 12/28/16 0426 12/29/16 0426  CALCIUM 8.3* 8.0* 8.5*  MG 1.7 1.8 2.1  PHOS  --   --  1.5*   CBC Recent Labs  Lab 12/27/16 0245 12/28/16 0426 12/29/16 0426  WBC 18.6* 13.6* 13.8*  HGB 15.8* 14.3 14.5  HCT 45.8 42.2 43.0  PLT 274 231 247   Coag's Recent Labs  Lab 12/27/16 0245 12/28/16 0426  APTT 20* 29  INR 1.10 1.15   Sepsis Markers No results for input(s): LATICACIDVEN, PROCALCITON, O2SATVEN in the last 168 hours.  ABG Recent Labs  Lab 12/27/16 0345 12/28/16 0355  PHART 7.443 7.465*  PCO2ART 31.3* 33.1  PO2ART 103 115*   Liver Enzymes Recent Labs  Lab 12/27/16 0903 12/28/16 0426  AST 31 24  ALT 29 22  ALKPHOS 82 80  BILITOT 0.9 0.8  ALBUMIN 3.1* 2.7*   Cardiac Enzymes No results for input(s): TROPONINI, PROBNP in the last 168 hours.  Glucose Recent Labs  Lab 12/26/16 1046 12/27/16 0259  GLUCAP 125* 205*   Imaging No results found. STUDIES:  CT head 12/25>> neg acute  EEG 12/26>>> This is an abnormal EEG due to the presence of the generalized polymorphic delta slowing, suggesting severe encephalopathy, but medication effect (such as propofol, benzodiazepine) could  not be excluded.  No epileptiform discharges or seizures were in evidence.  MRI brian 12/27>>>   DISCUSSION: Patient 66 years old who presented with altered status changes possibility of procedure was given Keppra and fosphenytoin and Ativan patient became altered had to be intubated airway had to be revised twice given that the patient had a balloon that is down the first ET tube with extremely difficult intubation family wants to try to keep her intubated only for short period of time if needed and after that they want to consider comfort care if she does not get better extubated EEG showed no seizure activity she is on Keppra and she is on Versed and propofol at this  point she is waiting for an MRI.  We will try to extubate her immediately after that if she is appropriate and cooperative.  ASSESSMENT / PLAN:  PULMONARY Acute respiratory failure - r/t AMS in setting seizure and probable aspiration  PLAN -  Patient extubated December 66, 2018 no evidence of aspiration pneumonia will hold off on antibiotics bedside eval for speech by the nurse patient is able to eat we will start feeding her.   CARDIOVASCULAR Hx HTN  PLAN -  Continue to hold home coreg, imdur for now can be resumed before discharge  RENAL Hypokalemia  PLAN -  Replete K PRN  F/u chem   GASTROINTESTINAL large hiatal hernia PLAN -  Patient is able to tolerate p.o.  HEMATOLOGIC No active issues PLAN -  lovenox for DVT prophylaxis  F/u CBC   INFECTIOUS No evidence of aspiration we will hold off antibiotic     ENDOCRINE No active issues  NEUROLOGIC Seizure with mental status change combination of advanced dementia neuropsych disorder [name] medication side effects and benzos withdrawal PLAN -  Continue benzos to avoid further withdrawal Ativan 0.5 mg every 6 hours scheduled DC Librium Neuro following MRI results with evidence of Alzheimer findings keppra per neuro change to p.o. 500 twice daily   FAMILY  - Updates: family updated bedside   Patient disposal for right now she will go to regular bed in the medical floor sign out to the [facility] no need for critical care or pulmonary to follow-up.  [place]. Pager: [phone].

## 2016-12-29 NOTE — Telephone Encounter (Signed)
Copied from [place] [id number]. Topic: General - Other >> Dec 29, 2016  5:14 PM Cecelia Byars, NT wrote: Reason for CRM: Patients husband  found a note from Dr  Rory Percy stating that we should wean off of welbutrin slowly will talk soon ,thanks we have an appointment with Dr Posey Pronto on 01/17/17 at [place]

## 2016-12-29 NOTE — Progress Notes (Addendum)
Neurology Progress Note   S:// Extubated Following commands. No further seizure activity noted.   O:// Current vital signs: BP (!) 166/100   Pulse 83   Temp 98.3 F (36.8 C) (Oral)   Resp (!) 22   Ht 5\' 3"  (1.6 m)   Wt 71.6 kg (157 lb 13.6 oz)   SpO2 97%   BMI 27.96 kg/m  Vital signs in last 24 hours: Temp:  [98.3 F (36.8 C)-100.9 F (38.3 C)] 98.3 F (36.8 C) (12/28 0800) Pulse Rate:  [81-104] 83 (12/28 0900) Resp:  [16-27] 22 (12/28 0900) BP: (112-179)/(62-115) 166/100 (12/28 0900) SpO2:  [93 %-99 %] 97 % (12/28 0900) FiO2 (%):  [40 %] 40 % (12/27 1115) Weight:  [71.6 kg (157 lb 13.6 oz)] 71.6 kg (157 lb 13.6 oz) (12/28 0500) General: Patient is awake, alert, no acute distress HEENT: Normocephalic, atraumatic, dry mucous membranes. Subtle tremors of lips. Hypomimia+ Lungs clear to auscultation Cardiovascular: S1-S2 heard, regular rate rhythm Abdomen soft nondistended nontender Neurological exam Mental status: Awake alert oriented to self.  Poor attention concentration. Speech is mildly dysarthric and slow.  Follows commands Cranial nerves: Pupils equal round reactive to light, extra movements intact, visual fields full, facial symmetry present, shoulder shrug intact, tongue midline Motor exam: Antigravity without drift in all 4 extremities. Sensory exam: Intact light touch all over Coordination: Slow but intact finger nose finger Gait exam was deferred.  Medications  Current Facility-Administered Medications:  .  0.9 %  sodium chloride infusion, 250 mL, Intravenous, PRN, Etheleen Nicks, MD .  0.9 %  sodium chloride infusion, , Intravenous, Continuous, Deterding, Guadelupe Sabin, MD, Last Rate: 75 mL/hr at 12/29/16 0846 .  acetaminophen (TYLENOL) suppository 650 mg, 650 mg, Rectal, Q6H PRN, Greta Doom, MD, 650 mg at 12/28/16 2147 .  chlordiazePOXIDE (LIBRIUM) capsule 25 mg, 25 mg, Oral, Q6H PRN, Etheleen Nicks, MD .  chlordiazePOXIDE (LIBRIUM) capsule  25 mg, 25 mg, Oral, TID, Etheleen Nicks, MD, Stopped at 12/27/16 [phone] .  chlorhexidine (PERIDEX) 0.12 % solution 15 mL, 15 mL, Mouth Rinse, BID, Amie Portland, MD, 15 mL at 12/28/16 1604 .  enoxaparin (LOVENOX) injection 40 mg, 40 mg, Subcutaneous, Q24H, Etheleen Nicks, MD, 40 mg at 12/28/16 1844 .  hydrOXYzine (ATARAX/VISTARIL) tablet 25 mg, 25 mg, Oral, Q6H PRN, Etheleen Nicks, MD .  levETIRAcetam (KEPPRA) 500 mg in sodium chloride 0.9 % 100 mL IVPB, 500 mg, Intravenous, Q12H, Amie Portland, MD, Stopped at 12/29/16 [phone]  loperamide (IMODIUM) capsule 2-4 mg, 2-4 mg, Oral, PRN, Etheleen Nicks, MD .  MEDLINE mouth rinse, 15 mL, Mouth Rinse, q12n4p, Amie Portland, MD .  midazolam (VERSED) 50 mg in sodium chloride 0.9 % 50 mL (1 mg/mL) infusion, 2 mg/hr, Intravenous, Continuous, Etheleen Nicks, MD, Stopped at 12/28/16 1000 .  multivitamin with minerals tablet 1 tablet, 1 tablet, Oral, Daily, Etheleen Nicks, MD, Stopped at 12/27/16 [phone]  ondansetron (ZOFRAN-ODT) disintegrating tablet 4 mg, 4 mg, Oral, Q6H PRN, Etheleen Nicks, MD .  pantoprazole (PROTONIX) injection 40 mg, 40 mg, Intravenous, Q24H, Etheleen Nicks, MD, 40 mg at 12/28/16 1843 .  potassium chloride 20 MEQ/15ML (10%) solution 40 mEq, 40 mEq, Per Tube, BID, Whiteheart, Cristal Ford, NP, Stopped at 12/28/16 1040 .  propofol (DIPRIVAN) 1000 MG/100ML infusion, 5-80 mcg/kg/min, Intravenous, Titrated, Etheleen Nicks, MD, Stopped at 12/28/16 1100 .  thiamine (VITAMIN B-1) tablet 100 mg, 100 mg, Oral, Daily **OR** thiamine (B-1) injection 100  mg, 100 mg, Intravenous, Daily, Julianne Rice, MD, 100 mg at 12/28/16 1059 Labs CBC    Component Value Date/Time   WBC 13.8 (H) 12/29/2016 0426   RBC 4.51 12/29/2016 0426   HGB 14.5 12/29/2016 0426   HCT 43.0 12/29/2016 0426   PLT 247 12/29/2016 0426   MCV 95.3 12/29/2016 0426   MCH 32.2 12/29/2016 0426   MCHC 33.7 12/29/2016 0426   RDW 13.5 12/29/2016 0426   LYMPHSABS 1.0 12/28/2016 0426    MONOABS 1.6 (H) 12/28/2016 0426   EOSABS 0.0 12/28/2016 0426   BASOSABS 0.0 12/28/2016 0426    CMP     Component Value Date/Time   NA 135 12/29/2016 0426   K 4.1 12/29/2016 0426   CL 103 12/29/2016 0426   CO2 20 (L) 12/29/2016 0426   GLUCOSE 116 (H) 12/29/2016 0426   BUN <5 (L) 12/29/2016 0426   CREATININE 0.61 12/29/2016 0426   CREATININE 0.82 06/18/2015 1628   CALCIUM 8.5 (L) 12/29/2016 0426   PROT 5.6 (L) 12/28/2016 0426   ALBUMIN 2.7 (L) 12/28/2016 0426   AST 24 12/28/2016 0426   ALT 22 12/28/2016 0426   ALKPHOS 80 12/28/2016 0426   BILITOT 0.8 12/28/2016 0426   GFRNONAA >60 12/29/2016 0426   GFRAA >60 12/29/2016 0426     Imaging I have reviewed images in epic and the results pertinent to this consultation are: MRI brain shows no acute finding but shows worsening of generalized atrophy including hippocampal atrophy compared to scan from 5 years ago.  Assessment:  66 year old woman with a past history of advanced dementia brought to the hospital for witnessed seizure-like activity.  She had another episode in the hospital.  Given multiple doses of Ativan and had to be intubated for concern of status epilepticus. No seizures noted on the routine EEG or LTM EEG. She was successfully extubated yesterday. She started on Keppra. Continues to have leukocytosis probably secondary to aspiration pneumonia.  Impression: -Seizures in the setting of dementia and possible benzodiazepine withdrawal-doses were changed recently with smaller doses administered when family members around versus larger doses administered with caregivers. 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 To avoid benzo has been withdrawal, I would recommend starting her on 0.5 mg Ativan every 6 hours. Management of aspiration pneumonia per [name] M as you are. AVOID [name] - MIGHT NEED PSYCHIATRY INPUT AS OUTPATIENT FOR ALTERNATIVE ANTIDEPRESSANTS  -- Amie Portland, MD [facility] [phone] If 7pm to 7am, please call on call as listed on AMION.

## 2016-12-29 NOTE — Evaluation (Signed)
Physical Therapy Evaluation Patient Details Name: Lisa Mclaughlin MRN: [id number] DOB: [birth year] Today's Date: 12/29/2016   History of Present Illness  Patient is 66 year old Caucasian female with past medical history of severe dementia with depression also history of agitation benzodiazepine dependent has been sleep for the past 2 days get down on the benzo happened 12/25 when she was about to go to the shower patient had witnessed seizure by her husband no evidence of head trauma no witnessed head trauma patient continued to seize.    Clinical Impression  Pt with delayed processing and impaired initiation most likely due to the severe dementia (stage 6 Alzheimers). Despite recent seizures and prolonged hospital stay pt is functioning at a minA level and was able to amb. Pt did experience SOB with amb which spouse reports to be normal and her limited factor to mobility at home. Pt SpO2 dec to 82% on RA, returned to 98% on 4 LO2. Patient with good home set up and 24/7 assist for pt to safely return home once medically stable.    Follow Up Recommendations No PT follow up;Supervision/Assistance - 24 hour    Equipment Recommendations  (handicapped apt on 1st floor)    Recommendations for Other Services       Precautions / Restrictions Precautions Precautions: Fall Precaution Comments: severe dementia Restrictions Weight Bearing Restrictions: No      Mobility  Bed Mobility Overal bed mobility: Needs Assistance Bed Mobility: Supine to Sit     Supine to sit: Max assist     General bed mobility comments: maxA required due to pt inbility to follow/process commands.  Transfers Overall transfer level: Needs assistance Equipment used: 1 person hand held assist Transfers: Sit to/from Stand Sit to Stand: Min assist         General transfer comment: pt initiated standing when asked to transfer to the chair, slow and unsteady  Ambulation/Gait Ambulation/Gait assistance: Min assist;+2  safety/equipment(2nd person to push IV pole) Ambulation Distance (Feet): 60 Feet Assistive device: 1 person hand held assist Gait Pattern/deviations: Step-to pattern;Decreased stride length;Shuffle Gait velocity: slow Gait velocity interpretation: Below normal speed for age/gender General Gait Details: pt with mild SOB, SpO2 dec to 82% on RA. pt able to amb with minA tactile cues to con't with forward progression  Stairs            Wheelchair Mobility    Modified Rankin (Stroke Patients Only)       Balance Overall balance assessment: Needs assistance Sitting-balance support: Feet supported;No upper extremity supported Sitting balance-Leahy Scale: Fair     Standing balance support: Single extremity supported Standing balance-Leahy Scale: Poor                               Pertinent Vitals/Pain Pain Assessment: No/denies pain    Home Living Family/patient expects to be discharged to:: Private residence Living Arrangements: Spouse/significant other(has hired caregivers as well) Available Help at Discharge: Family;Available 24 hours/day Type of Home: Apartment Home Access: Stairs to enter Entrance Stairs-Rails: Right Entrance Stairs-Number of Steps: flight Home Layout: One level Home Equipment: (grab bar) Additional Comments: pt's spouse aware and is in process of looking for a handicapped accessible apt on the first floor    Prior Function Level of Independence: Needs assistance   Gait / Transfers Assistance Needed: amb in apt without AD, tolerance limited by SOB and cardiopulmonary condition  ADL's / Homemaking Assistance Needed: caregivers to assist with bathing,  getting pt dressed, cooking, pt able to feed self with prompting        Hand Dominance        Extremity/Trunk Assessment   Upper Extremity Assessment Upper Extremity Assessment: Generalized weakness;Difficult to assess due to impaired cognition    Lower Extremity Assessment Lower  Extremity Assessment: Generalized weakness;Difficult to assess due to impaired cognition    Cervical / Trunk Assessment Cervical / Trunk Assessment: Kyphotic  Communication   Communication: (soft spoken, delayed response)  Cognition Arousal/Alertness: Awake/alert Behavior During Therapy: Flat affect Overall Cognitive Status: History of cognitive impairments - at baseline                                 General Comments: stage 6 alzheimers      General Comments General comments (skin integrity, edema, etc.): spoke extensively with spouse regarding finding a handicapped accessible apartment. spouse had questions concerning and adult day program as well.    Exercises     Assessment/Plan    PT Assessment Patient needs continued PT services  PT Problem List Decreased strength;Decreased range of motion;Decreased activity tolerance;Decreased balance;Decreased mobility;Decreased coordination;Decreased cognition;Decreased knowledge of use of DME;Decreased safety awareness       PT Treatment Interventions DME instruction;Gait training;Stair training;Functional mobility training;Therapeutic activities;Therapeutic exercise;Balance training;Neuromuscular re-education    PT Goals (Current goals can be found in the Care Plan section)  Acute Rehab PT Goals Patient Stated Goal: didn't state PT Goal Formulation: With patient/family Time For Goal Achievement: 01/12/17 Potential to Achieve Goals: Good    Frequency Min 3X/week   Barriers to discharge        Co-evaluation               AM-PAC PT "6 Clicks" Daily Activity  Outcome Measure Difficulty turning over in bed (including adjusting bedclothes, sheets and blankets)?: Unable Difficulty moving from lying on back to sitting on the side of the bed? : Unable Difficulty sitting down on and standing up from a chair with arms (e.g., wheelchair, bedside commode, etc,.)?: Unable Help needed moving to and from a bed to  chair (including a wheelchair)?: A Lot Help needed walking in hospital room?: A Lot Help needed climbing 3-5 steps with a railing? : A Lot 6 Click Score: 9    End of Session Equipment Utilized During Treatment: Gait belt Activity Tolerance: Patient tolerated treatment well Patient left: in chair;with call bell/phone within reach;with chair alarm set;with family/visitor present Nurse Communication: Mobility status PT Visit Diagnosis: Difficulty in walking, not elsewhere classified (R26.2);Muscle weakness (generalized) (M62.81)    Time: 2426-8341 PT Time Calculation (min) (ACUTE ONLY): 32 min   Charges:   PT Evaluation $PT Eval Moderate Complexity: 1 Mod PT Treatments $Gait Training: 8-22 mins   PT G Codes:        Kittie Plater, PT, DPT Pager #: [phone] Office #: [phone]   Kindel Rochefort M Tayvon Culley 12/29/2016, 11:44 AM

## 2016-12-30 LAB — CBC WITH DIFFERENTIAL/PLATELET
Basophils Relative: 0 %
EOS PCT: 0 %
HCT: 41.8 % (ref 36.0–46.0)
HEMOGLOBIN: 14.3 g/dL (ref 12.0–15.0)
LYMPHS PCT: 13 %
MCH: 32.1 pg (ref 26.0–34.0)
MCHC: 34.2 g/dL (ref 30.0–36.0)
MCV: 93.9 fL (ref 78.0–100.0)
MONOS PCT: 9 %
NEUTROS PCT: 78 %
PLATELETS: 282 10*3/uL (ref 150–400)
RBC: 4.45 MIL/uL (ref 3.87–5.11)
RDW: 13.1 % (ref 11.5–15.5)
WBC: 10.7 10*3/uL — AB (ref 4.0–10.5)

## 2016-12-30 LAB — CULTURE, RESPIRATORY: GRAM STAIN: NONE SEEN

## 2016-12-30 LAB — COMPREHENSIVE METABOLIC PANEL
ALK PHOS: 80 U/L (ref 38–126)
ALT: 29 U/L (ref 14–54)
ANION GAP: 9 (ref 5–15)
AST: 23 U/L (ref 15–41)
Albumin: 2.5 g/dL — ABNORMAL LOW (ref 3.5–5.0)
BUN: 10 mg/dL (ref 6–20)
CALCIUM: 8.6 mg/dL — AB (ref 8.9–10.3)
CO2: 25 mmol/L (ref 22–32)
Chloride: 102 mmol/L (ref 101–111)
Creatinine, Ser: 0.69 mg/dL (ref 0.44–1.00)
GFR calc non Af Amer: >60 mL/min (ref >60)
Glucose, Bld: 116 mg/dL — ABNORMAL HIGH (ref 65–99)
Potassium: 3.1 mmol/L — ABNORMAL LOW (ref 3.5–5.1)
SODIUM: 136 mmol/L (ref 135–145)
TOTAL PROTEIN: 6.1 g/dL — AB (ref 6.5–8.1)
Total Bilirubin: 0.7 mg/dL (ref 0.3–1.2)

## 2016-12-30 LAB — PROTIME-INR
INR: 1.14
PROTHROMBIN TIME: 14.5 s (ref 11.4–15.2)

## 2016-12-30 LAB — MAGNESIUM: Magnesium: 2 mg/dL (ref 1.7–2.4)

## 2016-12-30 LAB — CULTURE, RESPIRATORY W GRAM STAIN: Culture: NORMAL

## 2016-12-30 MED ORDER — POTASSIUM CHLORIDE CRYS ER 20 MEQ PO TBCR
40.0000 meq | EXTENDED_RELEASE_TABLET | Freq: Every day | ORAL | Status: DC
  Administered 2016-12-30 – 2016-12-31 (×2): 40 meq via ORAL
  Filled 2016-12-30 (×3): qty 2

## 2016-12-30 NOTE — Progress Notes (Signed)
Hospitalist progress note         Lisa Mclaughlin  VQX:[id number] DOB: 1950-09-02 DOA: 12/26/2016 PCP: Tonia Ghent, MD   Specialists:   Brief Narrative:  51 fem severe dementia, bipolar, hiatal hernia-admit 12/25 with seizures and intubated-loaded with Fosphenytoin and Keppra and admit by CCM   Assessment & Plan:   Assessment:  Diagnoses of Altered mental state, Acute respiratory failure ([place]), and Delirium were pertinent to this visit.  Risk of Aspiration PNa0--no overt evidence-holding off of Abx, Dys 3 diet Status Epilepticus-avoiding wellbutrin/abilify-resuming citalopram and ativan as per neuro-need [facility] testing dementia Mod-severe demtnia-cont Namenda 10, aricept 10 Htn-holding coreg as per CCM-Resume Imdur today Hypokalemia-start kdur 40 daily Bipolar Hiatal hernia   DVT prophylaxis: lovenox Code Status: full  Family Communication: d/w s/o at the bedside inpatient pending reso lution Disposition Plan: d/w home with hh   Consultants:   neuro  Procedures:   none   Antimicrobials:   none   Subjective:  Awake but non-coherent Doing fair husband states thinks she might better be served at [employer] a full grilled cheese sandwich  Objective: Vitals:   12/29/16 1904 12/29/16 2156 12/30/16 0100 12/30/16 0556  BP: (!) 151/68 (!) 157/81 (!) 143/77 (!) 155/78  Pulse: 81 87 97 97  Resp: 18 18 18 18   Temp: 98.9 F (37.2 C) 98.4 F (36.9 C) 98.7 F (37.1 C) 98.9 F (37.2 C)  TempSrc: Axillary Axillary Axillary Axillary  SpO2: 98% 98% 96% 96%  Weight:    73.5 kg (162 lb 0.6 oz)  Height:        Intake/Output Summary (Last 24 hours) at 12/30/2016 0806 Last data filed at 12/29/2016 1631 Gross per 24 hour  Intake 540 ml  Output 700 ml  Net -160 ml   Filed Weights   12/28/16 0500 12/29/16 0500 12/30/16 0556  Weight: 76.1 kg (167 lb 12.3 oz) 71.6 kg (157 lb 13.6 oz) 73.5 kg (162 lb 0.6 oz)    Examination:  eomi slight confusion nad cta b no added  sound abd soft nt nd Neuro intact  Data Reviewed: I have personally reviewed following labs and imaging studies  CBC: Recent Labs  Lab 12/26/16 1933 12/27/16 0245 12/28/16 0426 12/29/16 0426 12/30/16 0422  WBC 20.7* 18.6* 13.6* 13.8* 10.7*  NEUTROABS  --  15.7* 11.0*  --   --   HGB 15.2* 15.8* 14.3 14.5 14.3  HCT 44.5 45.8 42.2 43.0 41.8  MCV 94.5 94.8 94.8 95.3 93.9  PLT 294 274 231 247 003   Basic Metabolic Panel: Recent Labs  Lab 12/26/16 1041 12/26/16 1933 12/27/16 0903 12/28/16 0426 12/29/16 0426 12/30/16 0422  NA 136  --  138 136 135 136  K 3.9  --  3.4* 3.1* 4.1 3.1*  CL 104  --  102 102 103 102  CO2 22  --  23 24 20* 25  GLUCOSE 117*  --  140* 125* 116* 116*  BUN 10  --  8 6 <5* 10  CREATININE 0.90 0.90 0.71 0.62 0.61 0.69  CALCIUM 8.9  --  8.3* 8.0* 8.5* 8.6*  MG  --   --  1.7 1.8 2.1 2.0  PHOS  --   --   --   --  1.5*  --    GFR: Estimated Creatinine Clearance: 66.4 mL/min (by C-G formula based on SCr of 0.69 mg/dL). Liver Function Tests: Recent Labs  Lab 12/27/16 0903 12/28/16 0426 12/30/16 0422  AST 31 24 23  ALT 29 22 29   ALKPHOS 82 80 80  BILITOT 0.9 0.8 0.7  PROT 6.4* 5.6* 6.1*  ALBUMIN 3.1* 2.7* 2.5*   No results for input(s): LIPASE, AMYLASE in the last 168 hours. No results for input(s): AMMONIA in the last 168 hours. Coagulation Profile: Recent Labs  Lab 12/27/16 0245 12/28/16 0426 12/30/16 0422  INR 1.10 1.15 1.14   Cardiac Enzymes: No results for input(s): CKTOTAL, CKMB, CKMBINDEX, TROPONINI in the last 168 hours. CBG: Recent Labs  Lab 12/26/16 1046 12/27/16 0259  GLUCAP 125* 205*   Urine analysis:    Component Value Date/Time   COLORURINE YELLOW 12/27/2016 0352   APPEARANCEUR TURBID (A) 12/27/2016 0352   LABSPEC 1.024 12/27/2016 0352   PHURINE 5.0 12/27/2016 0352   GLUCOSEU >=500 (A) 12/27/2016 0352   HGBUR LARGE (A) 12/27/2016 0352   HGBUR trace-lysed 07/26/2007 1551   BILIRUBINUR NEGATIVE 12/27/2016 0352    KETONESUR NEGATIVE 12/27/2016 0352   PROTEINUR 100 (A) 12/27/2016 0352   UROBILINOGEN 0.2 07/26/2007 1551   NITRITE NEGATIVE 12/27/2016 0352   LEUKOCYTESUR TRACE (A) 12/27/2016 0352     Radiology Studies: Reviewed images personally in health database    Scheduled Meds: . carvedilol  3.125 mg Oral BID WC  . chlorhexidine  15 mL Mouth Rinse BID  . donepezil  10 mg Oral QHS  . enoxaparin (LOVENOX) injection  40 mg Subcutaneous Q24H  . isosorbide mononitrate  30 mg Oral Daily  . levETIRAcetam  500 mg Oral BID  . LORazepam  0.5 mg Oral Q6H  . mouth rinse  15 mL Mouth Rinse q12n4p  . memantine  10 mg Oral Daily  . multivitamin with minerals  1 tablet Oral Daily  . thiamine  100 mg Oral Daily   Or  . thiamine  100 mg Intravenous Daily   Continuous Infusions: . sodium chloride    . sodium chloride 75 mL/hr at 12/29/16 2148     LOS: 4 days    Time spent: [place], MD [facility]   If 7PM-7AM, please contact night-coverage [email or web address] Password [email or web address] 12/30/2016, 8:06 AM

## 2016-12-30 NOTE — Progress Notes (Signed)
Neurology Progress Note   S:// Patient seen and examined.  No reported seizures.  No acute problems.   O:// Current vital signs: BP 116/61 (BP Location: Left Arm)   Pulse 82   Temp 98.7 F (37.1 C) (Axillary)   Resp 20   Ht 5\' 3"  (1.6 m)   Wt 73.5 kg (162 lb 0.6 oz)   SpO2 90%   BMI 28.70 kg/m   Vital signs in last 24 hours: Temp:  [97.8 F (36.6 C)-98.9 F (37.2 C)] 98.7 F (37.1 C) (12/29 1032) Pulse Rate:  [74-97] 82 (12/29 1032) Resp:  [18-24] 20 (12/29 1032) BP: (116-183)/(61-102) 116/61 (12/29 1032) SpO2:  [90 %-100 %] 90 % (12/29 1032) Weight:  [73.5 kg (162 lb 0.6 oz)] 73.5 kg (162 lb 0.6 oz) (12/29 0556) Unchanged exam from yesterday. General: Patient is awake, alert, no acute distress HEENT: Normocephalic, atraumatic, dry mucous membranes. Subtle tremors of lips. Hypomimia+ Lungs clear to auscultation Cardiovascular: S1-S2 heard, regular rate rhythm Abdomen soft nondistended nontender Neurological exam Mental status: Awake alert oriented to self.  Poor attention concentration. Speech is mildly dysarthric and slow.  Follows commands Cranial nerves: Pupils equal round reactive to light, extra movements intact, visual fields full, facial symmetry present, shoulder shrug intact, tongue midline Motor exam: Antigravity without drift in all 4 extremities. Sensory exam: Intact light touch all over Coordination: Slow but intact finger nose finger Gait exam was deferred.  Medications  Current Facility-Administered Medications:  .  0.9 %  sodium chloride infusion, 250 mL, Intravenous, PRN, Etheleen Nicks, MD .  0.9 %  sodium chloride infusion, , Intravenous, Continuous, Deterding, Guadelupe Sabin, MD, Last Rate: 75 mL/hr at 12/29/16 2148 .  acetaminophen (TYLENOL) suppository 650 mg, 650 mg, Rectal, Q6H PRN, Greta Doom, MD, 650 mg at 12/30/16 0844 .  carvedilol (COREG) tablet 3.125 mg, 3.125 mg, Oral, BID WC, Etheleen Nicks, MD, 3.125 mg at 12/30/16 0839 .   chlorhexidine (PERIDEX) 0.12 % solution 15 mL, 15 mL, Mouth Rinse, BID, Amie Portland, MD, 15 mL at 12/30/16 0840 .  cloNIDine (CATAPRES) tablet 0.1 mg, 0.1 mg, Oral, BID PRN, Etheleen Nicks, MD .  donepezil (ARICEPT) tablet 10 mg, 10 mg, Oral, QHS, Amie Portland, MD, 10 mg at 12/29/16 2126 .  enoxaparin (LOVENOX) injection 40 mg, 40 mg, Subcutaneous, Q24H, Etheleen Nicks, MD, 40 mg at 12/29/16 2126 .  isosorbide mononitrate (IMDUR) 24 hr tablet 30 mg, 30 mg, Oral, Daily, Etheleen Nicks, MD, 30 mg at 12/30/16 0844 .  levETIRAcetam (KEPPRA) tablet 500 mg, 500 mg, Oral, BID, Etheleen Nicks, MD, 500 mg at 12/30/16 0840 .  LORazepam (ATIVAN) tablet 0.5 mg, 0.5 mg, Oral, Q6H, Amie Portland, MD, 0.5 mg at 12/30/16 0839 .  MEDLINE mouth rinse, 15 mL, Mouth Rinse, q12n4p, Amie Portland, MD .  memantine [facility]) tablet 10 mg, 10 mg, Oral, Daily, Amie Portland, MD, 10 mg at 12/30/16 0840 .  multivitamin with minerals tablet 1 tablet, 1 tablet, Oral, Daily, Etheleen Nicks, MD, 1 tablet at 12/30/16 [phone]  thiamine (VITAMIN B-1) tablet 100 mg, 100 mg, Oral, Daily, 100 mg at 12/30/16 0841 **OR** thiamine (B-1) injection 100 mg, 100 mg, Intravenous, Daily, Julianne Rice, MD, 100 mg at 12/28/16 1059 Labs CBC    Component Value Date/Time   WBC 10.7 (H) 12/30/2016 0422   RBC 4.45 12/30/2016 0422   HGB 14.3 12/30/2016 0422   HCT 41.8 12/30/2016 0422   PLT 282 12/30/2016 0422  MCV 93.9 12/30/2016 0422   MCH 32.1 12/30/2016 0422   MCHC 34.2 12/30/2016 0422   RDW 13.1 12/30/2016 0422   LYMPHSABS 1.0 12/28/2016 0426   MONOABS 1.6 (H) 12/28/2016 0426   EOSABS 0.0 12/28/2016 0426   BASOSABS 0.0 12/28/2016 0426    CMP     Component Value Date/Time   NA 136 12/30/2016 0422   K 3.1 (L) 12/30/2016 0422   CL 102 12/30/2016 0422   CO2 25 12/30/2016 0422   GLUCOSE 116 (H) 12/30/2016 0422   BUN 10 12/30/2016 0422   CREATININE 0.69 12/30/2016 0422   CREATININE 0.82 06/18/2015 1628   CALCIUM 8.6 (L)  12/30/2016 0422   PROT 6.1 (L) 12/30/2016 0422   ALBUMIN 2.5 (L) 12/30/2016 0422   AST 23 12/30/2016 0422   ALT 29 12/30/2016 0422   ALKPHOS 80 12/30/2016 0422   BILITOT 0.7 12/30/2016 0422   GFRNONAA >60 12/30/2016 0422   GFRAA >60 12/30/2016 0422    Imaging I have reviewed images in epic and the results pertinent to this consultation are: MRI brain shows no acute finding but shows worsening of generalized atrophy including hippocampal atrophy compared to scan from 5 years ago.  Assessment:  66 year old woman with a past medical history of advanced dementia brought to the hospital for witnessed seizure-like activity and had another seizure in the hospital. Given multiple doses of Ativan and then again had possible seizure activity concerning for status and needed intubation. Now extubated.  Routine in LTM EEG not concerning for seizures and showed slowing only. Started on [place] during this hospitalization. Also has parkinsonian features on exam.  [name] discontinued.  Was also on Wellbutrin which has been discontinued as it lowers seizure threshold. Had some leukocytosis, likely secondary to aspiration pneumonia-primary team managing.   Impression:  Seizures in the setting of dementia and possible benzo withdrawal-doses were changed recently for her Ativan.  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  Recommendations: Continue Keppra 500 daily Maintain seizure precautions Follow-up with outpatient neurologist and outpatient dementia specialist. Can resume her benzodiazepine-0.5 mg Ativan every 6 hours. Can resume her citalopram. Would recommend avoiding [name] going forward as well as avoiding using Wellbutrin going forward for the parkinsonian side effects and lowering of seizure threshold side effects respectively. I answered all the questions asked by her husband  at the bedside. I will communicate my plan to the primary hospitalist.  Neurology service will be available as needed.  Please call with questions.   -- Amie [place], MD [facility] [phone] If 7pm to 7am, please call on call as listed on AMION.

## 2016-12-30 NOTE — Progress Notes (Signed)
Occupational Therapy Treatment Patient Details Name: Lisa Mclaughlin MRN: [id number] DOB: 1950-09-12 Today's Date: 12/30/2016    History of present illness Patient is 66 year old Caucasian female with past medical history of severe dementia with depression also history of agitation benzodiazepine dependent has been sleep for the past 2 days get down on the benzo happened 12/25 when she was about to go to the shower patient had witnessed seizure by her husband no evidence of head trauma no witnessed head trauma patient continued to seize.     OT comments  Pt is making slow progress toward goals.  She requires min A +2 for functional mobility, and max - total A for ADLs.  02 not on upon OT arrival with sats at 80% on RA.  02 replaced with sats slowly increasing to 94% on 4L supplemental 02.   Spouse's preference is that pt return home with [facility] therapies, however, he is open to the possibility of SNF, if he doesn't progress well enough for the hired caregivers to manage her while he is at work.  Will continue to follow   Follow Up Recommendations  SNF (prefers home discharge)   Equipment Recommendations  3 in 1 bedside commode    Recommendations for Other Services      Precautions / Restrictions Precautions Precautions: Fall Precaution Comments: Alzheimer's        Mobility Bed Mobility Overal bed mobility: Needs Assistance Bed Mobility: Supine to Sit     Supine to sit: Mod assist     General bed mobility comments: assist with all aspects.  Pt fatigued today   Transfers Overall transfer level: Needs assistance Equipment used: 1 person hand held assist Transfers: Sit to/from [facility] Sit to Stand: Mod assist Stand pivot transfers: Min guard;+2 physical assistance       General transfer comment: Pt with LOB posteriorly upon standing.   requires assist for balance.       Balance Overall balance assessment: Needs assistance Sitting-balance support: Feet  supported;No upper extremity supported Sitting balance-Leahy Scale: Poor Sitting balance - Comments: Pt required min - mod A with Lt lateral and anterior lean    Standing balance support: Bilateral upper extremity supported Standing balance-Leahy Scale: Poor Standing balance comment: requires min A +2                           ADL either performed or assessed with clinical judgement   ADL Overall ADL's : Needs assistance/impaired                         Toilet Transfer: Minimal assistance;+2 for physical assistance;Ambulation;Comfort height toilet   Toileting- Clothing Manipulation and Hygiene: Total assistance;Sit to/from stand Toileting - Clothing Manipulation Details (indicate cue type and reason): Pt incontinent of urine.  Assisted with peri care      Functional mobility during ADLs: Minimal assistance;+2 for physical assistance       Vision       Perception     Praxis      Cognition Arousal/Alertness: Awake/alert;Lethargic Behavior During Therapy: Flat affect Overall Cognitive Status: Impaired/Different from baseline                                 General Comments: Pt with h/o Alzheimer's, but spouse reports pt is not at baseline.  She will follow simple one step commands with cuing.  She has been more lethargic than normal, and limited interaction         Exercises     Shoulder Instructions       General Comments Pt supine without 02 in place upon 02 entrance.  02 sats on RA 80%.  sats slowly increased to 94% on 4L  supplemental 02.   02 sats 94% at end of session.   Spoke with spouse re: SNF vs. home.  His preference is for home, but also is realistic that if she doesn't progress as expected is agreeable to considering SNF.      Pertinent Vitals/ Pain       Pain Assessment: 0-10  Home Living                                          Prior Functioning/Environment              Frequency  Min  2X/week        Progress Toward Goals  OT Goals(current goals can now be found in the care plan section)  Progress towards OT goals: Progressing toward goals(slowly)     Plan Discharge plan needs to be updated;Equipment recommendations need to be updated    Co-evaluation                 AM-PAC PT "6 Clicks" Daily Activity     Outcome Measure   Help from another person eating meals?: A Lot Help from another person taking care of personal grooming?: A Lot Help from another person toileting, which includes using toliet, bedpan, or urinal?: Total Help from another person bathing (including washing, rinsing, drying)?: Total Help from another person to put on and taking off regular upper body clothing?: Total Help from another person to put on and taking off regular lower body clothing?: Total 6 Click Score: 8    End of Session Equipment Utilized During Treatment: Oxygen  OT Visit Diagnosis: Unsteadiness on feet (R26.81);Cognitive communication deficit (R41.841)   Activity Tolerance Patient limited by fatigue   Patient Left in bed;with call bell/phone within reach   Nurse Communication Mobility status        Time: 3435-6861 OT Time Calculation (min): 68 min  Charges: OT General Charges $OT Visit: 1 Visit OT Treatments $Self Care/Home Management : 38-52 mins $Therapeutic Activity: 8-22 mins  [facility], OTR/L [phone]    Lucille Passy M 12/30/2016, 1:29 PM

## 2016-12-30 NOTE — Plan of Care (Signed)
Pt resting quietly in bed, NAD. Impulsive, attempts to climb OOB on occasion. Will monitor.

## 2016-12-30 NOTE — Progress Notes (Signed)
Occupational Therapy Evaluation (late entry)  Pt presents to OT with the below listed deficits.  She demonstrates decreased activity tolerance, decreased balance, generalized weakness, impaired cognition.  Currently, she requires max A for self feeding and grooming (was doing this with supervision PTA), total A for remainder of ADLs (baseline), and min A +2 for functional mobility (previously was mod I).   Spouse reports she is not back to her cognitive baseline.   She lives with spouse and has hired caregivers while he works.  They live on second floor apartment.  Plan is to return home at discharge, anticipate she may benefit from [facility] services, depending on progress.  If she is not safe to negotiate flight of stairs, may need ambulance transfer - hopefully, she will progress adequately to avoid this.    No DME recommended    12/29/16 1800  OT Visit Information  Last OT Received On 12/30/16  Assistance Needed +1  History of Present Illness Patient is 66 year old Caucasian female with past medical history of severe dementia with depression also history of agitation benzodiazepine dependent has been sleep for the past 2 days get down on the benzo happened 12/25 when she was about to go to the shower patient had witnessed seizure by her husband no evidence of head trauma no witnessed head trauma patient continued to seize.    Precautions  Precautions Fall  Precaution Comments Alzheimer's   Home Living  Family/patient expects to be discharged to: Private residence  Living Arrangements Spouse/significant other (has hired caregivers as well)  Available Help at Discharge Family;Available 24 hours/day  Type of Home Apartment  Home Access Stairs to enter  Entrance Stairs-Number of Steps flight  Entrance Stairs-Rails Right  Home Layout One level  Bathroom Shower/Tub Tub/shower unit  Automotive engineer (grab bar)  Additional Comments Pt has hired caregivers 7:30-5:30 M-Th.   Spouse is with pt the remainder of the time   Prior Function  Level of Independence Needs assistance  Gait / Transfers Assistance Needed Pt was mod I with ambualation without AD   ADL's / Homemaking Assistance Needed Pt required assist with bathing, dressing, toileting.  She was able to feed self and brush teeth with direct supervision   Communication / Swallowing Assistance Needed limited communication, but able to hold limited conversations with spouse   Comments Pt accompanied spouse to grocery store and on community outings   Communication  Communication (limited communication )  Pain Assessment  Pain Assessment No/denies pain  Cognition  Arousal/Alertness Awake/alert;Lethargic  Behavior During Therapy Flat affect  Overall Cognitive Status Impaired/Different from baseline  General Comments Pt with h/o Alzheimer's, but spouse reports pt is not at baseline.  She will follow simple one step commands with cuing.  She has been more lethargic than normal, and limited interaction   Upper Extremity Assessment  Upper Extremity Assessment Generalized weakness  Lower Extremity Assessment  Lower Extremity Assessment Defer to PT evaluation  Cervical / Trunk Assessment  Cervical / Trunk Assessment Kyphotic  ADL  Overall ADL's  Needs assistance/impaired  Eating/Feeding Maximal assistance;Bed level  Eating/Feeding Details (indicate cue type and reason) Pt did not attempt to self feed.  She required hand over hand assist to drink from cup, and max prompting   Grooming Wash/dry hands;Wash/dry face;Brushing hair;Oral care;Total assistance;Sitting  Upper Body Bathing Total assistance  Lower Body Bathing Total assistance;Sit to/from stand  Upper Body Dressing  Total assistance;Sitting  Lower Body Dressing Total assistance;Sit to/from Retail buyer  Minimal assistance;+2 for physical assistance;Ambulation;Comfort height toilet  Toileting- Clothing Manipulation and Hygiene Total assistance;Sit  to/from stand  Functional mobility during ADLs Minimal assistance;+2 for physical assistance  Vision- Assessment  Additional Comments Pt unable to participate in formal assessment   Bed Mobility  Overal bed mobility Needs Assistance  Bed Mobility Supine to Sit  Supine to sit Min assist  General bed mobility comments assist to guide LEs off bed, to lift trunk, and to scoot to EOB   Transfers  Overall transfer level Needs assistance  Equipment used 1 person hand held assist  Transfers Sit to/from Stand;Stand Pivot Transfers  Sit to Stand Min assist  Stand pivot transfers Min assist;+2 physical assistance  General transfer comment Pt unsteady   Balance  Overall balance assessment Needs assistance  Sitting-balance support Feet supported;No upper extremity supported  Sitting balance-Leahy Scale Fair  Standing balance support Bilateral upper extremity supported  Standing balance-Leahy Scale Poor  General Comments  General comments (skin integrity, edema, etc.) Pt using  4L 02 via [place]  DOE 2/4   OT - End of Session  Equipment Utilized During Treatment Oxygen  Activity Tolerance Patient limited by fatigue  Patient left in bed;with call bell/phone within reach;with bed alarm set;with family/visitor present  Nurse Communication Mobility status  OT Assessment  OT Recommendation/Assessment Patient needs continued OT Services  OT Visit Diagnosis Unsteadiness on feet (R26.81);Cognitive communication deficit (R41.841)  OT Problem List Decreased strength;Decreased activity tolerance;Impaired balance (sitting and/or standing);Decreased cognition;Decreased safety awareness;Cardiopulmonary status limiting activity  OT Plan  OT Frequency (ACUTE ONLY) Min 2X/week  OT Treatment/Interventions (ACUTE ONLY) Self-care/ADL training;Therapeutic exercise;DME and/or AE instruction;Therapeutic activities;Energy conservation;Cognitive remediation/compensation;Patient/family education;Balance training  AM-PAC OT  "6 Clicks" Daily Activity Outcome Measure  Help from another person eating meals? 2  Help from another person taking care of personal grooming? 2  Help from another person toileting, which includes using toliet, bedpan, or urinal? 1  Help from another person bathing (including washing, rinsing, drying)? 1  Help from another person to put on and taking off regular upper body clothing? 1  Help from another person to put on and taking off regular lower body clothing? 1  6 Click Score 8  ADL G Code Conversion CM  OT Recommendation  Follow Up Recommendations Home health OT;Supervision/Assistance - 24 hour (Pt may require ambulance transport )  Individuals Consulted  Consulted and Agree with Results and Recommendations Family member/caregiver  Family Member Consulted spouse   Acute Rehab OT Goals  Patient Stated Goal to go home   OT Goal Formulation With family  Time For Goal Achievement 01/13/17  OT Time Calculation  OT Start Time (ACUTE ONLY) 1706  OT Stop Time (ACUTE ONLY) 1752  OT Time Calculation (min) 46 min  OT General Charges  $OT Visit 1 Visit  OT Evaluation  $OT Eval Moderate Complexity 1 Mod  OT Treatments  $Self Care/Home Management  8-22 mins  $Therapeutic Activity 8-22 mins  Written Expression  Dominant Hand Right   Lucille Passy, OTR/L [phone]

## 2016-12-31 NOTE — Progress Notes (Signed)
Hospitalist progress note         Lisa Mclaughlin  OMV:[id number] DOB: Sep 05, 1950 DOA: 12/26/2016 PCP: Tonia Ghent, MD   Specialists:   Brief Narrative:  12 fem severe dementia, bipolar, hiatal hernia-admit 12/25 with seizures and intubated-loaded with Fosphenytoin and Keppra and admit by CCM   Assessment & Plan:   Assessment:  Diagnoses of Altered mental state, Acute respiratory failure ([place]), and Delirium were pertinent to this visit.  Risk of Aspiration PNa--no overt evidence-holding off of Abx, Dys 3 diet Acute hypoxic resp failure-needs oxygen as 88% on RA at rest-needs oxygen if will keep on Status Epilepticus-avoiding wellbutrin/abilify-resuming citalopram and ativan as per neuro-need OP academic center testing dementia Mod-severe demtnia-cont Namenda 10, aricept 10 Htn-continue coreg 3.125, clonidine 0.1 bid, Imdur 30 od today Hypokalemia-start kdur 40 daily Bipolar-see above meds changes--wouild not use meds which lower Sz threshold Hiatal hernia   DVT prophylaxis: lovenox Code Status: full  Family Communication: d/w husband at bedsdie Disposition Plan: family wishing now SNF level care-s/w input pending   Consultants:   neuro  Procedures:   none   Antimicrobials:   none   Subjective:  More coherent-no distress Eating well Husband bedside  Objective: Vitals:   12/31/16 0233 12/31/16 0500 12/31/16 0917 12/31/16 1314  BP: (!) 167/86  119/72 130/83  Pulse: 86  78 74  Resp: 18  20 16   Temp: 98.4 F (36.9 C)  97.9 F (36.6 C) 97.8 F (36.6 C)  TempSrc: Oral  Oral Oral  SpO2: 96%  93% 94%  Weight:  73 kg (160 lb 15 oz)    Height:        Intake/Output Summary (Last 24 hours) at 12/31/2016 1333 Last data filed at 12/31/2016 1321 Gross per 24 hour  Intake 600 ml  Output -  Net 600 ml   Filed Weights   12/29/16 0500 12/30/16 0556 12/31/16 0500  Weight: 71.6 kg (157 lb 13.6 oz) 73.5 kg (162 lb 0.6 oz) 73 kg (160 lb 15 oz)    Examination:  eomi  slight confusion no distress cta b no added sound s1 s2 no m/r/g abd soft nt nd Neuro intact to movement and power-confused overall  Data Reviewed: I have personally reviewed following labs and imaging studies  CBC: Recent Labs  Lab 12/26/16 1933 12/27/16 0245 12/28/16 0426 12/29/16 0426 12/30/16 0422  WBC 20.7* 18.6* 13.6* 13.8* 10.7*  NEUTROABS  --  15.7* 11.0*  --   --   HGB 15.2* 15.8* 14.3 14.5 14.3  HCT 44.5 45.8 42.2 43.0 41.8  MCV 94.5 94.8 94.8 95.3 93.9  PLT 294 274 231 247 628   Basic Metabolic Panel: Recent Labs  Lab 12/26/16 1041 12/26/16 1933 12/27/16 0903 12/28/16 0426 12/29/16 0426 12/30/16 0422  NA 136  --  138 136 135 136  K 3.9  --  3.4* 3.1* 4.1 3.1*  CL 104  --  102 102 103 102  CO2 22  --  23 24 20* 25  GLUCOSE 117*  --  140* 125* 116* 116*  BUN 10  --  8 6 <5* 10  CREATININE 0.90 0.90 0.71 0.62 0.61 0.69  CALCIUM 8.9  --  8.3* 8.0* 8.5* 8.6*  MG  --   --  1.7 1.8 2.1 2.0  PHOS  --   --   --   --  1.5*  --    GFR: Estimated Creatinine Clearance: 66.2 mL/min (by C-G formula based on SCr of 0.69 mg/dL). Liver  Function Tests: Recent Labs  Lab 12/27/16 0903 12/28/16 0426 12/30/16 0422  AST 31 24 23   ALT 29 22 29   ALKPHOS 82 80 80  BILITOT 0.9 0.8 0.7  PROT 6.4* 5.6* 6.1*  ALBUMIN 3.1* 2.7* 2.5*   No results for input(s): LIPASE, AMYLASE in the last 168 hours. No results for input(s): AMMONIA in the last 168 hours. Coagulation Profile: Recent Labs  Lab 12/27/16 0245 12/28/16 0426 12/30/16 0422  INR 1.10 1.15 1.14   Cardiac Enzymes: No results for input(s): CKTOTAL, CKMB, CKMBINDEX, TROPONINI in the last 168 hours. CBG: Recent Labs  Lab 12/26/16 1046 12/27/16 0259  GLUCAP 125* 205*   Urine analysis:    Component Value Date/Time   COLORURINE YELLOW 12/27/2016 0352   APPEARANCEUR TURBID (A) 12/27/2016 0352   LABSPEC 1.024 12/27/2016 0352   PHURINE 5.0 12/27/2016 0352   GLUCOSEU >=500 (A) 12/27/2016 0352   HGBUR LARGE  (A) 12/27/2016 0352   HGBUR trace-lysed 07/26/2007 1551   BILIRUBINUR NEGATIVE 12/27/2016 0352   KETONESUR NEGATIVE 12/27/2016 0352   PROTEINUR 100 (A) 12/27/2016 0352   UROBILINOGEN 0.2 07/26/2007 1551   NITRITE NEGATIVE 12/27/2016 0352   LEUKOCYTESUR TRACE (A) 12/27/2016 0352     Radiology Studies: Reviewed images personally in health database    Scheduled Meds: . carvedilol  3.125 mg Oral BID WC  . chlorhexidine  15 mL Mouth Rinse BID  . donepezil  10 mg Oral QHS  . enoxaparin (LOVENOX) injection  40 mg Subcutaneous Q24H  . isosorbide mononitrate  30 mg Oral Daily  . levETIRAcetam  500 mg Oral BID  . LORazepam  0.5 mg Oral Q6H  . mouth rinse  15 mL Mouth Rinse q12n4p  . memantine  10 mg Oral Daily  . multivitamin with minerals  1 tablet Oral Daily  . potassium chloride  40 mEq Oral Daily  . thiamine  100 mg Oral Daily   Or  . thiamine  100 mg Intravenous Daily   Continuous Infusions: . sodium chloride    . sodium chloride 75 mL/hr at 12/31/16 0140     LOS: 5 days    Time spent: [place], MD [facility] ([facility]   If 7PM-7AM, please contact night-coverage [email or web address] Password [email or web address] 12/31/2016, 1:33 PM

## 2016-12-31 NOTE — Progress Notes (Addendum)
Physical Therapy Treatment Patient Details Name: Lisa Mclaughlin MRN: [id number] DOB: [birth year] Today's Date: 12/31/2016    History of Present Illness Patient is 66 year old Caucasian female with past medical history of severe dementia with depression also history of agitation benzodiazepine dependent has been sleep for the past 2 days get down on the benzo happened 12/25 when she was about to go to the shower patient had witnessed seizure by her husband no evidence of head trauma no witnessed head trauma patient continued to seize.      PT Comments    Patient seen for activity progression. Patient continues to require increased physical assist at this time and at times demonstrates concerning symptomology with mobility. At rest patient received on room air with saturations 86% HR 84. Applied 2 liters saturations improved to 97%. Patient ambulted in room and hall requiring increased physical assist throughout. 2 episodes of patient becoming diaphoretic, glazed over vision, less conversive and noted LE weakness requiring increased physical assist to maintain upright while awaiting chair to safely sit patient. Unfortunately, patient unable to communicate exactly what she was feeling other then "not good". VS stable despite physical presentation.  HR 80s-108 max, BP stable, O2 saturations 90%  Spoke with spouse at length regarding concerns for mobility as patient lives in a 2nd story dwelling (plan to move to first floor unit but unable to do so until January). Given current level of deconditioning, need for physical assist, and current increased care giver responsibilities, feel patient would benefit greatly from comprehensive therapies for caregiver education/training as well as therapies to improve overall functional level for patient. Patient is currently not safe for d/c home in current condition as patient would be significant fall risk on stairs and during functional tasks required for basic living.  Recommend CIR consult at this time as patient was very functional and mobile prior to admission despite history of cognitive impairments.  Will continue to see as indicated and progress as tolerated.  Follow Up Recommendations  CIR;Supervision/Assistance - 24 hour     Equipment Recommendations  Wheelchair (measurements PT);Wheelchair cushion (measurements PT)(handicapped apt on 1st floor)    Recommendations for Other Services       Precautions / Restrictions Precautions Precautions: Fall Precaution Comments: Alzheimer's     Mobility  Bed Mobility Overal bed mobility: Needs Assistance Bed Mobility: Supine to Sit     Supine to sit: Mod assist     General bed mobility comments: assist with all aspects.  Pt fatigued today   Transfers Overall transfer level: Needs assistance Equipment used: 1 person hand held assist Transfers: Sit to/from Stand Sit to Stand: Min assist;Mod assist         General transfer comment: Increased assist to power up to standing, LOB noted posterior x2 requiring moderate physical assist  Ambulation/Gait Ambulation/Gait assistance: Min assist;Mod assist;+2 physical assistance(4 episodes of moderate assist for LE buckling/weakness noted) Ambulation Distance (Feet): 80 Feet(x2 with seated rest break and VS monitoring required) Assistive device: 2 person hand held assist Gait Pattern/deviations: Step-to pattern;Decreased stride length;Shuffle Gait velocity: decreased Gait velocity interpretation: Below normal speed for age/gender General Gait Details: patient ambulted on room air reqiring increased physical assist throughout. 2 episodes of patient becoming diaphoretic, glazed over vision and noted LE weakness requiring increased physical assist to maintain upright while awaiting chair. VS stable despite physical presentation.  HR 80s-108 max, BP stable, O2 saturations 90%   Stairs Stairs: (NOT SAFE TO PERFORM)          Wheelchair  Mobility     Modified Rankin (Stroke Patients Only)       Balance Overall balance assessment: Needs assistance Sitting-balance support: Feet supported;No upper extremity supported Sitting balance-Leahy Scale: Poor Sitting balance - Comments: Pt required min - mod A with Lt lateral and anterior lean    Standing balance support: Bilateral upper extremity supported Standing balance-Leahy Scale: Poor Standing balance comment: requires hands on physical assist at all times +2                            Cognition Arousal/Alertness: Awake/alert;Lethargic Behavior During Therapy: Flat affect Overall Cognitive Status: Impaired/Different from baseline                                 General Comments: Pt with h/o Alzheimer's, but spouse reports pt is not at baseline.  She will follow simple one step commands with cuing.  She has been more lethargic than normal, and limited interaction       Exercises      General Comments        Pertinent Vitals/Pain Pain Assessment: Faces Faces Pain Scale: Hurts even more Pain Location: unable to state Pain Intervention(s): Monitored during session    Home Living                      Prior Function            PT Goals (current goals can now be found in the care plan section) Acute Rehab PT Goals Patient Stated Goal: to go home PT Goal Formulation: With patient/family Time For Goal Achievement: 01/12/17 Potential to Achieve Goals: Good Progress towards PT goals: Not progressing toward goals - comment(patient not progressing at this time)    Frequency    Min 3X/week      PT Plan Discharge plan needs to be updated    Co-evaluation              AM-PAC PT "6 Clicks" Daily Activity  Outcome Measure  Difficulty turning over in bed (including adjusting bedclothes, sheets and blankets)?: Unable Difficulty moving from lying on back to sitting on the side of the bed? : Unable Difficulty sitting down on and  standing up from a chair with arms (e.g., wheelchair, bedside commode, etc,.)?: Unable Help needed moving to and from a bed to chair (including a wheelchair)?: A Lot Help needed walking in hospital room?: A Lot Help needed climbing 3-5 steps with a railing? : A Lot 6 Click Score: 9    End of Session Equipment Utilized During Treatment: Gait belt Activity Tolerance: Patient tolerated treatment well Patient left: in bed;with call bell/phone within reach;with bed alarm set;with family/visitor present Nurse Communication: Mobility status PT Visit Diagnosis: Difficulty in walking, not elsewhere classified (R26.2);Muscle weakness (generalized) (M62.81)     Time: 8315-1761 PT Time Calculation (min) (ACUTE ONLY): 26 min  Charges:  $Gait Training: 8-22 mins $Therapeutic Activity: 8-22 mins                    G Codes:       Alben Deeds, PT DPT  Board Certified Neurologic Specialist [place] 12/31/2016, 3:29 PM

## 2016-12-31 NOTE — Progress Notes (Signed)
SATURATION QUALIFICATIONS: (This note is used to comply with regulatory documentation for home oxygen)  Patient Saturations on Room Air at Rest = 88%  Patient Saturations on Room Air while Ambulating = 87%  Patient Saturations on 2 Liters of oxygen while Ambulating = 96%  Pt requires use of supplemental 02 to allow her maintain 02 sats >88% thus allowing her to complete ADLs and functional mobility.    Lucille Passy, OTR/L [phone]

## 2016-12-31 NOTE — Telephone Encounter (Signed)
Late entry.  I called her husband.  We talked about the situation.  We hadn't repeated the MRI previously since she didn't have acute changes prev and it wasn't reasonable to put her through the MRI (it may have increased agitation, may have required sedation, etc).  Husband understood and agreed.   I wasn't expecting her to have seizures on wellbutrin; she had no prev SZ hx.   He thanked me for the call.    [name] has been discontinued.  Wellbutrin has been discontinued.  I appreciate the help of all involved.  I'll ask for neuro input, husband was asking about neuro follow up re: dementia and this is reasonable.

## 2016-12-31 NOTE — Progress Notes (Signed)
Inpatient Rehabilitation  Per PT request, patient was screened by Gunnar Fusi for appropriateness for an Inpatient Acute Rehab consult.  At this time we are recommending an Inpatient Rehab consult.  Text paged MD to notify; please order if you are agreeable.    Carmelia Roller., CCC/SLP Admission Coordinator  [facility]  Cell [phone]

## 2016-12-31 NOTE — Progress Notes (Addendum)
Occupational Therapy Treatment Patient Details Name: Lisa Mclaughlin MRN: [id number] DOB: 1950-05-15 Today's Date: 12/31/2016    History of present illness Patient is 66 year old Caucasian female with past medical history of severe dementia with depression also history of agitation benzodiazepine dependent has been sleep for the past 2 days get down on the benzo happened 12/25 when she was about to go to the shower patient had witnessed seizure by her husband no evidence of head trauma no witnessed head trauma patient continued to seize.     OT comments  Pt initially much brighter this am.  02 sats on RA 87%, 96% on 2L supplemental 02.   Pt initially tolerated more activity today, and ambulated in hallway ~120'.   Pt stopped interacting with blank stare, DOE 3/4, pt diaphoretic.  With prompting, pt indicated she was tired.  She was returned to room.  BP 144/87; HR 80; 02 sats 96% on 2L.   RN notified.   Spouse is still hoping for home discharge.   She has full flight of stairs to enter apt.  Will continue to follow.    Follow Up Recommendations  CIR     Equipment Recommendations  3 in 1 bedside commode    Recommendations for Other Services      Precautions / Restrictions Precautions Precautions: Fall Precaution Comments: Alzheimer's        Mobility Bed Mobility               General bed mobility comments: up in chair   Transfers Overall transfer level: Needs assistance Equipment used: 1 person hand held assist Transfers: Sit to/from Stand;Stand Pivot Transfers Sit to Stand: Min assist Stand pivot transfers: Min assist       General transfer comment: assist to steady     Balance Overall balance assessment: Needs assistance Sitting-balance support: Feet supported;No upper extremity supported Sitting balance-Leahy Scale: Fair     Standing balance support: Single extremity supported Standing balance-Leahy Scale: Poor Standing balance comment: requires UE support                             ADL either performed or assessed with clinical judgement   ADL   Eating/Feeding: Maximal assistance;Bed level Eating/Feeding Details (indicate cue type and reason): Pt drank from cup independently                      Toilet Transfer: Maximal assistance;+2 for safety/equipment   Toileting- Clothing Manipulation and Hygiene: Total assistance;Sit to/from stand       Functional mobility during ADLs: Minimal assistance;+2 for safety/equipment       Vision       Perception     Praxis      Cognition Arousal/Alertness: Awake/alert Behavior During Therapy: WFL for tasks assessed/performed Overall Cognitive Status: History of cognitive impairments - at baseline                                 General Comments: spouse reports she is close to baseline today         Exercises     Shoulder Instructions       General Comments Pt ambulated in hallway ~120'  Pt stopped interacting, and unable to report how she felt.  She eventually endorsed fatigue.   Returned to room.  BP 144/87; HR 80; 02 sats 96% on 2L supplemental 02,  pt diaphoretic.  RN notified.    At beginning of session 02 sats 87% on RA .      Pertinent Vitals/ Pain       Pain Assessment: No/denies pain  Home Living                                          Prior Functioning/Environment              Frequency  Min 2X/week        Progress Toward Goals  OT Goals(current goals can now be found in the care plan section)  Progress towards OT goals: Progressing toward goals     Plan Discharge plan needs to be updated;Equipment recommendations need to be updated    Co-evaluation                 AM-PAC PT "6 Clicks" Daily Activity     Outcome Measure   Help from another person eating meals?: A Lot Help from another person taking care of personal grooming?: A Lot Help from another person toileting, which includes using toliet,  bedpan, or urinal?: A Lot Help from another person bathing (including washing, rinsing, drying)?: Total Help from another person to put on and taking off regular upper body clothing?: Total Help from another person to put on and taking off regular lower body clothing?: Total 6 Click Score: 9    End of Session Equipment Utilized During Treatment: Oxygen  OT Visit Diagnosis: Unsteadiness on feet (R26.81);Cognitive communication deficit (R41.841)   Activity Tolerance Patient limited by fatigue   Patient Left in chair;with call bell/phone within reach;with chair alarm set;with family/visitor present   Nurse Communication Mobility status        Time: 1005-1045 OT Time Calculation (min): 40 min  Charges: OT General Charges $OT Visit: 1 Visit OT Treatments $Therapeutic Activity: 38-52 mins  [facility], OTR/L [phone]    Lucille Passy M 12/31/2016, 10:52 AM

## 2017-01-01 DIAGNOSIS — E876 Hypokalemia: Secondary | ICD-10-CM

## 2017-01-01 DIAGNOSIS — R030 Elevated blood-pressure reading, without diagnosis of hypertension: Secondary | ICD-10-CM

## 2017-01-01 DIAGNOSIS — F418 Other specified anxiety disorders: Secondary | ICD-10-CM

## 2017-01-01 DIAGNOSIS — F988 Other specified behavioral and emotional disorders with onset usually occurring in childhood and adolescence: Secondary | ICD-10-CM

## 2017-01-01 DIAGNOSIS — F319 Bipolar disorder, unspecified: Secondary | ICD-10-CM

## 2017-01-01 DIAGNOSIS — R569 Unspecified convulsions: Secondary | ICD-10-CM

## 2017-01-01 DIAGNOSIS — J9600 Acute respiratory failure, unspecified whether with hypoxia or hypercapnia: Secondary | ICD-10-CM

## 2017-01-01 DIAGNOSIS — D72829 Elevated white blood cell count, unspecified: Secondary | ICD-10-CM

## 2017-01-01 DIAGNOSIS — Z9981 Dependence on supplemental oxygen: Secondary | ICD-10-CM

## 2017-01-01 LAB — TRIGLYCERIDES: TRIGLYCERIDES: 165 mg/dL — AB (ref <150)

## 2017-01-01 NOTE — Consult Note (Signed)
            [facility] CM Primary Care Navigator  01/01/2017  Danijela Vessey 1950/02/09 [id number]   Seenpatientand husband (Randy)at the bedside to identify possible discharge needs.Patient has Alzheimer's disease.  Husband reportsthat patient had new onset of seizures (two- back to back) which he witnessed, that had resulted tothis admission.   Patient's husband endorsesDr. Elsie Stain with [facility] as the primary care provider.    [facility] [address] [address] [facility] without any problem.  Patient's husband reports managingmedications for patient at homeusing "pill box" system filled weekly.  Her husband has been providing transportation going to her doctors'appointments.  Patient has caregivers from [facility] [employer] 4 days a week (M- Th), 10 hours per day. Patient's husband (works) serves as the primary caregiver at home and takes care of patient on the weekends  [facility] (CIR) as recommended by therapistsprior to returning home.  Husband reports of plans to move to ground level apartment in a month or sooner due to concerns about ability to take her for MD visits after discharge.   Patient's husbandvoiced understanding to call primary care provider's office when patient gets back home, for a post discharge follow-up appointment within1-2weeksor sooner if needs arise. Patient letter (with PCP's contact number) was provided asareminder.   Discussed with patient's husband regarding [facility] CM services available for health management at homebuthedenies any needs or concerns for now. Patient's husbandexpressed understanding to seek referral from primary care provider to [facility] care management if deemed necessary and appropriate for services in the future.   [facility] care management information was provided for future needs thatpatient may have.   For  additional questions please contact:  Edwena Felty A. Leul Narramore, BSN, RN-BC [facility] PRIMARY CARE Navigator Cell: [phone]

## 2017-01-01 NOTE — Progress Notes (Signed)
Physical Therapy Treatment Patient Details Name: Lisa Mclaughlin MRN: [id number] DOB: 06-17-1950 Today's Date: 01/01/2017    History of Present Illness Patient is 66 year old Caucasian female with past medical history of severe dementia with depression also history of agitation benzodiazepine dependent has been sleep for the past 2 days get down on the benzo happened 12/25 when she was about to go to the shower patient had witnessed seizure by her husband no evidence of head trauma no witnessed head trauma patient continued to seize.      PT Comments    Pt was able to make progress towards her goals today, however continues to be limited in her safe mobility by decreased oxygen saturation on RW and generalized weakness. Pt currently, minA for bed mobility, and transfers and minA for ambulation with 2x LoB requiring modA for stabilizing. Pt required 3L O2 via nasal cannula to maintain SaO2 greater than 90%O2. Pt becomes increasingly unsteady when SaO2 drops below 90%O2. D/c plans continue to be appropriate given pt's level of function PTA. PT will continue to follow acutely.    Follow Up Recommendations  CIR;Supervision/Assistance - 24 hour     Equipment Recommendations  Wheelchair (measurements PT);Wheelchair cushion (measurements PT)(handicapped apt on 1st floor)    Recommendations for Other Services       Precautions / Restrictions Precautions Precautions: Fall Precaution Comments: Alzheimer's  Restrictions Weight Bearing Restrictions: No    Mobility  Bed Mobility Overal bed mobility: Needs Assistance Bed Mobility: Supine to Sit     Supine to sit: Min assist     General bed mobility comments: minA for trunk to upright, pt able to move LE off bed and scoot hips to EoB  Transfers Overall transfer level: Needs assistance Equipment used: Rolling walker (2 wheeled) Transfers: Sit to/from Stand Sit to Stand: Min assist         General transfer comment: pt performed 4x  sit<>stand with RW and minA for steadying while pericare performed and gown changed   Ambulation/Gait Ambulation/Gait assistance: Min assist;Mod assist;+2 physical assistance(4 episodes of moderate assist for LE buckling/weakness noted) Ambulation Distance (Feet): 100 Feet Assistive device: Rolling walker (2 wheeled) Gait Pattern/deviations: Step-to pattern;Decreased stride length;Shuffle Gait velocity: decreased Gait velocity interpretation: Below normal speed for age/gender General Gait Details: minA for steadying with gait, vc for proximity to RW, hand placement on walker and uprighr posture   Stairs Stairs: (not safe to attempt)           Balance Overall balance assessment: Needs assistance Sitting-balance support: Feet supported;No upper extremity supported Sitting balance-Leahy Scale: Poor Sitting balance - Comments: Pt required min - mod A with Lt lateral and anterior lean    Standing balance support: Single extremity supported Standing balance-Leahy Scale: Poor Standing balance comment: requires hands on physical assist at all times                             Cognition Arousal/Alertness: Awake/alert;Lethargic Behavior During Therapy: Flat affect Overall Cognitive Status: Impaired/Different from baseline                                 General Comments: Pt with h/o Alzheimer's, but spouse reports pt is not at baseline.  She will follow simple one step commands with cuing.  She has been more lethargic than normal, and limited interaction          General  Comments General comments (skin integrity, edema, etc.): SaO2 90%O2 on RA at entry, with activity SaO2 dropped to 83%, supplemental O2 provided via nasal cannula and SaO2 rebounded to 91%O2, pt able to maintain 90%O2 throughout ambulation        Pertinent Vitals/Pain Pain Assessment: Faces Faces Pain Scale: Hurts little more Pain Location: unable to state Pain Intervention(s): Limited  activity within patient's tolerance;Monitored during session;Repositioned           PT Goals (current goals can now be found in the care plan section) Acute Rehab PT Goals Patient Stated Goal: to go home PT Goal Formulation: With patient/family Time For Goal Achievement: 01/12/17 Potential to Achieve Goals: Good Progress towards PT goals: Progressing toward goals    Frequency    Min 3X/week      PT Plan Discharge plan needs to be updated       AM-PAC PT "6 Clicks" Daily Activity  Outcome Measure  Difficulty turning over in bed (including adjusting bedclothes, sheets and blankets)?: Unable Difficulty moving from lying on back to sitting on the side of the bed? : Unable Difficulty sitting down on and standing up from a chair with arms (e.g., wheelchair, bedside commode, etc,.)?: Unable Help needed moving to and from a bed to chair (including a wheelchair)?: A Lot Help needed walking in hospital room?: A Lot Help needed climbing 3-5 steps with a railing? : A Lot 6 Click Score: 9    End of Session Equipment Utilized During Treatment: Gait belt Activity Tolerance: Patient tolerated treatment well Patient left: in bed;with call bell/phone within reach;with bed alarm set;with family/visitor present Nurse Communication: Mobility status PT Visit Diagnosis: Difficulty in walking, not elsewhere classified (R26.2);Muscle weakness (generalized) (M62.81)     Time: 8299-3716 PT Time Calculation (min) (ACUTE ONLY): 29 min  Charges:  $Gait Training: 8-22 mins $Therapeutic Activity: 8-22 mins                    G Codes:       Christia Domke B. Migdalia Dk PT, DPT Acute Rehabilitation  [phone] Pager [phone]     [place] 01/01/2017, 2:41 PM

## 2017-01-01 NOTE — Progress Notes (Signed)
PROGRESS NOTE  Lisa Mclaughlin UXL:[id number] DOB: September 06, 1950 DOA: 12/26/2016 PCP: Tonia Ghent, MD  HPI/Recap of past 24 hours: 66 yr old female with hx of severe dementia, bipolar, hiatal hernia-admit 12/25 with seizures and intubated-loaded with Fosphenytoin and Keppra and admit by CCM. Pt was subsequently extubated and transferred to [facility] hospital[facility] for further management.  Today, pt didn't appear to be in distress. Pt unable to communicate, with inappropriate answers. Husband present at bedside, normal for patient. Reported pt has generalized weakness.    Assessment/Plan: Principal Problem:   Status epilepticus ([place]) Active Problems:   Vitamin D deficiency   Depression with anxiety   Dementia in Alzheimer's disease   Agitation  #Status Epilepticus Resolved Avoiding wellbutrin/abilify Resuming citalopram and ativan as per neuro [facility] need [facility]  #Acute hypoxic resp failure Plan to wean off Currently on oxygen Risk of Aspiration PNa--no overt evidence-holding off of Abx, Dys 3 diet  #Mod-severe dementia Cont Namenda 10, aricept 10  #HTN continue coreg 3.125, clonidine 0.1 bid, Imdur 30 od today  #Hypokalemia start kdur 40 daily  #Bipolar see above meds changes--would not use meds which lower Sz threshold    Code Status: Full  Family Communication: Discussed with husband at bedside  Disposition Plan: SNF Vs CIR   Consultants:  Neurology   Procedures:  None  Antimicrobials:  None  DVT prophylaxis:  Lovenox   Objective: Vitals:   01/01/17 0113 01/01/17 0637 01/01/17 0832 01/01/17 1300  BP: (!) 160/81 (!) 167/87 (!) 166/72 (!) 156/65  Pulse: 91 85 83 88  Resp: 18  20 18   Temp: 98.2 F (36.8 C) (!) 97.5 F (36.4 C) 98.8 F (37.1 C) 98.6 F (37 C)  TempSrc: Oral Oral Oral Oral  SpO2: 98% 95% 95% 98%  Weight: 75.9 kg (167 lb 5.3 oz)     Height:        Intake/Output Summary (Last 24 hours) at 01/01/2017  1500 Last data filed at 01/01/2017 1232 Gross per 24 hour  Intake 120 ml  Output -  Net 120 ml   Filed Weights   12/30/16 0556 12/31/16 0500 01/01/17 0113  Weight: 73.5 kg (162 lb 0.6 oz) 73 kg (160 lb 15 oz) 75.9 kg (167 lb 5.3 oz)    Exam:   General: Awake, alert, confused, inappropriate answers to questions  Cardiovascular: S1-S2 present, no added heart sounds  Respiratory: Chest clear bilaterally  Abdomen: Soft, nontender, nondistended, bowel sounds present  Musculoskeletal: No pedal edema  Skin: Normal  Psychiatry: Unable to assess  Neuro: Minimally follows commands, unable to fully assess   Data Reviewed: CBC: Recent Labs  Lab 12/26/16 1933 12/27/16 0245 12/28/16 0426 12/29/16 0426 12/30/16 0422  WBC 20.7* 18.6* 13.6* 13.8* 10.7*  NEUTROABS  --  15.7* 11.0*  --   --   HGB 15.2* 15.8* 14.3 14.5 14.3  HCT 44.5 45.8 42.2 43.0 41.8  MCV 94.5 94.8 94.8 95.3 93.9  PLT 294 274 231 247 536   Basic Metabolic Panel: Recent Labs  Lab 12/26/16 1041 12/26/16 1933 12/27/16 0903 12/28/16 0426 12/29/16 0426 12/30/16 0422  NA 136  --  138 136 135 136  K 3.9  --  3.4* 3.1* 4.1 3.1*  CL 104  --  102 102 103 102  CO2 22  --  23 24 20* 25  GLUCOSE 117*  --  140* 125* 116* 116*  BUN 10  --  8 6 <5* 10  CREATININE 0.90 0.90  0.71 0.62 0.61 0.69  CALCIUM 8.9  --  8.3* 8.0* 8.5* 8.6*  MG  --   --  1.7 1.8 2.1 2.0  PHOS  --   --   --   --  1.5*  --    GFR: Estimated Creatinine Clearance: 67.5 mL/min (by C-G formula based on SCr of 0.69 mg/dL). Liver Function Tests: Recent Labs  Lab 12/27/16 0903 12/28/16 0426 12/30/16 0422  AST 31 24 23   ALT 29 22 29   ALKPHOS 82 80 80  BILITOT 0.9 0.8 0.7  PROT 6.4* 5.6* 6.1*  ALBUMIN 3.1* 2.7* 2.5*   No results for input(s): LIPASE, AMYLASE in the last 168 hours. No results for input(s): AMMONIA in the last 168 hours. Coagulation Profile: Recent Labs  Lab 12/27/16 0245 12/28/16 0426 12/30/16 0422  INR 1.10 1.15  1.14   Cardiac Enzymes: No results for input(s): CKTOTAL, CKMB, CKMBINDEX, TROPONINI in the last 168 hours. BNP (last 3 results) Recent Labs    01/14/16 0926  PROBNP 74.0   HbA1C: No results for input(s): HGBA1C in the last 72 hours. CBG: Recent Labs  Lab 12/26/16 1046 12/27/16 0259  GLUCAP 125* 205*   Lipid Profile: Recent Labs    01/01/17 0424  TRIG 165*   Thyroid Function Tests: No results for input(s): TSH, T4TOTAL, FREET4, T3FREE, THYROIDAB in the last 72 hours. Anemia Panel: No results for input(s): VITAMINB12, FOLATE, FERRITIN, TIBC, IRON, RETICCTPCT in the last 72 hours. Urine analysis:    Component Value Date/Time   COLORURINE YELLOW 12/27/2016 0352   APPEARANCEUR TURBID (A) 12/27/2016 0352   LABSPEC 1.024 12/27/2016 0352   PHURINE 5.0 12/27/2016 0352   GLUCOSEU >=500 (A) 12/27/2016 0352   HGBUR LARGE (A) 12/27/2016 0352   HGBUR trace-lysed 07/26/2007 1551   BILIRUBINUR NEGATIVE 12/27/2016 0352   KETONESUR NEGATIVE 12/27/2016 0352   PROTEINUR 100 (A) 12/27/2016 0352   UROBILINOGEN 0.2 07/26/2007 1551   NITRITE NEGATIVE 12/27/2016 0352   LEUKOCYTESUR TRACE (A) 12/27/2016 0352   Sepsis Labs: @LABRCNTIP (procalcitonin:4,lacticidven:4)  ) Recent Results (from the past 240 hour(s))  MRSA PCR Screening     Status: None   Collection Time: 12/26/16  7:36 PM  Result Value Ref Range Status   MRSA by PCR NEGATIVE NEGATIVE Final    Comment:        The GeneXpert MRSA Assay (FDA approved for NASAL specimens only), is one component of a comprehensive MRSA colonization surveillance program. It is not intended to diagnose MRSA infection nor to guide or monitor treatment for MRSA infections.   Culture, respiratory (NON-Expectorated)     Status: None   Collection Time: 12/28/16 11:10 AM  Result Value Ref Range Status   Specimen Description TRACHEAL ASPIRATE  Final   Special Requests NONE  Final   Gram Stain NO WBC SEEN FEW GRAM POSITIVE COCCI IN PAIRS    Final   Culture Consistent with normal respiratory flora.  Final   Report Status 12/30/2016 FINAL  Final      Studies: No results found.  Scheduled Meds: . carvedilol  3.125 mg Oral BID WC  . chlorhexidine  15 mL Mouth Rinse BID  . donepezil  10 mg Oral QHS  . enoxaparin (LOVENOX) injection  40 mg Subcutaneous Q24H  . isosorbide mononitrate  30 mg Oral Daily  . levETIRAcetam  500 mg Oral BID  . LORazepam  0.5 mg Oral Q6H  . mouth rinse  15 mL Mouth Rinse q12n4p  . memantine  10 mg Oral  Daily  . multivitamin with minerals  1 tablet Oral Daily  . potassium chloride  40 mEq Oral Daily  . thiamine  100 mg Oral Daily   Or  . thiamine  100 mg Intravenous Daily    Continuous Infusions: . sodium chloride    . sodium chloride 75 mL/hr at 12/31/16 0140     LOS: 6 days     Alma Friendly, MD [facility]   If 7PM-7AM, please contact night-coverage [email or web address] Password [email or web address] 01/01/2017, 3:00 PM

## 2017-01-01 NOTE — Consult Note (Signed)
Physical Medicine and Rehabilitation Consult   Reason for Consult: Gait disorder/hypoxia due to status epilepticus Referring Physician: Dr. Horris Latino   HPI: Lisa Mclaughlin is a 66 y.o. female with history of ADD, bipolar dementia, advanced Alzheimer's  dementia who was admitted on 12/26/16 with generalized seizures and confusion. History taken from chart review and husband. She required multiple doses of ativan as well as intubation due to status epilepticus.  Wellbutrin and [name] discontinued and MRI brain reviewed, negative for acute process.  Per report, advanced atrophy with progression since 2014. Neurology suggested seizures likely due to lowered threshold due to Wellbutrin and possible benzo withdrawal due to recent changes in ativan.  Cleared to resume Celexa and ativan qid. Currently limited by hypoxia affecting safety with mobility as well as lethargy. CIR recommended by rehab team.   Patient has caregivers during the day (husband works 40 hrs) and needed assistance with ADLs prior to admission.  They live in a second floor apartment and patient was having a hard time getting up stairs PTA. He is concerned about ability to take her for MD visits after discharge but has plans to move to ground level apartment in a month.    Review of Systems  Unable to perform ROS: Mental acuity      Past Medical History:  Diagnosis Date  . ADD (attention deficit disorder with hyperactivity)    adult  . Alzheimer's disease, early onset   . Depression   . History of anemia   . Osteopenia 2002   dexa  . Osteoporosis   . Scoliosis     Past Surgical History:  Procedure Laterality Date  . BREAST ENHANCEMENT SURGERY    . CARDIAC EVENT MONITOR  02/2016   Mostly sinus rhythm. Lowest heart rate recorded was 50 BPM. No A. fib noted. Heart rate did achieve the rate of 110 bpm. Lightheadedness and dizziness was associated with mostly sinus rhythm with occasional PVCs or mild sinus tachycardia. No  pauses greater than 3 seconds noted.   Marland Kitchen CESAREAN SECTION     x 3  . CORNARY CTA-FFR  03/31/2016   distal LAD stenosis with an FFR of 0.77. No left main, circumflex or RCA disease noted.. --> PLAN - medical management  . DILATION AND CURETTAGE OF UTERUS  2015  . NASAL SINUS SURGERY    . NM MYOVIEW LTD  06/2004   No infarct or Ischemia  . Pelvic ultrasound  05/10   thickened endometrium and ? polyp vs fibroid  . TRANSTHORACIC ECHOCARDIOGRAM  02/11/2016   Normal LV size and function. EF 60-65%. No comment on diastolic function or any significant valvular lesion.    Family History  Problem Relation Age of Onset  . Heart disease Mother        CAD  . Alzheimer's disease Mother        Died, 16  . Heart disease Father         CAD MI at age 37  . Hypertension Father   . Hypertension Sister   . Cancer Other        leukemia  . Diabetes Sister   . Cancer Other        breast  . Breast cancer Other   . Colon cancer Neg Hx     Social History:  reports that she quit smoking about 37 years ago. she has never used smokeless tobacco. She reports that she drinks alcohol. She reports that she does not use drugs.  Allergies  Allergen Reactions  . Gluten Meal Other (See Comments)    Allergic sensitivity  . Other Other (See Comments)    Allergic sensitivity-Food allergies: almond, banana, casein, cheese, cola, egg white, flaxseed, gluten, malt, cow and goat milks, mushrooms, pineapple, salmon, sesame, [place], wheat, whey, bakers and brewers yeast, yogurt.   . Tetracycline Hcl Other (See Comments)    unknown    Medications Prior to Admission  Medication Sig Dispense Refill  . ARIPiprazole ([name]) 5 MG tablet Take 0.5 tablets (2.5 mg total) by mouth daily.    Marland Kitchen aspirin 81 MG tablet Take 1 tablet (81 mg total) by mouth daily. 30 tablet 11  . buPROPion (WELLBUTRIN XL) 300 MG 24 hr tablet TAKE 1 TABLET BY MOUTH EVERY DAY 90 tablet 1  . carvedilol (COREG) 3.125 MG tablet TAKE 1  TABLET(3.125 MG) BY MOUTH TWICE DAILY 180 tablet 6  . Cholecalciferol (VITAMIN D3) 5000 units CAPS Take 5,000 Units by mouth daily.     . citalopram (CELEXA) 40 MG tablet TAKE 1 TABLET BY MOUTH EVERY DAY 90 tablet 1  . donepezil (ARICEPT) 23 MG TABS tablet Take 1 tablet (23 mg total) by mouth at bedtime. 30 tablet 11  . estrogens, conjugated, (PREMARIN) 0.9 MG tablet Take 0.9 mg by mouth daily.     . isosorbide mononitrate (IMDUR) 30 MG 24 hr tablet TAKE 1 TABLET(30 MG) BY MOUTH DAILY 30 tablet 7  . LORazepam (ATIVAN) 0.5 MG tablet Take 1-1.5 tablets (0.5-0.75 mg total) by mouth 4 (four) times daily as needed for anxiety. 150 tablet 1  . MELATONIN PO Take 8 mg by mouth at bedtime.    . memantine (NAMENDA) 10 MG tablet TAKE 1 TABLET(10 MG) BY MOUTH TWICE DAILY 60 tablet 11  . Multiple Vitamins-Minerals (HIGH POTENCY MULTIVIT/MIN/IRON) TABS Take 1 tablet by mouth daily.     . nitroGLYCERIN (NITROSTAT) 0.4 MG SL tablet Place 1 tablet (0.4 mg total) under the tongue every 5 (five) minutes as needed for chest pain. 30 tablet 6  . progesterone (PROMETRIUM) 200 MG capsule Take 1 capsule by mouth at bedtime.  12    Home: Home Living Family/patient expects to be discharged to:: Private residence Living Arrangements: Spouse/significant other(has hired caregivers as well) Available Help at Discharge: Family, Available 24 hours/day Type of Home: Apartment Home Access: Stairs to enter Technical brewer of Steps: flight Entrance Stairs-Rails: Right Home Layout: One level Bathroom Shower/Tub: Chiropodist: [place]: (grab bar) Additional Comments: Pt has hired caregivers 7:30-5:30 M-Th.  Spouse is with pt the remainder of the time   Functional History: Prior Function Level of Independence: Needs assistance Gait / Transfers Assistance Needed: Pt was mod I with ambualation without AD  ADL's / Homemaking Assistance Needed: Pt required assist with bathing,  dressing, toileting.  She was able to feed self and brush teeth with direct supervision  Communication / Swallowing Assistance Needed: limited communication, but able to hold limited conversations with spouse  Comments: Pt accompanied spouse to grocery store and on community outings  Functional Status:  Mobility: Bed Mobility Overal bed mobility: Needs Assistance Bed Mobility: Supine to Sit Supine to sit: Min assist General bed mobility comments: minA for trunk to upright, pt able to move LE off bed and scoot hips to EoB Transfers Overall transfer level: Needs assistance Equipment used: Rolling walker (2 wheeled) Transfers: Sit to/from Stand Sit to Stand: Min assist Stand pivot transfers: Min assist General transfer comment: pt performed 4x sit<>stand with  RW and minA for steadying while pericare performed and gown changed  Ambulation/Gait Ambulation/Gait assistance: Min assist, Mod assist, +2 physical assistance(4 episodes of moderate assist for LE buckling/weakness noted) Ambulation Distance (Feet): 100 Feet Assistive device: Rolling walker (2 wheeled) Gait Pattern/deviations: Step-to pattern, Decreased stride length, Shuffle General Gait Details: minA for steadying with gait, vc for proximity to RW, hand placement on walker and uprighr posture Gait velocity: decreased Gait velocity interpretation: Below normal speed for age/gender Stairs: (not safe to attempt)    ADL: ADL Overall ADL's : Needs assistance/impaired Eating/Feeding: Maximal assistance, Bed level Eating/Feeding Details (indicate cue type and reason): Pt drank from cup independently  Grooming: Wash/dry hands, Wash/dry face, Brushing hair, Oral care, Total assistance, Sitting Upper Body Bathing: Total assistance Lower Body Bathing: Total assistance, Sit to/from stand Upper Body Dressing : Total assistance, Sitting Lower Body Dressing: Total assistance, Sit to/from stand Toilet Transfer: Maximal assistance, +2 for  safety/equipment Toileting- Clothing Manipulation and Hygiene: Total assistance, Sit to/from stand Toileting - Clothing Manipulation Details (indicate cue type and reason): Pt incontinent of urine.  Assisted with peri care  Functional mobility during ADLs: Minimal assistance, +2 for safety/equipment  Cognition: Cognition Overall Cognitive Status: Impaired/Different from baseline Orientation Level: Oriented to person Cognition Arousal/Alertness: Awake/alert, Lethargic Behavior During Therapy: Flat affect Overall Cognitive Status: Impaired/Different from baseline General Comments: Pt with h/o Alzheimer's, but spouse reports pt is not at baseline.  She will follow simple one step commands with cuing.  She has been more lethargic than normal, and limited interaction    Blood pressure (!) 156/65, pulse 88, temperature 98.6 F (37 C), temperature source Oral, resp. rate 18, height 5\' 3"  (1.6 m), weight 75.9 kg (167 lb 5.3 oz), SpO2 98 %. Physical Exam  Nursing note and vitals reviewed. Constitutional: She appears well-developed and well-nourished. No distress.  HENT:  Head: Normocephalic and atraumatic.  Mouth/Throat: Oropharynx is clear and moist.  Eyes: Conjunctivae and EOM are normal. Pupils are equal, round, and reactive to light. Right eye exhibits no discharge. Left eye exhibits no discharge.  Neck: Normal range of motion. Neck supple.  Cardiovascular: Normal rate and regular rhythm.  Respiratory: Effort normal and breath sounds normal. No stridor. No respiratory distress. She has no wheezes.  +[place]  GI: Soft. Bowel sounds are normal. She exhibits no distension. There is no tenderness.  Musculoskeletal: She exhibits no edema or tenderness.  Neurological: She is alert.  Global, expressive>receptive aphasia Restless with multiple attempts to get up during exam needing redirection.  Unable to respond to basic questions.  Did make eye contact briefly.   Motor: Limited by participation,  but moving all 4s  Skin: Skin is warm and dry. She is not diaphoretic.  Psychiatric: Her affect is blunt. Cognition and memory are impaired. She expresses impulsivity. She is noncommunicative. She is inattentive.    Results for orders placed or performed during the hospital encounter of 12/26/16 (from the past 24 hour(s))  Triglycerides     Status: Abnormal   Collection Time: 01/01/17  4:24 AM  Result Value Ref Range   Triglycerides 165 (H) <150 mg/dL   No results found.  Assessment/Plan: Diagnosis: Debility Labs independently reviewed.  Records reviewed and summated above.  1. Does the need for close, 24 hr/day medical supervision in concert with the patient's rehab needs make it unreasonable for this patient to be served in a less intensive setting? Potentially  2. Co-Morbidities requiring supervision/potential complications: ADD (cont meds), bipolar dementia (cont meds), advanced Alzheimer's  dementia (cont meds), seizures (cont meds), elevated blood pressure (monitor and provide prns in accordance with increased physical exertion and pain), leukocytosis (cont to monitor for signs and symptoms of infection, further workup if indicated), hypokalemia (continue to monitor and replete as necessary), supplemental oxygen dependent (wean as tolerated) 3. Due to bladder management, bowel management, safety, skin/wound care, disease management, medication administration and patient education, does the patient require 24 hr/day rehab nursing? Potentially 4. Does the patient require coordinated care of a physician, rehab nurse, PT (1-2 hrs/day, 5 days/week), OT (1-2 hrs/day, 5 days/week) and SLP (1-2 hrs/day, 5 days/week) to address physical and functional deficits in the context of the above medical diagnosis(es)? Yes Addressing deficits in the following areas: balance, endurance, locomotion, strength, transferring, bowel/bladder control, bathing, dressing, feeding, grooming, toileting, cognition,  speech, language, swallowing and psychosocial support 5. Can the patient actively participate in an intensive therapy program of at least 3 hrs of therapy per day at least 5 days per week? Yes 6. The potential for patient to make measurable gains while on inpatient rehab is fair 7. Anticipated functional outcomes upon discharge from inpatient rehab are min assist  with PT, min assist with OT, mod assist with SLP. 8. Estimated rehab length of stay to reach the above functional goals is: 22-27 days. 9. Anticipated D/C setting: Home 10. Anticipated post D/C treatments: HH therapy and Home excercise program 11. Overall Rehab/Functional Prognosis: fair  RECOMMENDATIONS: This patient's condition is appropriate for continued rehabilitative care in the following setting: Pt appears to be at or near her baseline level of functioning. Recommend home with [facility] or SNF if husband feels he can no longer provide adequate support. Patient has agreed to participate in recommended program. Potentially Note that insurance prior authorization may be required for reimbursement for recommended care.  Comment: Rehab Admissions Coordinator to follow up.  Lisa Lesch, MD, ABPMR Lisa Mclaughlin, [place] 01/01/2017

## 2017-01-01 NOTE — Telephone Encounter (Signed)
Currently inpatient, I'll defer to inpatient team.

## 2017-01-01 NOTE — NC FL2 (Signed)
[place] LEVEL OF CARE SCREENING TOOL     IDENTIFICATION  Patient Name: Lisa Mclaughlin Birthdate: [birth year] Sex: female Admission Date (Current Location): 12/26/2016  [facility] and [place] Number:  Herbalist and Address:  [facility], [place] [address], [place], [place] [address]      Provider Number: [id number]  Attending Physician Name and Address:  Alma Friendly, MD  Relative Name and Phone Number:       Current Level of Care: Hospital Recommended Level of Care: [place] Prior Approval Number:    Date Approved/Denied:   PASRR Number: Manual review  Discharge Plan: SNF    Current Diagnoses: Patient Active Problem List   Diagnosis Date Noted  . Status epilepticus ([place]) 12/26/2016  . Altered mental state   . Rash 12/17/2016  . Cough 05/22/2016  . Coronary artery disease involving native coronary artery of native heart with angina pectoris ([place]) 04/14/2016  . Dyspnea on exertion 01/28/2016  . Bradycardia 01/28/2016  . Colon cancer screening 01/17/2016  . Medicare welcome visit 11/20/2015  . Advance care planning 11/20/2015  . SOB (shortness of breath) on exertion 11/20/2015  . Agitation 10/16/2014  . Edema leg 07/09/2014  . Back pain 11/14/2012  . Hot flashes 05/24/2012  . Orthostasis 11/24/2011  . Dementia in Alzheimer's disease 09/20/2011  . IBS (irritable bowel syndrome) 01/04/2011  . Vitamin D deficiency 10/04/2009  . FATIGUE 03/15/2009  . POSTMENOPAUSAL BLEEDING 05/05/2008  . HYPERLIPIDEMIA 10/14/2007  . SCOLIOSIS 10/14/2007  . Depression with anxiety 07/11/2007  . OSTEOPOROSIS 07/11/2007    Orientation RESPIRATION BLADDER Height & Weight     Self  O2([place] 3L) Incontinent Weight: 167 lb 5.3 oz (75.9 kg) Height:  5\' 3"  (160 cm)  BEHAVIORAL SYMPTOMS/MOOD NEUROLOGICAL BOWEL NUTRITION STATUS    Convulsions/Seizures Incontinent Diet(mechanical soft)  AMBULATORY STATUS COMMUNICATION OF NEEDS  Skin   Extensive Assist Verbally Normal                       Personal Care Assistance Level of Assistance  Bathing, Feeding, Dressing Bathing Assistance: Maximum assistance Feeding assistance: Limited assistance Dressing Assistance: Maximum assistance     Functional Limitations Info  Sight, Hearing, Speech Sight Info: Adequate Hearing Info: Adequate Speech Info: Adequate    SPECIAL CARE FACTORS FREQUENCY  PT (By licensed PT), OT (By licensed OT)     PT Frequency: 5x/wk OT Frequency: 5x/wk            Contractures Contractures Info: Not present    Additional Factors Info  Code Status, Allergies, Psychotropic Code Status Info: Full Allergies Info: Gluten Meal, Other, Tetracycline Hcl Psychotropic Info: Ativan 0.5 mg every 6 hours         Current Medications (01/01/2017):  This is the current hospital active medication list Current Facility-Administered Medications  Medication Dose Route Frequency Provider Last Rate Last Dose  . 0.9 %  sodium chloride infusion  250 mL Intravenous PRN Etheleen Nicks, MD      . 0.9 %  sodium chloride infusion   Intravenous Continuous Deterding, Guadelupe Sabin, MD 75 mL/hr at 12/31/16 0140    . acetaminophen (TYLENOL) suppository 650 mg  650 mg Rectal Q6H PRN Greta Doom, MD   650 mg at 12/30/16 0844  . carvedilol (COREG) tablet 3.125 mg  3.125 mg Oral BID WC Etheleen Nicks, MD   3.125 mg at 01/01/17 0959  . chlorhexidine (PERIDEX) 0.12 % solution  15 mL  15 mL Mouth Rinse BID Amie Portland, MD   15 mL at 01/01/17 [phone]  . cloNIDine (CATAPRES) tablet 0.1 mg  0.1 mg Oral BID PRN Etheleen Nicks, MD      . donepezil (ARICEPT) tablet 10 mg  10 mg Oral QHS Amie Portland, MD   10 mg at 12/31/16 2317  . enoxaparin (LOVENOX) injection 40 mg  40 mg Subcutaneous Q24H Etheleen Nicks, MD   40 mg at 12/31/16 1712  . isosorbide mononitrate (IMDUR) 24 hr tablet 30 mg  30 mg Oral Daily Etheleen Nicks, MD   30 mg at 01/01/17 4259  .  levETIRAcetam (KEPPRA) tablet 500 mg  500 mg Oral BID Etheleen Nicks, MD   500 mg at 01/01/17 5638  . LORazepam (ATIVAN) tablet 0.5 mg  0.5 mg Oral Q6H Amie Portland, MD   0.5 mg at 01/01/17 0958  . MEDLINE mouth rinse  15 mL Mouth Rinse q12n4p Amie Portland, MD   15 mL at 12/31/16 1731  . memantine (NAMENDA) tablet 10 mg  10 mg Oral Daily Amie Portland, MD   10 mg at 12/31/16 1045  . multivitamin with minerals tablet 1 tablet  1 tablet Oral Daily Etheleen Nicks, MD   1 tablet at 01/01/17 [phone]  . potassium chloride SA (K-DUR,KLOR-CON) CR tablet 40 mEq  40 mEq Oral Daily Nita Sells, MD   40 mEq at 01/01/17 0959  . thiamine (VITAMIN B-1) tablet 100 mg  100 mg Oral Daily Julianne Rice, MD   100 mg at 01/01/17 3329   Or  . thiamine (B-1) injection 100 mg  100 mg Intravenous Daily Julianne Rice, MD   100 mg at 12/28/16 1059     Discharge Medications: Please see discharge summary for a list of discharge medications.  Relevant Imaging Results:  Relevant Lab Results:   Additional Information SS#: [id number]  Geralynn Ochs, LCSW

## 2017-01-01 NOTE — Care Management Important Message (Signed)
Important Message  Patient Details  Name: Lisa Mclaughlin MRN: [id number] Date of Birth: Jan 02, 1951   Medicare Important Message Given:  Yes    Lisa Mclaughlin 01/01/2017, 1:52 PM

## 2017-01-02 ENCOUNTER — Other Ambulatory Visit: Payer: Self-pay

## 2017-01-02 LAB — CBC WITH DIFFERENTIAL/PLATELET
Basophils Absolute: 0 10*3/uL (ref 0.0–0.1)
Basophils Relative: 0 %
EOS ABS: 0.2 10*3/uL (ref 0.0–0.7)
Eosinophils Relative: 3 %
HEMATOCRIT: 38.6 % (ref 36.0–46.0)
HEMOGLOBIN: 12.9 g/dL (ref 12.0–15.0)
Lymphocytes Relative: 23 %
Lymphs Abs: 1.8 10*3/uL (ref 0.7–4.0)
MCH: 31.2 pg (ref 26.0–34.0)
MCHC: 33.4 g/dL (ref 30.0–36.0)
MCV: 93.5 fL (ref 78.0–100.0)
Monocytes Absolute: 0.8 10*3/uL (ref 0.1–1.0)
Monocytes Relative: 10 %
NEUTROS ABS: 4.9 10*3/uL (ref 1.7–7.7)
NEUTROS PCT: 64 %
Platelets: 299 10*3/uL (ref 150–400)
RBC: 4.13 MIL/uL (ref 3.87–5.11)
RDW: 13.2 % (ref 11.5–15.5)
WBC: 7.8 10*3/uL (ref 4.0–10.5)

## 2017-01-02 LAB — BASIC METABOLIC PANEL
Anion gap: 7 (ref 5–15)
BUN: 5 mg/dL — AB (ref 6–20)
CHLORIDE: 106 mmol/L (ref 101–111)
CO2: 25 mmol/L (ref 22–32)
Calcium: 8.4 mg/dL — ABNORMAL LOW (ref 8.9–10.3)
Creatinine, Ser: 0.55 mg/dL (ref 0.44–1.00)
GFR calc non Af Amer: >60 mL/min (ref >60)
Glucose, Bld: 98 mg/dL (ref 65–99)
POTASSIUM: 2.9 mmol/L — AB (ref 3.5–5.1)
SODIUM: 138 mmol/L (ref 135–145)

## 2017-01-02 MED ORDER — WHITE PETROLATUM EX OINT
TOPICAL_OINTMENT | CUTANEOUS | Status: AC
  Administered 2017-01-02: 17:00:00
  Filled 2017-01-02: qty 28.35

## 2017-01-02 MED ORDER — POTASSIUM CHLORIDE CRYS ER 20 MEQ PO TBCR
40.0000 meq | EXTENDED_RELEASE_TABLET | Freq: Two times a day (BID) | ORAL | Status: DC
  Administered 2017-01-02 – 2017-01-03 (×3): 40 meq via ORAL
  Filled 2017-01-02 (×3): qty 2

## 2017-01-02 NOTE — Progress Notes (Signed)
Patient on CIWA protocol. Unable to answer some of the questions. Will continue to check on patient.

## 2017-01-02 NOTE — Progress Notes (Signed)
Occupational Therapy Treatment Patient Details Name: Galilee Pierron MRN: [id number] DOB: October 27, 1950 Today's Date: 01/02/2017    History of present illness Patient is 67 year old Caucasian female with past medical history of severe dementia with depression also history of agitation benzodiazepine dependent has been sleep for the past 2 days get down on the benzo happened 12/25 when she was about to go to the shower patient had witnessed seizure by her husband no evidence of head trauma no witnessed head trauma patient continued to seize.     OT comments  Pt is progressing, but is NOT at her baseline level of functioning.  She is more animated and engaged more in conversation today, and assisted with pulling pants up and removing gown today.  She continues to require min A +2 for functional mobility, and ambulated ~100'.  She again, stopped, with glazed appearance, mild dyspnea noted.  She is able to indicate that she doesn't feel good, but is unable to elaborate on anything other than she needed a rest break.  HR 89; 02 sats 90-96% on RA.  RN notified.  Pt returned to room.   Spouse is very supportive.  Feel she would benefit from the structure and intensity of CIR for short term rehab to reduce burden of care and allow pt to improve adequately for spouse to assist her at home.  Will continue to follow.   Follow Up Recommendations  CIR    Equipment Recommendations  3 in 1 bedside commode    Recommendations for Other Services      Precautions / Restrictions Precautions Precautions: Fall Precaution Comments: Alzheimer's        Mobility Bed Mobility Overal bed mobility: Needs Assistance Bed Mobility: Supine to Sit     Supine to sit: Mod assist     General bed mobility comments: mod A to initiate activity   Transfers Overall transfer level: Needs assistance Equipment used: 1 person hand held assist Transfers: Sit to/from [facility] Sit to Stand: Min assist Stand pivot  transfers: Min assist       General transfer comment: HHA to steady     Balance Overall balance assessment: Needs assistance Sitting-balance support: Feet supported;No upper extremity supported Sitting balance-Leahy Scale: Fair     Standing balance support: Single extremity supported Standing balance-Leahy Scale: Poor Standing balance comment: requires UE support                            ADL either performed or assessed with clinical judgement   ADL                           Toilet Transfer: Minimal assistance;Ambulation;Comfort height toilet;Grab bars   Toileting- Clothing Manipulation and Hygiene: Maximal assistance;Sit to/from stand Toileting - Clothing Manipulation Details (indicate cue type and reason): Pt incontinent of urine.  She spontaneously assisted with pulling up pants      Functional mobility during ADLs: Minimal assistance;+2 for safety/equipment       Vision       Perception     Praxis      Cognition Arousal/Alertness: Awake/alert;Lethargic Behavior During Therapy: WFL for tasks assessed/performed;Flat affect Overall Cognitive Status: Impaired/Different from baseline                                 General Comments: Pt with h/o Alzheimer's.  Pt  more antimated today, and will occasionally initiate questions/comments.  Spouse reports she is getting closer to her baseline         Exercises     Shoulder Instructions       General Comments 02 sat 96% at rest on RA, 90% at end of session on RA.  However, pt was ambulating in hallway, and stopped had blank stare, mild dyspnea.  She indicates she doesn't feel okay, but is unable to elaborate. HR 89.  RN notified.     Pertinent Vitals/ Pain       Pain Assessment: Faces Faces Pain Scale: Hurts a little bit Pain Location: unable to state Pain Descriptors / Indicators: Grimacing Pain Intervention(s): Monitored during session  Home Living                                           Prior Functioning/Environment              Frequency  Min 2X/week        Progress Toward Goals  OT Goals(current goals can now be found in the care plan section)  Progress towards OT goals: Progressing toward goals     Plan Discharge plan remains appropriate    Co-evaluation                 AM-PAC PT "6 Clicks" Daily Activity     Outcome Measure   Help from another person eating meals?: A Lot Help from another person taking care of personal grooming?: A Lot Help from another person toileting, which includes using toliet, bedpan, or urinal?: A Lot Help from another person bathing (including washing, rinsing, drying)?: Total Help from another person to put on and taking off regular upper body clothing?: Total Help from another person to put on and taking off regular lower body clothing?: Total 6 Click Score: 9    End of Session    OT Visit Diagnosis: Unsteadiness on feet (R26.81);Cognitive communication deficit (R41.841)   Activity Tolerance Patient limited by fatigue   Patient Left in chair;with call bell/phone within reach;with chair alarm set;with family/visitor present   Nurse Communication Mobility status        Time: 1455-1540 OT Time Calculation (min): 45 min  Charges: OT General Charges $OT Visit: 1 Visit OT Treatments $Self Care/Home Management : 8-22 mins $Therapeutic Activity: 38-52 mins  [facility], OTR/L [phone]    Lucille Passy M 01/02/2017, 4:06 PM

## 2017-01-02 NOTE — Progress Notes (Signed)
PROGRESS NOTE  Lisa Mclaughlin XIH:[id number] DOB: [birth year] DOA: 12/26/2016 PCP: Tonia Ghent, MD  HPI/Recap of past 24 hours: 67 yr old female with hx of severe dementia, bipolar, hiatal hernia-admit 12/25 with seizures and intubated-loaded with Fosphenytoin and Keppra and admit by CCM. Pt was subsequently extubated and transferred to [facility] for further management.  Overnight pt noted to be agitated for a short while, was calmed by lorazepam.Today, pt didn't appear to be in distress. Pt unable to communicate, with inappropriate answers. Husband present at bedside, normal for patient.    Assessment/Plan: Principal Problem:   Status epilepticus ([place]) Active Problems:   Vitamin D deficiency   Depression with anxiety   Dementia in Alzheimer's disease   Agitation   Acute respiratory failure (HCC)   Attention deficit disorder   Bipolar affective disorder (HCC)   Seizures (HCC)   Elevated blood pressure reading   Leukocytosis   Hypokalemia   Supplemental oxygen dependent  #Status Epilepticus Resolved Avoiding wellbutrin/abilify Resuming citalopram and ativan as per neuro [facility] need [facility]  #Acute hypoxic resp failure Resolved Currently not on oxygen, sat well Risk of Aspiration PNa--no overt evidence-holding off of Abx, Dys 3 diet  #Mod-severe dementia Cont Namenda 10, aricept 10  #HTN continue coreg 3.125, clonidine 0.1 bid, Imdur 30 od today  #Hypokalemia start kdur 40 BID  #Bipolar see above meds changes--would not use meds which lower Sz threshold    Code Status: Full  Family Communication: Discussed with husband at bedside  Disposition Plan: SNF Vs CIR   Consultants:  Neurology   Procedures:  None  Antimicrobials:  None  DVT prophylaxis:  Lovenox   Objective: Vitals:   01/01/17 1833 01/01/17 2232 01/02/17 0505 01/02/17 0902  BP: 134/63 133/79 (!) 147/76 (!) 146/75  Pulse: 80 84 78   Resp: 20 18  18 18   Temp: 97.7 F (36.5 C) 97.6 F (36.4 C) 97.8 F (36.6 C) 98.1 F (36.7 C)  TempSrc: Axillary Oral Oral Oral  SpO2: 96% 96% 90% 93%  Weight:   73.2 kg (161 lb 6 oz)   Height:        Intake/Output Summary (Last 24 hours) at 01/02/2017 1212 Last data filed at 01/02/2017 1117 Gross per 24 hour  Intake 540 ml  Output -  Net 540 ml   Filed Weights   12/31/16 0500 01/01/17 0113 01/02/17 0505  Weight: 73 kg (160 lb 15 oz) 75.9 kg (167 lb 5.3 oz) 73.2 kg (161 lb 6 oz)    Exam:   General: Awake, alert, confused, inappropriate answers to questions  Cardiovascular: S1-S2 present, no added heart sounds  Respiratory: Chest clear bilaterally  Abdomen: Soft, nontender, nondistended, bowel sounds present  Musculoskeletal: No pedal edema  Skin: Normal  Psychiatry: Unable to assess  Neuro: Minimally follows commands, unable to fully assess   Data Reviewed: CBC: Recent Labs  Lab 12/27/16 0245 12/28/16 0426 12/29/16 0426 12/30/16 0422 01/02/17 0537  WBC 18.6* 13.6* 13.8* 10.7* 7.8  NEUTROABS 15.7* 11.0*  --   --  4.9  HGB 15.8* 14.3 14.5 14.3 12.9  HCT 45.8 42.2 43.0 41.8 38.6  MCV 94.8 94.8 95.3 93.9 93.5  PLT 274 231 247 282 349   Basic Metabolic Panel: Recent Labs  Lab 12/27/16 0903 12/28/16 0426 12/29/16 0426 12/30/16 0422 01/02/17 0537  NA 138 136 135 136 138  K 3.4* 3.1* 4.1 3.1* 2.9*  CL 102 102 103 102 106  CO2 23 24  20* 25 25  GLUCOSE 140* 125* 116* 116* 98  BUN 8 6 <5* 10 5*  CREATININE 0.71 0.62 0.61 0.69 0.55  CALCIUM 8.3* 8.0* 8.5* 8.6* 8.4*  MG 1.7 1.8 2.1 2.0  --   PHOS  --   --  1.5*  --   --    GFR: Estimated Creatinine Clearance: 66.3 mL/min (by C-G formula based on SCr of 0.55 mg/dL). Liver Function Tests: Recent Labs  Lab 12/27/16 0903 12/28/16 0426 12/30/16 0422  AST 31 24 23   ALT 29 22 29   ALKPHOS 82 80 80  BILITOT 0.9 0.8 0.7  PROT 6.4* 5.6* 6.1*  ALBUMIN 3.1* 2.7* 2.5*   No results for input(s): LIPASE, AMYLASE in the  last 168 hours. No results for input(s): AMMONIA in the last 168 hours. Coagulation Profile: Recent Labs  Lab 12/27/16 0245 12/28/16 0426 12/30/16 0422  INR 1.10 1.15 1.14   Cardiac Enzymes: No results for input(s): CKTOTAL, CKMB, CKMBINDEX, TROPONINI in the last 168 hours. BNP (last 3 results) Recent Labs    01/14/16 0926  PROBNP 74.0   HbA1C: No results for input(s): HGBA1C in the last 72 hours. CBG: Recent Labs  Lab 12/27/16 0259  GLUCAP 205*   Lipid Profile: Recent Labs    01/01/17 0424  TRIG 165*   Thyroid Function Tests: No results for input(s): TSH, T4TOTAL, FREET4, T3FREE, THYROIDAB in the last 72 hours. Anemia Panel: No results for input(s): VITAMINB12, FOLATE, FERRITIN, TIBC, IRON, RETICCTPCT in the last 72 hours. Urine analysis:    Component Value Date/Time   COLORURINE YELLOW 12/27/2016 0352   APPEARANCEUR TURBID (A) 12/27/2016 0352   LABSPEC 1.024 12/27/2016 0352   PHURINE 5.0 12/27/2016 0352   GLUCOSEU >=500 (A) 12/27/2016 0352   HGBUR LARGE (A) 12/27/2016 0352   HGBUR trace-lysed 07/26/2007 1551   BILIRUBINUR NEGATIVE 12/27/2016 0352   KETONESUR NEGATIVE 12/27/2016 0352   PROTEINUR 100 (A) 12/27/2016 0352   UROBILINOGEN 0.2 07/26/2007 1551   NITRITE NEGATIVE 12/27/2016 0352   LEUKOCYTESUR TRACE (A) 12/27/2016 0352   Sepsis Labs: @LABRCNTIP (procalcitonin:4,lacticidven:4)  ) Recent Results (from the past 240 hour(s))  MRSA PCR Screening     Status: None   Collection Time: 12/26/16  7:36 PM  Result Value Ref Range Status   MRSA by PCR NEGATIVE NEGATIVE Final    Comment:        The GeneXpert MRSA Assay (FDA approved for NASAL specimens only), is one component of a comprehensive MRSA colonization surveillance program. It is not intended to diagnose MRSA infection nor to guide or monitor treatment for MRSA infections.   Culture, respiratory (NON-Expectorated)     Status: None   Collection Time: 12/28/16 11:10 AM  Result Value Ref  Range Status   Specimen Description TRACHEAL ASPIRATE  Final   Special Requests NONE  Final   Gram Stain NO WBC SEEN FEW GRAM POSITIVE COCCI IN PAIRS   Final   Culture Consistent with normal respiratory flora.  Final   Report Status 12/30/2016 FINAL  Final      Studies: No results found.  Scheduled Meds: . carvedilol  3.125 mg Oral BID WC  . chlorhexidine  15 mL Mouth Rinse BID  . donepezil  10 mg Oral QHS  . enoxaparin (LOVENOX) injection  40 mg Subcutaneous Q24H  . isosorbide mononitrate  30 mg Oral Daily  . levETIRAcetam  500 mg Oral BID  . LORazepam  0.5 mg Oral Q6H  . mouth rinse  15 mL Mouth Rinse  q12n4p  . memantine  10 mg Oral Daily  . multivitamin with minerals  1 tablet Oral Daily  . potassium chloride  40 mEq Oral BID  . thiamine  100 mg Oral Daily   Or  . thiamine  100 mg Intravenous Daily    Continuous Infusions: . sodium chloride       LOS: 7 days     Alma Friendly, MD [facility]   If 7PM-7AM, please contact night-coverage [email or web address] Password [email or web address] 01/02/2017, 12:12 PM

## 2017-01-02 NOTE — Progress Notes (Signed)
Suction and yankauer set up at bedside.

## 2017-01-02 NOTE — Progress Notes (Signed)
Received report on patient. Husband at bedside. Patient does not follow commands, speech is scattered. Sometimes she is appropriate, however most of the time, unable to understand her. Bed alarm on. Safety maintained.

## 2017-01-03 ENCOUNTER — Inpatient Hospital Stay (HOSPITAL_COMMUNITY): Payer: Medicare Other

## 2017-01-03 LAB — BASIC METABOLIC PANEL
ANION GAP: 11 (ref 5–15)
BUN: 8 mg/dL (ref 6–20)
CALCIUM: 9.1 mg/dL (ref 8.9–10.3)
CO2: 24 mmol/L (ref 22–32)
Chloride: 102 mmol/L (ref 101–111)
Creatinine, Ser: 0.71 mg/dL (ref 0.44–1.00)
GFR calc Af Amer: >60 mL/min (ref >60)
Glucose, Bld: 121 mg/dL — ABNORMAL HIGH (ref 65–99)
POTASSIUM: 3.7 mmol/L (ref 3.5–5.1)
SODIUM: 137 mmol/L (ref 135–145)

## 2017-01-03 MED ORDER — MELATONIN 3 MG PO TABS
6.0000 mg | ORAL_TABLET | Freq: Every day | ORAL | Status: DC
  Administered 2017-01-03 – 2017-01-04 (×2): 6 mg via ORAL
  Filled 2017-01-03 (×2): qty 2

## 2017-01-03 MED ORDER — ENSURE ENLIVE PO LIQD
237.0000 mL | Freq: Two times a day (BID) | ORAL | Status: DC
Start: 2017-01-03 — End: 2017-01-05
  Administered 2017-01-04 – 2017-01-05 (×2): 237 mL via ORAL

## 2017-01-03 MED ORDER — MELATONIN 5 MG PO TABS: 8.0000 mg | ORAL_TABLET | Freq: Every day | ORAL | Status: DC

## 2017-01-03 NOTE — Progress Notes (Signed)
Inpatient Rehabilitation  Please see consult by Dr. Posey Pronto for full details; while patient is not at baseline level of functioning note that spouse requires more time to move to a first floor apartment and doesn't think that this intensity of rehab is most appropriate.  Will sign off at this time.  Discussed with nurse case manager and CSW.  Call if questions.    Carmelia Roller., CCC/SLP Admission Coordinator  [place]  Cell [phone]

## 2017-01-03 NOTE — Progress Notes (Signed)
Nutrition Follow-up  DOCUMENTATION CODES:   Not applicable  INTERVENTION:   Ensure Enlive po BID, each supplement provides 350 kcal and 20 grams of protein  NUTRITION DIAGNOSIS:   Inadequate oral intake related to inability to eat as evidenced by NPO status.  Resolved- intake progressing  GOAL:   Patient will meet greater than or equal to 90% of their needs  Progressing  MONITOR:   Vent status, Labs, Weight trends, Skin, I & O's  REASON FOR ASSESSMENT:   Ventilator    ASSESSMENT:   Patient is 67 year old Caucasian female with past medical history of severe dementia with depression, agitation (benzodiazepine dependent). She was about to go to the shower when patient had witnessed seizure by her husband (no evidence of head trauma, no witnessed head trauma). Patient continued to seize by the time she was seen by MD. She was completely out in status epilepticus, not responsive and hypotensive or hemodynamically unstable. MD had a long discussion with the husband and the son who both made the decision about intubating her for short time to be able to do the CAT scan and further workup.   12/27- extubated  Pt diet advanced 12/28 to DYS 3 diet with thin liquids. Spoke with husband at bedside. States appetite is progressing but not yet back to baseline. Meal completions charted as 53% average for last eight meals. Husband unaware of any swallowing issues at this time, reports great toleration of texture. Pt tried Ensure yesterday and seemed to enjoy it. RD to order BID. Suggested supplements for home if intake becomes poor. Weight noted to be down 6 lb since last RD visit. Suspect this is from fluid loss.    Medications reviewed and include: MVI with minerals, KCl Labs reviewed.   Diet Order:  DIET DYS 3 Room service appropriate? Yes; Fluid consistency: Thin  EDUCATION NEEDS:   Not appropriate for education at this time  Skin:  Skin Assessment: Reviewed RN Assessment  Last  BM:  01/03/16  Height:   Ht Readings from Last 1 Encounters:  12/26/16 5\' 3"  (1.6 m)    Weight:   Wt Readings from Last 1 Encounters:  01/02/17 161 lb 6 oz (73.2 kg)    Ideal Body Weight:  52.3 kg  BMI:  Body mass index is 28.59 kg/m.  Estimated Nutritional Needs:   Kcal:  1500-1700 kcal/kg  Protein:  80-90 g/day  Fluid:  >1.5 L/day    Mariana Single RD, LDN Clinical Nutrition Pager # [phone]

## 2017-01-03 NOTE — Evaluation (Signed)
Occupational Therapy Evaluation Patient Details Name: Lisa Mclaughlin MRN: [id number] DOB: 11/02/1950 Today's Date: 01/03/2017    History of Present Illness Patient is 67 year old Caucasian female with past medical history of severe dementia with depression also history of agitation benzodiazepine dependent has been sleep for the past 2 days get down on the benzo happened 12/25 when she was about to go to the shower patient had witnessed seizure by her husband no evidence of head trauma no witnessed head trauma patient continued to seize.     Clinical Impression   Pt brighter initially today, however, upon standing pt with immediate DOE 3/4 - see below for details.   Pt with limited participation due to DOE, and obviously not feeling well, but she is unable to communicate what is wrong.  Vitals were stable.  RN notified, spouse present.      Follow Up Recommendations  SNF    Equipment Recommendations  3 in 1 bedside commode    Recommendations for Other Services       Precautions / Restrictions Precautions Precautions: Fall Precaution Comments: Alzheimer's       Mobility Bed Mobility Overal bed mobility: Needs Assistance Bed Mobility: Supine to Sit;Sit to Supine     Supine to sit: Min guard Sit to supine: Min assist   General bed mobility comments: assist to guide pt   Transfers Overall transfer level: Needs assistance Equipment used: 1 person hand held assist Transfers: Sit to/from [facility] Sit to Stand: Min assist Stand pivot transfers: Min assist       General transfer comment: HHA to steady     Balance Overall balance assessment: Needs assistance Sitting-balance support: Feet supported;No upper extremity supported Sitting balance-Leahy Scale: Fair     Standing balance support: Single extremity supported Standing balance-Leahy Scale: Poor Standing balance comment: loses balance posteriorly                            ADL either  performed or assessed with clinical judgement   ADL Overall ADL's : Needs assistance/impaired                         Toilet Transfer: Minimal assistance;+2 for physical assistance;Ambulation;Comfort height toilet   Toileting- Clothing Manipulation and Hygiene: Total assistance;Sit to/from stand       Functional mobility during ADLs: Minimal assistance;+2 for physical assistance       Vision         Perception     Praxis      Pertinent Vitals/Pain Pain Assessment: Faces Faces Pain Scale: Hurts little more Pain Location: unable to state Pain Descriptors / Indicators: Grimacing Pain Intervention(s): Monitored during session     Hand Dominance     Extremity/Trunk Assessment             Communication     Cognition Arousal/Alertness: Awake/alert;Lethargic Behavior During Therapy: Flat affect;WFL for tasks assessed/performed Overall Cognitive Status: Impaired/Different from baseline                                 General Comments: Pt with h/o Alzheimer's.  Pt more antimated today, and will occasionally initiate questions/comments.  Spouse reports she is getting closer to her baseline    General Comments  Pt stood today, with immediate DOE 3/4.  02 sats 96% on RA.  BP 99/64 in standing.  Unable  to get orthostatic pressure as pt spontaneously stood during monitoring.  Pt returned to seated.  Upon standing second time BP 120/77.   Pt indicated she wanted to ambulate but distance limited today due to DOE, and pt stopping mid stride staring straight ahead.  She is unable to communicate what is wrong except to say "i don't know" and indicates a need to return to room.  Upon return to room, pt sat immediately in chair, mildy diaphoretic.  BP 120/77, HR 101, 02 sats 95% on RA.   Pt stood to walk to bed, then leaned her head on the sink vanity.  Once pt returned to bed, she had incontinent episode.  While turning her to clean peri area, she resisted rolling  to Rt and stated "please don't".   No evidence of nystagmus noted.  3 mins later, pt rolled to Rt without difficulty.  RN in room, and notified of behaviors.  Spouse reports these behaviors are atypical     Exercises  .   Shoulder Instructions      Home Living                                          Prior Functioning/Environment                   OT Problem List:        OT Treatment/Interventions:      OT Goals(Current goals can be found in the care plan section)    OT Frequency: Min 2X/week   Barriers to D/C:            Co-evaluation              AM-PAC PT "6 Clicks" Daily Activity     Outcome Measure Help from another person eating meals?: A Lot Help from another person taking care of personal grooming?: A Lot Help from another person toileting, which includes using toliet, bedpan, or urinal?: Total Help from another person bathing (including washing, rinsing, drying)?: Total Help from another person to put on and taking off regular upper body clothing?: Total Help from another person to put on and taking off regular lower body clothing?: Total 6 Click Score: 8   End of Session Nurse Communication: Mobility status  Activity Tolerance: Patient limited by fatigue Patient left: in bed;with call bell/phone within reach;with nursing/sitter in room;with family/visitor present  OT Visit Diagnosis: Unsteadiness on feet (R26.81);Cognitive communication deficit (R41.841)                Time: 8756-4332 OT Time Calculation (min): 74 min Charges:  OT General Charges $OT Visit: 1 Visit OT Treatments $Therapeutic Activity: 68-82 mins G-Codes:     [facility], OTR/L [phone]   Lucille Passy M 01/03/2017, 6:31 PM

## 2017-01-03 NOTE — Progress Notes (Signed)
PROGRESS NOTE  Lisa Mclaughlin FBP:[id number] DOB: [birth year] DOA: 12/26/2016 PCP: Tonia Ghent, MD  HPI/Recap of past 24 hours: 67 yr old female with hx of severe dementia, bipolar, hiatal hernia-admit 12/25 with seizures and intubated-loaded with Fosphenytoin and Keppra and admit by CCM. Pt was subsequently extubated and transferred to [facility] for further management.  Overnight pt noted to be agitated/sun downing. Today, pt appeared restless, but easily calmed by husband at bedside. Pt unable to communicate, with inappropriate answers. Husband present at bedside, was concerned about her chronic SOB.    Assessment/Plan: Principal Problem:   Status epilepticus ([place]) Active Problems:   Vitamin D deficiency   Depression with anxiety   Dementia in Alzheimer's disease   Agitation   Acute respiratory failure (HCC)   Attention deficit disorder   Bipolar affective disorder (HCC)   Seizures (HCC)   Elevated blood pressure reading   Leukocytosis   Hypokalemia   Supplemental oxygen dependent  #Status Epilepticus Resolved Avoiding wellbutrin/abilify Continue citalopram and ativan as per neuro [facility] need [facility]  #Acute hypoxic resp failure Resolved, although has chronic worsening SOB Currently not on oxygen, sat well Risk of Aspiration PNa--no overt evidence-holding off of Abx, Dys 3 diet CXR on 01/03/17: showed Left lower lobe collapse or consolidation with probable superimposed left pleural effusion No fever of leukocytosis Due to chronic SOB will order ECHO pending Hx of heavy tobacco abuse, quit over 28yrs ??COPD  #Mod-severe dementia Cont Namenda 10, aricept 10  #HTN continue coreg 3.125, clonidine 0.1 bid, Imdur 30 od today  #Hypokalemia Replace prn  #Bipolar see above meds changes--would not use meds which lower Sz threshold    Code Status: Full  Family Communication: Discussed with husband at bedside  Disposition Plan:  SNF Vs CIR   Consultants:  Neurology  PCCM   Procedures:  None  Antimicrobials:  None  DVT prophylaxis:  Lovenox   Objective: Vitals:   01/02/17 2049 01/03/17 0115 01/03/17 0518 01/03/17 1006  BP: 126/61 127/76 (!) 142/75 (!) 145/65  Pulse: 74 79 77 77  Resp: 18 18 18 18   Temp: 98 F (36.7 C) 97.9 F (36.6 C) 98.1 F (36.7 C) 97.9 F (36.6 C)  TempSrc: Axillary Oral Oral Oral  SpO2: 93% 93% 94% 91%  Weight:      Height:        Intake/Output Summary (Last 24 hours) at 01/03/2017 1249 Last data filed at 01/03/2017 1037 Gross per 24 hour  Intake 960 ml  Output -  Net 960 ml   Filed Weights   12/31/16 0500 01/01/17 0113 01/02/17 0505  Weight: 73 kg (160 lb 15 oz) 75.9 kg (167 lb 5.3 oz) 73.2 kg (161 lb 6 oz)    Exam:   General: Awake, alert, confused, inappropriate answers to questions  Cardiovascular: S1-S2 present, no added heart sounds  Respiratory: Chest clear bilaterally  Abdomen: Soft, nontender, nondistended, bowel sounds present  Musculoskeletal: No pedal edema  Skin: Normal  Psychiatry: Unable to assess  Neuro: Minimally follows commands, unable to fully assess   Data Reviewed: CBC: Recent Labs  Lab 12/28/16 0426 12/29/16 0426 12/30/16 0422 01/02/17 0537  WBC 13.6* 13.8* 10.7* 7.8  NEUTROABS 11.0*  --   --  4.9  HGB 14.3 14.5 14.3 12.9  HCT 42.2 43.0 41.8 38.6  MCV 94.8 95.3 93.9 93.5  PLT 231 247 282 824   Basic Metabolic Panel: Recent Labs  Lab 12/28/16 0426 12/29/16 0426 12/30/16 0422  01/02/17 0537 01/03/17 0800  NA 136 135 136 138 137  K 3.1* 4.1 3.1* 2.9* 3.7  CL 102 103 102 106 102  CO2 24 20* 25 25 24   GLUCOSE 125* 116* 116* 98 121*  BUN 6 <5* 10 5* 8  CREATININE 0.62 0.61 0.69 0.55 0.71  CALCIUM 8.0* 8.5* 8.6* 8.4* 9.1  MG 1.8 2.1 2.0  --   --   PHOS  --  1.5*  --   --   --    GFR: Estimated Creatinine Clearance: 66.3 mL/min (by C-G formula based on SCr of 0.71 mg/dL). Liver Function Tests: Recent Labs   Lab 12/28/16 0426 12/30/16 0422  AST 24 23  ALT 22 29  ALKPHOS 80 80  BILITOT 0.8 0.7  PROT 5.6* 6.1*  ALBUMIN 2.7* 2.5*   No results for input(s): LIPASE, AMYLASE in the last 168 hours. No results for input(s): AMMONIA in the last 168 hours. Coagulation Profile: Recent Labs  Lab 12/28/16 0426 12/30/16 0422  INR 1.15 1.14   Cardiac Enzymes: No results for input(s): CKTOTAL, CKMB, CKMBINDEX, TROPONINI in the last 168 hours. BNP (last 3 results) Recent Labs    01/14/16 0926  PROBNP 74.0   HbA1C: No results for input(s): HGBA1C in the last 72 hours. CBG: No results for input(s): GLUCAP in the last 168 hours. Lipid Profile: Recent Labs    01/01/17 0424  TRIG 165*   Thyroid Function Tests: No results for input(s): TSH, T4TOTAL, FREET4, T3FREE, THYROIDAB in the last 72 hours. Anemia Panel: No results for input(s): VITAMINB12, FOLATE, FERRITIN, TIBC, IRON, RETICCTPCT in the last 72 hours. Urine analysis:    Component Value Date/Time   COLORURINE YELLOW 12/27/2016 0352   APPEARANCEUR TURBID (A) 12/27/2016 0352   LABSPEC 1.024 12/27/2016 0352   PHURINE 5.0 12/27/2016 0352   GLUCOSEU >=500 (A) 12/27/2016 0352   HGBUR LARGE (A) 12/27/2016 0352   HGBUR trace-lysed 07/26/2007 1551   BILIRUBINUR NEGATIVE 12/27/2016 0352   KETONESUR NEGATIVE 12/27/2016 0352   PROTEINUR 100 (A) 12/27/2016 0352   UROBILINOGEN 0.2 07/26/2007 1551   NITRITE NEGATIVE 12/27/2016 0352   LEUKOCYTESUR TRACE (A) 12/27/2016 0352   Sepsis Labs: @LABRCNTIP (procalcitonin:4,lacticidven:4)  ) Recent Results (from the past 240 hour(s))  MRSA PCR Screening     Status: None   Collection Time: 12/26/16  7:36 PM  Result Value Ref Range Status   MRSA by PCR NEGATIVE NEGATIVE Final    Comment:        The GeneXpert MRSA Assay (FDA approved for NASAL specimens only), is one component of a comprehensive MRSA colonization surveillance program. It is not intended to diagnose MRSA infection nor to  guide or monitor treatment for MRSA infections.   Culture, respiratory (NON-Expectorated)     Status: None   Collection Time: 12/28/16 11:10 AM  Result Value Ref Range Status   Specimen Description TRACHEAL ASPIRATE  Final   Special Requests NONE  Final   Gram Stain NO WBC SEEN FEW GRAM POSITIVE COCCI IN PAIRS   Final   Culture Consistent with normal respiratory flora.  Final   Report Status 12/30/2016 FINAL  Final      Studies: Dg Chest Port 1 View  Result Date: 01/03/2017 CLINICAL DATA:  67 year old female with shortness of Breath. Status post seizures in December, and intubated. EXAM: PORTABLE CHEST 1 VIEW COMPARISON:  12/28/2016 and earlier. FINDINGS: Portable AP semi upright view at 0955 hours. Extubated. Stable lung volumes. Mediastinal contours remain normal. No pneumothorax or pulmonary  edema. Continued dense retrocardiac opacity on the left. Possible left pleural effusion. Minor opacity at the right lung base more resembles atelectasis. Paucity bowel gas in the upper abdomen. Stable visualized osseous structures. IMPRESSION: 1. Extubated.  Stable lung volumes. 2. Left lower lobe collapse or consolidation with probable superimposed left pleural effusion. Electronically Signed   By: Genevie Ann M.D.   On: 01/03/2017 10:20    Scheduled Meds: . carvedilol  3.125 mg Oral BID WC  . chlorhexidine  15 mL Mouth Rinse BID  . donepezil  10 mg Oral QHS  . enoxaparin (LOVENOX) injection  40 mg Subcutaneous Q24H  . isosorbide mononitrate  30 mg Oral Daily  . levETIRAcetam  500 mg Oral BID  . LORazepam  0.5 mg Oral Q6H  . mouth rinse  15 mL Mouth Rinse q12n4p  . memantine  10 mg Oral Daily  . multivitamin with minerals  1 tablet Oral Daily  . potassium chloride  40 mEq Oral BID  . thiamine  100 mg Oral Daily   Or  . thiamine  100 mg Intravenous Daily    Continuous Infusions: . sodium chloride       LOS: 8 days     Alma Friendly, MD [facility]   If 7PM-7AM,  please contact night-coverage [email or web address] Password [facility] 01/03/2017, 12:49 PM

## 2017-01-03 NOTE — Clinical Social Work Note (Signed)
Clinical Social Work Assessment  Patient Details  Name: Lisa Mclaughlin MRN: [id number] Date of Birth: Nov 28, 1950  Date of referral:  01/02/17               Reason for consult:  Facility Placement                Permission sought to share information with:  Facility Sport and exercise psychologist, Family Supports Permission granted to share information::  Yes, Verbal Permission Granted  Name::     Audiological scientist::  SNF  Relationship::  Spouse  Contact Information:     Housing/Transportation Living arrangements for the past 2 months:  Apartment Source of Information:  Spouse Patient Interpreter Needed:  None Criminal Activity/Legal Involvement Pertinent to Current Situation/Hospitalization:  No - Comment as needed Significant Relationships:  Spouse, Adult Children Lives with:  Self, Spouse Do you feel safe going back to the place where you live?  Yes Need for family participation in patient care:  Yes (Comment)(patient not fully oriented)  Care giving concerns:  Patient lives at home with spouse on a second floor apartment, but will not be able to manage stairs at this time. Patient will need short term rehab to improve mobility as well as while the patient's husband works to move to a first floor apartment.   Social Worker assessment / plan:  CSW met with patient's husband at bedside to discuss recommendation for SNF. CSW discussed referral process and provided facility options. CSW will continue to follow.  Employment status:  Retired Nurse, adult PT Recommendations:  [facility] / Referral to community resources:  [place]  Patient/Family's Response to care:  Patient's husband agreeable to SNF placement.  Patient/Family's Understanding of and Emotional Response to Diagnosis, Current Treatment, and Prognosis:  Patient's husband discussed that he was aware that the patient would need more help, and how he is working towards  changing their home environment moving forward to better assist her. Patient's husband discussed facility preferences and appreciated CSW assistance.  Emotional Assessment Appearance:  Appears stated age Attitude/Demeanor/Rapport:  Unable to Assess Affect (typically observed):  Unable to Assess Orientation:  Oriented to Self Alcohol / Substance use:  Not Applicable Psych involvement (Current and /or in the community):  No (Comment)  Discharge Needs  Concerns to be addressed:  Care Coordination Readmission within the last 30 days:  No Current discharge risk:  Physical Impairment, Cognitively Impaired, Dependent with Mobility Barriers to Discharge:  Continued Medical Work up, Ship broker, Programmer, applications ([place])   Geralynn Ochs, [place] 01/03/2017, 9:26 AM

## 2017-01-04 ENCOUNTER — Inpatient Hospital Stay (HOSPITAL_COMMUNITY): Payer: Medicare Other

## 2017-01-04 DIAGNOSIS — I361 Nonrheumatic tricuspid (valve) insufficiency: Secondary | ICD-10-CM

## 2017-01-04 LAB — ECHOCARDIOGRAM COMPLETE
HEIGHTINCHES: 63 in
Weight: 2536.17 oz

## 2017-01-04 LAB — BASIC METABOLIC PANEL WITH GFR
Anion gap: 8 (ref 5–15)
BUN: 9 mg/dL (ref 6–20)
CO2: 24 mmol/L (ref 22–32)
Calcium: 8.8 mg/dL — ABNORMAL LOW (ref 8.9–10.3)
Chloride: 105 mmol/L (ref 101–111)
Creatinine, Ser: 0.69 mg/dL (ref 0.44–1.00)
GFR calc Af Amer: >60 mL/min (ref >60)
GFR calc non Af Amer: >60 mL/min (ref >60)
Glucose, Bld: 109 mg/dL — ABNORMAL HIGH (ref 65–99)
Potassium: 3.7 mmol/L (ref 3.5–5.1)
Sodium: 137 mmol/L (ref 135–145)

## 2017-01-04 LAB — CBC WITH DIFFERENTIAL/PLATELET
Basophils Absolute: 0 K/uL (ref 0.0–0.1)
Basophils Relative: 0 %
Eosinophils Absolute: 0.2 K/uL (ref 0.0–0.7)
Eosinophils Relative: 2 %
HCT: 39.4 % (ref 36.0–46.0)
Hemoglobin: 13.1 g/dL (ref 12.0–15.0)
Lymphocytes Relative: 18 %
Lymphs Abs: 2 K/uL (ref 0.7–4.0)
MCH: 31.6 pg (ref 26.0–34.0)
MCHC: 33.2 g/dL (ref 30.0–36.0)
MCV: 95.2 fL (ref 78.0–100.0)
Monocytes Absolute: 1 K/uL (ref 0.1–1.0)
Monocytes Relative: 9 %
Neutro Abs: 8 K/uL — ABNORMAL HIGH (ref 1.7–7.7)
Neutrophils Relative %: 71 %
Platelets: 364 K/uL (ref 150–400)
RBC: 4.14 MIL/uL (ref 3.87–5.11)
RDW: 13.3 % (ref 11.5–15.5)
WBC: 11.2 K/uL — ABNORMAL HIGH (ref 4.0–10.5)

## 2017-01-04 MED ORDER — FUROSEMIDE 10 MG/ML IJ SOLN
20.0000 mg | Freq: Once | INTRAMUSCULAR | Status: DC
  Filled 2017-01-04: qty 4

## 2017-01-04 MED ORDER — FUROSEMIDE 8 MG/ML PO SOLN
40.0000 mg | Freq: Once | ORAL | Status: DC
  Filled 2017-01-04 (×2): qty 5

## 2017-01-04 MED ORDER — FUROSEMIDE 10 MG/ML PO SOLN
40.0000 mg | Freq: Once | ORAL | Status: AC
  Administered 2017-01-04: 40 mg via ORAL
  Filled 2017-01-04: qty 4

## 2017-01-04 MED ORDER — METHOCARBAMOL 750 MG PO TABS
750.0000 mg | ORAL_TABLET | Freq: Once | ORAL | Status: AC
  Administered 2017-01-05: 750 mg via ORAL
  Filled 2017-01-04: qty 1

## 2017-01-04 NOTE — Progress Notes (Signed)
CSW following to facilitate discharge planning. CSW met with patient's husband at bedside to discuss bed offers and SNF placement options. Patient's husband requested information on [facility] bed status; CSW called and there were no beds available.  Patient's husband researched [facility] and said it would be acceptable. Patient's husband went to tour facility and came back to report that he was agreeable to facility. CSW confirmed bed availability with [facility] admissions; facility has initiated insurance authorization process.   CSW will continue to follow for discharge to SNF when medically stable.  Laveda Abbe, [place] Clinical Social Worker [phone]

## 2017-01-04 NOTE — Progress Notes (Signed)
  Echocardiogram 2D Echocardiogram has been performed.  Lisa Mclaughlin 01/04/2017, 3:35 PM

## 2017-01-04 NOTE — Progress Notes (Signed)
PT Cancellation Note  Patient Details Name: Lisa Mclaughlin MRN: [id number] DOB: [birth year]   Cancelled Treatment:    Reason Eval/Treat Not Completed: (P) Patient at procedure or test/unavailable Pt having ECHO performed. PT will follow back for treatment as able.   Nikolaus Pienta B. Migdalia Dk PT, DPT Acute Rehabilitation  [phone] Pager [phone]     [place] 01/04/2017, 3:22 PM

## 2017-01-04 NOTE — Progress Notes (Signed)
Hospitalist progress note         Lisa Mclaughlin  KGY:[id number] DOB: Jun 16, 1950 DOA: 12/26/2016 PCP: Tonia Ghent, MD   Specialists:   Brief Narrative:  24 fem severe dementia, bipolar, hiatal hernia-admit 12/25 with seizures and intubated-loaded with Fosphenytoin and Keppra and admit by CCM   Assessment & Plan:   Assessment:  Diagnoses of Altered mental state, Acute respiratory failure ([place]), Delirium, and SOB (shortness of breath) were pertinent to this visit.  Risk of Aspiration PNa--no overt evidence-holding off of Abx, Dys 3 diet-cxr from 1/2 is neg for pna but shows some fluid Acute hypoxic resp failure-needs oxygen as 88% on RA at rest-needs oxygen if will keep on--trial of iv lasix 20 daily and await echocardiogram ordered Status Epilepticus-avoiding wellbutrin/abilify-resuming citalopram and ativan as per neuro-need OP academic center testing dementia Mod-severe dementia-cont Namenda 10, aricept 10 Htn-continue coreg 3.125, clonidine 0.1 bid, Imdur 30 od, moderately well- ctrll Hypokalemia-start kdur 40 daily Bipolar-see above meds changes--wouild not use meds which lower Sz threshold Hiatal hernia   DVT prophylaxis: lovenox Code Status: full  Family Communication: d/w husband in person Disposition Plan: not ready for d/c yet-await echo and work-up hypoxia   Consultants:   neuro  Procedures:   none   Antimicrobials:   none   Subjective:  Awake alert bnut seems to have been confsued overnight No deficit sats all above 90 % at bedside  Objective: Vitals:   01/03/17 2137 01/04/17 0058 01/04/17 0500 01/04/17 0527  BP: (!) 135/99 (!) 145/96  136/85  Pulse: 75 81  80  Resp: 18 18  18   Temp: 97.7 F (36.5 C) 98 F (36.7 C)  98 F (36.7 C)  TempSrc: Axillary Oral  Axillary  SpO2: 95% 93%  92%  Weight:   71.9 kg (158 lb 8.2 oz)   Height:        Intake/Output Summary (Last 24 hours) at 01/04/2017 0743 Last data filed at 01/03/2017 1811 Gross per 24 hour  Intake  840 ml  Output -  Net 840 ml   Filed Weights   01/01/17 0113 01/02/17 0505 01/04/17 0500  Weight: 75.9 kg (167 lb 5.3 oz) 73.2 kg (161 lb 6 oz) 71.9 kg (158 lb 8.2 oz)    Examination:  eomi slight confusion cta b no added sound s1 s2 no m/r/g abd soft nt nd Neuro intact to movement and power-confused overall  Data Reviewed: I have personally reviewed following labs and imaging studies  CBC: Recent Labs  Lab 12/29/16 0426 12/30/16 0422 01/02/17 0537 01/04/17 0529  WBC 13.8* 10.7* 7.8 11.2*  NEUTROABS  --   --  4.9 PENDING  HGB 14.5 14.3 12.9 13.1  HCT 43.0 41.8 38.6 39.4  MCV 95.3 93.9 93.5 95.2  PLT 247 282 299 026   Basic Metabolic Panel: Recent Labs  Lab 12/29/16 0426 12/30/16 0422 01/02/17 0537 01/03/17 0800 01/04/17 0529  NA 135 136 138 137 137  K 4.1 3.1* 2.9* 3.7 3.7  CL 103 102 106 102 105  CO2 20* 25 25 24 24   GLUCOSE 116* 116* 98 121* 109*  BUN <5* 10 5* 8 9  CREATININE 0.61 0.69 0.55 0.71 0.69  CALCIUM 8.5* 8.6* 8.4* 9.1 8.8*  MG 2.1 2.0  --   --   --   PHOS 1.5*  --   --   --   --    GFR: Estimated Creatinine Clearance: 65.7 mL/min (by C-G formula based on SCr of 0.69 mg/dL). Liver  Function Tests: Recent Labs  Lab 12/30/16 0422  AST 23  ALT 29  ALKPHOS 80  BILITOT 0.7  PROT 6.1*  ALBUMIN 2.5*   No results for input(s): LIPASE, AMYLASE in the last 168 hours. No results for input(s): AMMONIA in the last 168 hours. Coagulation Profile: Recent Labs  Lab 12/30/16 0422  INR 1.14   Cardiac Enzymes: No results for input(s): CKTOTAL, CKMB, CKMBINDEX, TROPONINI in the last 168 hours. CBG: No results for input(s): GLUCAP in the last 168 hours. Urine analysis:    Component Value Date/Time   COLORURINE YELLOW 12/27/2016 0352   APPEARANCEUR TURBID (A) 12/27/2016 0352   LABSPEC 1.024 12/27/2016 0352   PHURINE 5.0 12/27/2016 0352   GLUCOSEU >=500 (A) 12/27/2016 0352   HGBUR LARGE (A) 12/27/2016 0352   HGBUR trace-lysed 07/26/2007 1551    BILIRUBINUR NEGATIVE 12/27/2016 0352   KETONESUR NEGATIVE 12/27/2016 0352   PROTEINUR 100 (A) 12/27/2016 0352   UROBILINOGEN 0.2 07/26/2007 1551   NITRITE NEGATIVE 12/27/2016 0352   LEUKOCYTESUR TRACE (A) 12/27/2016 0352     Radiology Studies: Reviewed images personally in health database    Scheduled Meds: . carvedilol  3.125 mg Oral BID WC  . chlorhexidine  15 mL Mouth Rinse BID  . donepezil  10 mg Oral QHS  . enoxaparin (LOVENOX) injection  40 mg Subcutaneous Q24H  . feeding supplement (ENSURE ENLIVE)  237 mL Oral BID BM  . isosorbide mononitrate  30 mg Oral Daily  . levETIRAcetam  500 mg Oral BID  . LORazepam  0.5 mg Oral Q6H  . mouth rinse  15 mL Mouth Rinse q12n4p  . Melatonin  6 mg Oral QHS  . memantine  10 mg Oral Daily  . multivitamin with minerals  1 tablet Oral Daily  . thiamine  100 mg Oral Daily   Or  . thiamine  100 mg Intravenous Daily   Continuous Infusions: . sodium chloride       LOS: 9 days    Time spent: Luis Lopez, MD [facility] ([phone]   If 7PM-7AM, please contact night-coverage [email or web address] Password [facility] 01/04/2017, 7:43 AM

## 2017-01-04 NOTE — Progress Notes (Signed)
OT Cancellation Note  Patient Details Name: Lisa Mclaughlin MRN: [id number] DOB: Jan 30, 1950   Cancelled Treatment:    Reason Eval/Treat Not Completed: Patient at procedure or test/ unavailable.  Pt having ECHO performed.  Will reattempt.  [place], OTR/L [phone]   Lucille Passy M 01/04/2017, 3:27 PM

## 2017-01-05 DIAGNOSIS — G40901 Epilepsy, unspecified, not intractable, with status epilepticus: Secondary | ICD-10-CM | POA: Diagnosis not present

## 2017-01-05 DIAGNOSIS — E7849 Other hyperlipidemia: Secondary | ICD-10-CM | POA: Diagnosis not present

## 2017-01-05 DIAGNOSIS — G308 Other Alzheimer's disease: Secondary | ICD-10-CM | POA: Diagnosis not present

## 2017-01-05 DIAGNOSIS — M6281 Muscle weakness (generalized): Secondary | ICD-10-CM | POA: Diagnosis not present

## 2017-01-05 DIAGNOSIS — I200 Unstable angina: Secondary | ICD-10-CM | POA: Diagnosis not present

## 2017-01-05 DIAGNOSIS — R2681 Unsteadiness on feet: Secondary | ICD-10-CM | POA: Diagnosis not present

## 2017-01-05 DIAGNOSIS — I2510 Atherosclerotic heart disease of native coronary artery without angina pectoris: Secondary | ICD-10-CM | POA: Diagnosis not present

## 2017-01-05 DIAGNOSIS — G4709 Other insomnia: Secondary | ICD-10-CM | POA: Diagnosis not present

## 2017-01-05 DIAGNOSIS — I10 Essential (primary) hypertension: Secondary | ICD-10-CM | POA: Diagnosis not present

## 2017-01-05 DIAGNOSIS — M810 Age-related osteoporosis without current pathological fracture: Secondary | ICD-10-CM | POA: Diagnosis not present

## 2017-01-05 DIAGNOSIS — I5022 Chronic systolic (congestive) heart failure: Secondary | ICD-10-CM | POA: Diagnosis not present

## 2017-01-05 DIAGNOSIS — M419 Scoliosis, unspecified: Secondary | ICD-10-CM | POA: Diagnosis not present

## 2017-01-05 LAB — CBC WITH DIFFERENTIAL/PLATELET
BASOS PCT: 1 %
Basophils Absolute: 0.1 10*3/uL (ref 0.0–0.1)
EOS ABS: 0.2 10*3/uL (ref 0.0–0.7)
Eosinophils Relative: 2 %
HCT: 42.6 % (ref 36.0–46.0)
HEMOGLOBIN: 14.1 g/dL (ref 12.0–15.0)
LYMPHS ABS: 2.2 10*3/uL (ref 0.7–4.0)
Lymphocytes Relative: 24 %
MCH: 30.9 pg (ref 26.0–34.0)
MCHC: 33.1 g/dL (ref 30.0–36.0)
MCV: 93.2 fL (ref 78.0–100.0)
MONO ABS: 1.1 10*3/uL — AB (ref 0.1–1.0)
MONOS PCT: 12 %
NEUTROS PCT: 63 %
Neutro Abs: 5.8 10*3/uL (ref 1.7–7.7)
PLATELETS: 371 10*3/uL (ref 150–400)
RBC: 4.57 MIL/uL (ref 3.87–5.11)
RDW: 13.1 % (ref 11.5–15.5)
WBC: 9.2 10*3/uL (ref 4.0–10.5)

## 2017-01-05 LAB — BASIC METABOLIC PANEL
Anion gap: 11 (ref 5–15)
BUN: 7 mg/dL (ref 6–20)
CALCIUM: 9.3 mg/dL (ref 8.9–10.3)
CHLORIDE: 103 mmol/L (ref 101–111)
CO2: 23 mmol/L (ref 22–32)
Creatinine, Ser: 0.76 mg/dL (ref 0.44–1.00)
GFR calc non Af Amer: >60 mL/min (ref >60)
Glucose, Bld: 107 mg/dL — ABNORMAL HIGH (ref 65–99)
Potassium: 3.6 mmol/L (ref 3.5–5.1)
Sodium: 137 mmol/L (ref 135–145)

## 2017-01-05 MED ORDER — FUROSEMIDE 10 MG/ML PO SOLN
40.0000 mg | Freq: Every day | ORAL | Status: DC
  Filled 2017-01-05: qty 4

## 2017-01-05 MED ORDER — THIAMINE HCL 100 MG PO TABS: 100.0000 mg | ORAL_TABLET | Freq: Every day | ORAL | 0 refills | Status: DC

## 2017-01-05 MED ORDER — LEVETIRACETAM 500 MG PO TABS: 500.0000 mg | ORAL_TABLET | Freq: Two times a day (BID) | ORAL | 0 refills | Status: DC

## 2017-01-05 MED ORDER — CITALOPRAM HYDROBROMIDE 40 MG PO TABS: 40.0000 mg | ORAL_TABLET | Freq: Every day | ORAL | 0 refills | Status: DC

## 2017-01-05 MED ORDER — CLONIDINE HCL 0.1 MG PO TABS: 0.1000 mg | ORAL_TABLET | Freq: Two times a day (BID) | ORAL | 11 refills | Status: DC | PRN

## 2017-01-05 MED ORDER — FUROSEMIDE 10 MG/ML PO SOLN: 40.0000 mg | Freq: Every day | ORAL | 12 refills | Status: DC

## 2017-01-05 MED ORDER — LORAZEPAM 0.5 MG PO TABS: 0.5000 mg | ORAL_TABLET | Freq: Four times a day (QID) | ORAL | 0 refills | Status: DC

## 2017-01-05 NOTE — Progress Notes (Signed)
Pt discharged from hospital per orders from MD. Pt's family aware of transfer to [facility]. Called and gave report to [place] at [facility]. Pt exited hospital via stretcher.

## 2017-01-05 NOTE — Discharge Summary (Signed)
Physician Discharge Summary  Lisa Mclaughlin JHE:[id number] DOB: Jul 15, 1950 DOA: 12/26/2016  PCP: Tonia Ghent, MD  Admit date: 12/26/2016 Discharge date: 01/05/2017  Time spent: 25 minutes  Recommendations for Outpatient Follow-up:  1. Needs SNF level care on d/c 2. Get bmet 1 week as started on lasix 3. consider academic center referral for dementia once d/c from facility  Discharge Diagnoses:  Principal Problem:   Status epilepticus ([place]) Active Problems:   Vitamin D deficiency   Depression with anxiety   Dementia in Alzheimer's disease   Agitation   Acute respiratory failure (HCC)   Attention deficit disorder   Bipolar affective disorder (HCC)   Seizures (HCC)   Elevated blood pressure reading   Leukocytosis   Hypokalemia   Supplemental oxygen dependent   Discharge Condition: improved  Diet recommendation: hh low salt  Filed Weights   01/02/17 0505 01/04/17 0500 01/05/17 0931  Weight: 73.2 kg (161 lb 6 oz) 71.9 kg (158 lb 8.2 oz) 73.7 kg (162 lb 7.7 oz)    History of present illness:   Hospital Course:   Brief Narrative:  26 fem severe dementia, bipolar, hiatal hernia-admit 12/25 with seizures and intubated-loaded with Fosphenytoin and Keppra and admit by CCM    Assessment & Plan:   Assessment:  Diagnoses of Altered mental state, Acute respiratory failure ([place]), Delirium, and SOB (shortness of breath) were pertinent to this visit.   Risk of Aspiration PNa--no overt evidence-holding off of Abx, Dys 3 diet-cxr from 1/2 is neg for pna but shows some fluid Acute hypoxic resp failure-needs oxygen as 88% on RA at rest-needs oxygen if will keep on--trial of iv lasix 20 daily and await echocardiogram ordered--ECho showed preserved EF-will d/c home on lasix 40 daily-bmet 1 week Status Epilepticus-avoiding wellbutrin/abilify-resuming citalopram and ativan as per neuro-need OP academic center testing dementia--Needs referral as OP from PCP Mod-severe dementia-cont Namenda  10, aricept 10  Htn-continue coreg 3.125, clonidine 0.1 bid, Imdur 30 od, moderately well- ctrll Hypokalemia-start kdur 40 daily Bipolar-see above meds changes--wouild not use meds which lower Sz threshold Hiatal hernia    Procedures:  multiple  Consultations:  Eeg, mri etc  Discharge Exam: Vitals:   01/05/17 0630 01/05/17 0931  BP: (!) 150/64 (!) 109/50  Pulse: 80 81  Resp: 18 20  Temp:  97.7 F (36.5 C)  SpO2: 93% 94%    General: alert pleasant and in nad Cardiovascular: s1 s 2no m/r/g Respiratory: clear no added sound Confused but calmer more awake alert  Power 5/5  Discharge Instructions   Discharge Instructions    Diet - low sodium heart healthy   Complete by:  As directed    Increase activity slowly   Complete by:  As directed      Allergies as of 01/05/2017      Reactions   Gluten Meal Other (See Comments)   Allergic sensitivity   Other Other (See Comments)   Allergic sensitivity-Food allergies: almond, banana, casein, cheese, cola, egg white, flaxseed, gluten, malt, cow and goat milks, mushrooms, pineapple, salmon, sesame, [place], wheat, whey, bakers and brewers yeast, yogurt.    Tetracycline Hcl Other (See Comments)   unknown      Medication List    STOP taking these medications   ARIPiprazole 5 MG tablet Commonly known as:  [name]   buPROPion 300 MG 24 hr tablet Commonly known as:  WELLBUTRIN XL     TAKE these medications   aspirin 81 MG tablet Take 1 tablet (81 mg total) by  mouth daily.   carvedilol 3.125 MG tablet Commonly known as:  COREG TAKE 1 TABLET(3.125 MG) BY MOUTH TWICE DAILY   citalopram 40 MG tablet Commonly known as:  CELEXA Take 1 tablet (40 mg total) by mouth daily.   cloNIDine 0.1 MG tablet Commonly known as:  CATAPRES Take 1 tablet (0.1 mg total) by mouth 2 (two) times daily as needed (For sustained SBP > 160).   donepezil 23 MG Tabs tablet Commonly known as:  ARICEPT Take 1 tablet (23 mg total) by mouth at  bedtime.   estrogens (conjugated) 0.9 MG tablet Commonly known as:  PREMARIN Take 0.9 mg by mouth daily.   furosemide 10 MG/ML solution Commonly known as:  LASIX Take 4 mLs (40 mg total) by mouth daily.   HIGH POTENCY MULTIVIT/MIN/IRON Tabs Take 1 tablet by mouth daily.   isosorbide mononitrate 30 MG 24 hr tablet Commonly known as:  IMDUR TAKE 1 TABLET(30 MG) BY MOUTH DAILY   levETIRAcetam 500 MG tablet Commonly known as:  KEPPRA Take 1 tablet (500 mg total) by mouth 2 (two) times daily.   LORazepam 0.5 MG tablet Commonly known as:  ATIVAN Take 1 tablet (0.5 mg total) by mouth every 6 (six) hours. What changed:    how much to take  when to take this  reasons to take this   MELATONIN PO Take 8 mg by mouth at bedtime.   memantine 10 MG tablet Commonly known as:  NAMENDA TAKE 1 TABLET(10 MG) BY MOUTH TWICE DAILY   nitroGLYCERIN 0.4 MG SL tablet Commonly known as:  NITROSTAT Place 1 tablet (0.4 mg total) under the tongue every 5 (five) minutes as needed for chest pain.   progesterone 200 MG capsule Commonly known as:  PROMETRIUM Take 1 capsule by mouth at bedtime.   thiamine 100 MG tablet Take 1 tablet (100 mg total) by mouth daily.   Vitamin D3 5000 units Caps Take 5,000 Units by mouth daily.      Allergies  Allergen Reactions  . Gluten Meal Other (See Comments)    Allergic sensitivity  . Other Other (See Comments)    Allergic sensitivity-Food allergies: almond, banana, casein, cheese, cola, egg white, flaxseed, gluten, malt, cow and goat milks, mushrooms, pineapple, salmon, sesame, [place], wheat, whey, bakers and brewers yeast, yogurt.   . Tetracycline Hcl Other (See Comments)    unknown      The results of significant diagnostics from this hospitalization (including imaging, microbiology, ancillary and laboratory) are listed below for reference.    Significant Diagnostic Studies: Ct Head Wo Contrast  Result Date: 12/26/2016 CLINICAL DATA:   Witnessed seizures 45 minutes ago. EXAM: CT HEAD WITHOUT CONTRAST TECHNIQUE: Contiguous axial images were obtained from the base of the skull through the vertex without intravenous contrast. COMPARISON:  MR brain 10/01/2012 FINDINGS: Brain: No evidence of acute infarction, hemorrhage, extra-axial collection, ventriculomegaly, or mass effect. Generalized cerebral atrophy. Periventricular white matter low attenuation likely secondary to microangiopathy. Vascular: No hyperdense vessels. No significant intracranial atherosclerotic disease. Skull: Negative for fracture or focal lesion. Sinuses/Orbits: Visualized portions of the orbits are unremarkable. Visualized portions of the paranasal sinuses and mastoid air cells are unremarkable. Other: None. IMPRESSION: 1.  No acute intracranial pathology. 2. Chronic microvascular disease and cerebral atrophy. Electronically Signed   By: Kathreen Devoid   On: 12/26/2016 19:40   Mr Brain Wo Contrast  Result Date: 12/28/2016 CLINICAL DATA:  Dementia, probable Alzheimer's.  Seizure EXAM: MRI HEAD WITHOUT CONTRAST TECHNIQUE: Multiplanar, multiecho pulse  sequences of the brain and surrounding structures were obtained without intravenous contrast. COMPARISON:  CT head 12/26/2016, MRI 10/01/2012 FINDINGS: Brain: Advanced atrophy, with significant progression since 2014. Bilateral hippocampal atrophy has progressed as well and is likely related Alzheimer's disease. Negative for acute or chronic ischemia. Negative for hemorrhage or mass. No fluid collection or midline shift. Vascular: Normal arterial flow voids. Skull and upper cervical spine: Negative Sinuses/Orbits: Negative Other: None IMPRESSION: No acute intracranial abnormality Advanced atrophy, with progression since 2014. Bilateral hippocampal atrophy, consistent with Alzheimer's disease given the clinical assessment. Electronically Signed   By: Franchot Gallo M.D.   On: 12/28/2016 10:27   Dg Chest Port 1 View  Result Date:  01/03/2017 CLINICAL DATA:  67 year old female with shortness of Breath. Status post seizures in December, and intubated. EXAM: PORTABLE CHEST 1 VIEW COMPARISON:  12/28/2016 and earlier. FINDINGS: Portable AP semi upright view at 0955 hours. Extubated. Stable lung volumes. Mediastinal contours remain normal. No pneumothorax or pulmonary edema. Continued dense retrocardiac opacity on the left. Possible left pleural effusion. Minor opacity at the right lung base more resembles atelectasis. Paucity bowel gas in the upper abdomen. Stable visualized osseous structures. IMPRESSION: 1. Extubated.  Stable lung volumes. 2. Left lower lobe collapse or consolidation with probable superimposed left pleural effusion. Electronically Signed   By: Genevie Ann M.D.   On: 01/03/2017 10:20   Dg Chest Port 1 View  Result Date: 12/28/2016 CLINICAL DATA:  67 year old female with a history of respiratory failure and seizure EXAM: PORTABLE CHEST 1 VIEW COMPARISON:  Multiple chest x-ray 12/26/2016 FINDINGS: Cardiomediastinal silhouette unchanged in size and contour. Low lung volumes with linear opacities at the lung bases. Blunting of the left costophrenic angle. Double density overlies the lower mediastinum. No pneumothorax.  No new confluent airspace disease. Endotracheal tube is unchanged in position terminating approximately 1.6 cm above the carina. IMPRESSION: Low lung volumes persist with basilar atelectasis/ consolidation. Possible left-sided pleural effusion. Unchanged endotracheal tube terminating 1.6 cm above the carina. Hiatal hernia Electronically Signed   By: Corrie Mckusick D.O.   On: 12/28/2016 07:52   Dg Chest Port 1 View  Result Date: 12/26/2016 CLINICAL DATA:  Endotracheal tube placement. EXAM: PORTABLE CHEST 1 VIEW COMPARISON:  Radiograph of same day.  CT scan of March 31, 2016. FINDINGS: Stable cardiomegaly. Atherosclerosis of thoracic aorta is noted. Endotracheal tube is in stable position with distal tip 1 cm above  the carina. Nasogastric tube has been removed. No pneumothorax is noted. Minimal right basilar subsegmental atelectasis is noted. Large hiatal hernia is noted. Probable left basilar atelectasis and effusion is noted. Right lower lobe nodule is noted which was present on prior CT scan. Bony thorax is unremarkable. IMPRESSION: Nasogastric tube has been removed. Endotracheal tube is stable in position. Aortic atherosclerosis. Mild bibasilar subsegmental atelectasis is noted. Large hiatal hernia is noted. Right lower lobe nodule is noted as described on prior CTs scan; follow-up CT or PET scan is recommended to rule out malignancy. Electronically Signed   By: Marijo Conception, M.D.   On: 12/26/2016 18:46   Dg Chest Port 1 View  Result Date: 12/26/2016 CLINICAL DATA:  Status post intubation. EXAM: PORTABLE CHEST 1 VIEW COMPARISON:  Radiograph of same day. FINDINGS: Stable cardiomegaly. No pneumothorax is noted. Nasogastric tube tip is seen in the mid esophagus. Endotracheal tube is in grossly good position with distal tip 2 cm above the carina. Mild bibasilar subsegmental atelectasis is noted. Bony thorax is unremarkable. IMPRESSION: Endotracheal tube in  grossly good position. Distal tip of nasogastric tube is seen in expected position of mid esophagus. Advancement is recommended. Mild bibasilar subsegmental atelectasis. Electronically Signed   By: Marijo Conception, M.D.   On: 12/26/2016 18:03   Dg Chest Portable 1 View  Result Date: 12/26/2016 CLINICAL DATA:  Intubation.  Multiple seizures. EXAM: PORTABLE CHEST 1 VIEW COMPARISON:  Chest x-ray earlier same day 12:49 p.m., 03/23/2016 and earlier. CT heart 03/31/2016. FINDINGS: Endotracheal tube tip in the origin of the right mainstem bronchus. This should be withdrawn approximately 4-5 cm. Nasogastric tube tip within the hiatal hernia in the lower chest. Nodule in the right costophrenic angle as noted previously. Development of dense left lower lobe atelectasis  since earlier today. Mild right basilar atelectasis which is unchanged. No new pulmonary parenchymal abnormalities elsewhere. IMPRESSION: 1. Right mainstem bronchus intubation. The endotracheal tube should with withdrawn approximately 4-5 cm for appropriate positioning in the distal trachea. 2. Nasogastric tube tip within the hiatal hernia in the lower chest. 3. New dense left lower lobe atelectasis since earlier today. Stable mild right basilar atelectasis. 4. Right basilar lung nodule as noted previously. Electronically Signed   By: Evangeline Dakin M.D.   On: 12/26/2016 17:54   Dg Chest Port 1 View  Result Date: 12/26/2016 CLINICAL DATA:  Patient status post multiple seizures today. EXAM: PORTABLE CHEST 1 VIEW COMPARISON:  PA and lateral chest 03/31/2016.  CT chest 03/23/2016. FINDINGS: A nodule in the right lower lobe measures 1.4 cm transverse compared to 1.1 cm on the prior plain film of the chest. Lungs otherwise clear. Heart size is normal. Hiatal hernia is noted. IMPRESSION: No acute disease. Nodule in the right lower lobe which had measured 1.1 cm in diameter on the prior plain films measures 1.4 cm today. Consider follow-up PET CT scan. Hiatal hernia. Electronically Signed   By: Inge Rise M.D.   On: 12/26/2016 13:21    Microbiology: Recent Results (from the past 240 hour(s))  MRSA PCR Screening     Status: None   Collection Time: 12/26/16  7:36 PM  Result Value Ref Range Status   MRSA by PCR NEGATIVE NEGATIVE Final    Comment:        The GeneXpert MRSA Assay (FDA approved for NASAL specimens only), is one component of a comprehensive MRSA colonization surveillance program. It is not intended to diagnose MRSA infection nor to guide or monitor treatment for MRSA infections.   Culture, respiratory (NON-Expectorated)     Status: None   Collection Time: 12/28/16 11:10 AM  Result Value Ref Range Status   Specimen Description TRACHEAL ASPIRATE  Final   Special Requests NONE   Final   Gram Stain NO WBC SEEN FEW GRAM POSITIVE COCCI IN PAIRS   Final   Culture Consistent with normal respiratory flora.  Final   Report Status 12/30/2016 FINAL  Final     Labs: Basic Metabolic Panel: Recent Labs  Lab 12/30/16 0422 01/02/17 0537 01/03/17 0800 01/04/17 0529 01/05/17 0520  NA 136 138 137 137 137  K 3.1* 2.9* 3.7 3.7 3.6  CL 102 106 102 105 103  CO2 25 25 24 24 23   GLUCOSE 116* 98 121* 109* 107*  BUN 10 5* 8 9 7   CREATININE 0.69 0.55 0.71 0.69 0.76  CALCIUM 8.6* 8.4* 9.1 8.8* 9.3  MG 2.0  --   --   --   --    Liver Function Tests: Recent Labs  Lab 12/30/16 0422  AST 23  ALT 29  ALKPHOS 80  BILITOT 0.7  PROT 6.1*  ALBUMIN 2.5*   No results for input(s): LIPASE, AMYLASE in the last 168 hours. No results for input(s): AMMONIA in the last 168 hours. CBC: Recent Labs  Lab 12/30/16 0422 01/02/17 0537 01/04/17 0529 01/05/17 0520  WBC 10.7* 7.8 11.2* 9.2  NEUTROABS  --  4.9 8.0* 5.8  HGB 14.3 12.9 13.1 14.1  HCT 41.8 38.6 39.4 42.6  MCV 93.9 93.5 95.2 93.2  PLT 282 299 364 371   Cardiac Enzymes: No results for input(s): CKTOTAL, CKMB, CKMBINDEX, TROPONINI in the last 168 hours. BNP: BNP (last 3 results) No results for input(s): BNP in the last 8760 hours.  ProBNP (last 3 results) Recent Labs    01/14/16 0926  PROBNP 74.0    CBG: No results for input(s): GLUCAP in the last 168 hours.     Signed:  Nita Sells MD   [facility] 01/05/2017, 9:47 AM

## 2017-01-05 NOTE — Care Management Important Message (Signed)
Important Message  Patient Details  Name: Lisa Mclaughlin MRN: [id number] Date of Birth: Aug 23, 1950   Medicare Important Message Given:  Yes    Webber Michiels Abena 01/05/2017, 11:26 AM

## 2017-01-05 NOTE — Care Management Note (Signed)
Case Management Note  Patient Details  Name: Babette Stum MRN: [id number] Date of Birth: 04-16-1950  Subjective/Objective:                    Action/Plan: Pt discharging to [facility] today. No further needs per CM.  Expected Discharge Date:  01/05/17               Expected Discharge Plan:  Skilled Nursing Facility  In-House Referral:  Clinical Social Work  Discharge planning Services     Post Acute Care Choice:    Choice offered to:     DME Arranged:    DME Agency:     HH Arranged:    [place] Agency:     Status of Service:  Completed, signed off  If discussed at [employer], dates discussed:    Additional Comments:  Pollie Friar, RN 01/05/2017, 11:17 AM

## 2017-01-05 NOTE — Clinical Social Work Placement (Signed)
Nurse to call report to [phone], Room 107B    CLINICAL SOCIAL WORK PLACEMENT  NOTE  Date:  01/05/2017  Patient Details  Name: Lisa Mclaughlin MRN: [id number] Date of Birth: 07-13-1950  Clinical Social Work is seeking post-discharge placement for this patient at the [place] level of care (*CSW will initial, date and re-position this form in  chart as items are completed):  Yes   Patient/family provided with [place] Work Department's list of facilities offering this level of care within the geographic area requested by the patient (or if unable, by the patient's family).  Yes   Patient/family informed of their freedom to choose among providers that offer the needed level of care, that participate in Medicare, Medicaid or managed care program needed by the patient, have an available bed and are willing to accept the patient.  Yes   Patient/family informed of [place]'s ownership interest in [facility] and [facility], as well as of the fact that they are under no obligation to receive care at these facilities.  PASRR submitted to EDS on 01/01/17     PASRR number received on 01/03/17     Existing PASRR number confirmed on       FL2 transmitted to all facilities in geographic area requested by pt/family on 01/01/17     FL2 transmitted to all facilities within larger geographic area on       Patient informed that his/her managed care company has contracts with or will negotiate with certain facilities, including the following:        Yes   Patient/family informed of bed offers received.  Patient chooses bed at [facility]     Physician recommends and patient chooses bed at      Patient to be transferred to [facility] on 01/05/17.  Patient to be transferred to facility by [facility]     Patient family notified on 01/05/17 of transfer.  Name of family member notified:  Domingo Cocking     PHYSICIAN       Additional Comment:     _______________________________________________ Geralynn Ochs, LCSW 01/05/2017, 10:49 AM

## 2017-01-06 ENCOUNTER — Other Ambulatory Visit: Payer: Self-pay | Admitting: Family Medicine

## 2017-01-08 ENCOUNTER — Other Ambulatory Visit: Payer: Self-pay | Admitting: *Deleted

## 2017-01-08 ENCOUNTER — Telehealth: Payer: Self-pay

## 2017-01-08 ENCOUNTER — Encounter: Payer: Self-pay | Admitting: Family Medicine

## 2017-01-08 ENCOUNTER — Ambulatory Visit (INDEPENDENT_AMBULATORY_CARE_PROVIDER_SITE_OTHER): Payer: Medicare Other | Admitting: Family Medicine

## 2017-01-08 VITALS — BP 140/78 | HR 75 | Temp 97.8°F | Wt 150.5 lb

## 2017-01-08 DIAGNOSIS — R569 Unspecified convulsions: Secondary | ICD-10-CM

## 2017-01-08 DIAGNOSIS — R29898 Other symptoms and signs involving the musculoskeletal system: Secondary | ICD-10-CM | POA: Diagnosis not present

## 2017-01-08 DIAGNOSIS — F0280 Dementia in other diseases classified elsewhere without behavioral disturbance: Secondary | ICD-10-CM

## 2017-01-08 DIAGNOSIS — G309 Alzheimer's disease, unspecified: Secondary | ICD-10-CM | POA: Diagnosis not present

## 2017-01-08 NOTE — Progress Notes (Signed)
Inpatient follow-up.  Patient had been on [name] and Wellbutrin for years.  She had been doing relatively well recently, given her baseline level of dementia.  She has been able [place], with significant help.  Overall that had gone as well as possible.  She then subsequently had a seizure at home, followed by repeat seizure in the emergency room.  She required additional benzodiazepine for acute treatment with intubation for airway protection.  She was weaned off the ventilator, started on Keppra in the meantime, tapered off of Wellbutrin and [name] was stopped.  She is still on citalopram with benzodiazepine as needed for anxiety.  She was stable for discharge and went to a rehab facility, but there was difficulty there since the facility was set up mainly for rehabilitation and not specifically for dementia care.  Given the situation and the husband thought it was best to get the patient discharged back to home.  The on-call doctor was able to take care of refills time for the patient.   Here today for follow up.  She is due for labs.  She has been drowsy as expected since coming back home but she was notably sleep deprived after being up most of the nights while at the rehab facility.  It is likely best for the patient to be at home in familiar surroundings given her dementia history, discussed.   Discussed with husband about getting home health physical therapy set up along with tertiary care neurology evaluation.  The patient has been getting more winded with exertion.  She is not able to walk as far as she could prior to the recent events.  No known fevers.  No vomiting.  She is taking oral medications.  Seizure activity.  No known adverse effect on current medications.  PMH and SH reviewed  ROS: Per HPI unless specifically indicated in ROS section   Meds, vitals, and allergies reviewed.   GEN: nad, alert and but not oriented.  She appears tired but not in  distress. HEENT: mucous membranes moist NECK: supple w/o LA CV: rrr. PULM: ctab, no inc wob ABD: soft, +bs EXT: 1+ BLE edema

## 2017-01-08 NOTE — Telephone Encounter (Signed)
Sent.  Thanks.  Notified at [place].

## 2017-01-08 NOTE — Telephone Encounter (Signed)
PLEASE NOTE: All timestamps contained within this report are represented as [place] Standard Time. CONFIDENTIALTY NOTICE: This fax transmission is intended only for the addressee. It contains information that is legally privileged, confidential or otherwise protected from use or disclosure. If you are not the intended recipient, you are strictly prohibited from reviewing, disclosing, copying using or disseminating any of this information or taking any action in reliance on or regarding this information. If you have received this fax in error, please notify us immediately by telephone so that we can arrange for its return to [place]. Phone: [phone], Toll-Free: [phone], Fax: [phone] Page: 1 of 3 Call Id: [id number] [place] Night - Client >>>Contains Verbal Order - Signature Required<<< [place] Patient Name: Lisa Mclaughlin Gender: Female DOB: April 25, 1950 Age: 67 Y 71 M Return Phone Number: [phone] (Primary) Address: City/State/Zip: [place] [address] Client [facility] Night - Client Client Site [place] Physician Renford Dills - MD Contact Type Call Who Is Calling Patient / Member / Family / Caregiver Call Type Triage / Clinical Caller Name Halia Franey Relationship To Patient Spouse Return Phone Number [phone] (Primary) Chief Complaint Prescription Refill or Medication Request (non symptomatic) Reason for Call Medication Question / Request Initial Comment Caller states his wife has Alzheimer's and suffered 2 seizures on 12/26/2016 and wasreleased to a skilled nursing facility. Husband states the facility was not equipped to keep an Alzheimer's patient overnight and she was just brought back home. She was prescribed 3 medications Keppra 500 mg, Lasik 40 mg, and Clonidine Hcl. Caller is most concerned about getting the Keppra 500 mg refilled to  avoid another seizure. Translation No Nurse Assessment Nurse: Matthew Folks, RN, Hannelore Date/Time (Eastern Time): 01/06/2017 6:40:44 PM Confirm and document reason for call. If symptomatic, describe symptoms. ---Caller states wife is at home after a night in skilled rehab unit. They need the keppra script 500mg  1 tab BID, also needs Lasix 40 mg daily, Clonidine 0.1mg  q 12 h as needed. Wife had 2 sz's Christmas day, was discharged yesterday and went to rehab for the night then they came home because they could not take care of her. Attending md at hospital prescribed these. Rehab unit gave him the information for her meds. [facility] MD Rehab. Dr. Raliegh Ip at [facility] was the [facility] MD seeing her, ?attending. Dr. Carren Rang intensivist, Dr. Rory Percy, neurologist, prescribed keppra. [employer] [phone]. Denies triage. Does the patient have any new or worsening symptoms? ---No Guidelines Guideline Title Affirmed Question Affirmed Notes Nurse Date/Time (Eastern Time) Disp. Time Eilene Ghazi Time) Disposition Final User 01/06/2017 6:55:17 PM Paged On Call back to [facility], RN, [facility] 01/06/2017 7:16:03 PM Paged On Call back to Call Center Shook-Minyard, RN, Hannelore PLEASE NOTE: All timestamps contained within this report are represented as [place] Standard Time. CONFIDENTIALTY NOTICE: This fax transmission is intended only for the addressee. It contains information that is legally privileged, confidential or otherwise protected from use or disclosure. If you are not the intended recipient, you are strictly prohibited from reviewing, disclosing, copying using or disseminating any of this information or taking any action in reliance on or regarding this information. If you have received this fax in error, please notify us immediately by telephone so that we can arrange for its return to [place]. Phone: [phone], Toll-Free: [phone], Fax: [phone] Page: 2 of 3 Call  Id: [id number] [place]. Time Eilene Ghazi Time) Disposition Final User 01/06/2017  7:16:41 PM Paged On Call back to [facility], RN, [facility] 01/06/2017 7:50:43 PM Clinical Call Yes Shook-Minyard, RN, Hannelore Verbal Orders/Maintenance Medications Medication Refill Route Dosage Regime Duration Admin Instructions User Name keppra 500 mg Oral 1 Months Keppra 500 mg po BID Qty 60 No refills Shook-Minyard, RN, Hannelore Lasix 40 mg Oral 1 Months Lasix 40 mg po daily Qty 30 No refills Shook-Minyard, RN, Hannelore Clonidine 0.1 mg Oral 1 As Needed clonidine 0.1mg  po prn Qty 10 No refills Shook-Minyard, RN, Hannelore Comments User: Janece Canterbury, RN Date/Time (Eastern Time): 01/06/2017 7:47:42 PM Called medications in to pharmacy for MD who is out and not near her computer. User: Janece Canterbury, RN Date/Time Eilene Ghazi Time): 01/06/2017 7:49:54 PM Spoke with pts husband and let him know her meds are there and to call Dr. Damita Dunnings on Monday and talk with him and decide on the plan for her and her continued medications. Husband understands. Paging DoctorName Phone DateTime Result/Outcome Message Type Notes Eliezer Lofts - MD [phone] 01/06/2017 6:55:17 PM Paged On Call Back to Call Center Doctor Paged [phone] [facility] RN [place], [place] [phone] 01/06/2017 7:16:03 PM Called on Call provider - No message left Doctor Paged Eliezer Lofts - MD [phone] 01/06/2017 7:16:41 PM Paged On Call Back to Call Center Doctor Paged [phone] [facility] RN [facility] Eliezer Lofts - MD 01/06/2017 7:31:24 PM Spoke with On Call - General Message Result Decided to call meds in and have them talk to Dr Damita Dunnings on Monday. PLEASE NOTE: All timestamps contained within this report are represented as [place] Standard Time. CONFIDENTIALTY NOTICE: This fax transmission is intended only for the addressee. It contains information that is legally privileged, confidential or otherwise  protected from use or disclosure. If you are not the intended recipient, you are strictly prohibited from reviewing, disclosing, copying using or disseminating any of this information or taking any action in reliance on or regarding this information. If you have received this fax in error, please notify us immediately by telephone so that we can arrange for its return to [place]. Phone: [phone], Toll-Free: [phone], Fax: [phone] Page: 3 of 3 Call Id: [id number] [place] >>>Contains Verbal Order - Signature Required<<< [address], [place] [address] [phone] Fax: [phone] MEDICATION ORDER [place] Night - Client [place] - Night Date: 01/06/2017 From: [facility] Department To: Renford Dills - MD Please sign the order for the approved drug(s) given by our call center nurse on your behalf. Fax to [phone] within 5 business days. Thank you. Date (Eastern Time): 01/06/2017 6:16:49 PM Triage RN: Janece Canterbury, RN NAME: Jeryl Columbia PHONE NUMBER: [phone] (Primary) BIRTHDATE: October 22, 1950 [place] [address] CALLER: Spouse NAME: Crista Luria Rx Given Medication Refill Route Dosage Regime Duration Admin Instructions User Name keppra 500 mg Oral 1 Months Keppra 500 mg po BID Qty 60 No refills Shook-Minyard, RN, Hannelore Lasix 40 mg Oral 1 Months Lasix 40 mg po daily Qty 30 No refills Shook-Minyard, RN, Hannelore Clonidine 0.1 mg Oral 1 As Needed clonidine 0.1mg  po prn Qty 10 No refills Shook-Minyard, RN, Ilsa Iha MD Signature Date

## 2017-01-08 NOTE — Telephone Encounter (Signed)
Electronic refill request. Levetiracetam Last office visit:   01/08/17 Last Filled:   By Larence Penning MD 60 tablet 0 01/05/2017  Please advise.

## 2017-01-08 NOTE — Patient Instructions (Addendum)
I would only use clonidine if prolonged elevation in BP >160/>90. We'll make plans about the lasix after I see your labs.  We'll contact you with your lab report. Rosaria Ferries will call about your referral. Take care.  Glad to see you.  Keep the appointment as schedule with neurology.

## 2017-01-08 NOTE — Telephone Encounter (Signed)
Pt has appt with Dr Damita Dunnings 01/08/17 at 2:30.

## 2017-01-09 ENCOUNTER — Encounter: Payer: Self-pay | Admitting: Family Medicine

## 2017-01-09 ENCOUNTER — Other Ambulatory Visit: Payer: Self-pay | Admitting: Family Medicine

## 2017-01-09 DIAGNOSIS — R748 Abnormal levels of other serum enzymes: Secondary | ICD-10-CM

## 2017-01-09 LAB — CBC WITH DIFFERENTIAL/PLATELET
BASOS ABS: 0.1 10*3/uL (ref 0.0–0.1)
Basophils Relative: 0.8 % (ref 0.0–3.0)
EOS ABS: 0.1 10*3/uL (ref 0.0–0.7)
Eosinophils Relative: 1 % (ref 0.0–5.0)
HEMATOCRIT: 42.2 % (ref 36.0–46.0)
HEMOGLOBIN: 14 g/dL (ref 12.0–15.0)
LYMPHS PCT: 24.1 % (ref 12.0–46.0)
Lymphs Abs: 1.6 10*3/uL (ref 0.7–4.0)
MCHC: 33.1 g/dL (ref 30.0–36.0)
MCV: 96.7 fl (ref 78.0–100.0)
MONO ABS: 0.7 10*3/uL (ref 0.1–1.0)
Monocytes Relative: 9.7 % (ref 3.0–12.0)
Neutro Abs: 4.4 10*3/uL (ref 1.4–7.7)
Neutrophils Relative %: 64.4 % (ref 43.0–77.0)
Platelets: 467 10*3/uL — ABNORMAL HIGH (ref 150.0–400.0)
RBC: 4.37 Mil/uL (ref 3.87–5.11)
RDW: 13.3 % (ref 11.5–15.5)
WBC: 6.8 10*3/uL (ref 4.0–10.5)

## 2017-01-09 LAB — COMPREHENSIVE METABOLIC PANEL
ALBUMIN: 3.6 g/dL (ref 3.5–5.2)
ALT: 42 U/L — AB (ref 0–35)
AST: 24 U/L (ref 0–37)
Alkaline Phosphatase: 215 U/L — ABNORMAL HIGH (ref 39–117)
BILIRUBIN TOTAL: 0.4 mg/dL (ref 0.2–1.2)
BUN: 13 mg/dL (ref 6–23)
CALCIUM: 9.4 mg/dL (ref 8.4–10.5)
CHLORIDE: 101 meq/L (ref 96–112)
CO2: 26 mEq/L (ref 19–32)
Creatinine, Ser: 0.7 mg/dL (ref 0.40–1.20)
GFR: 88.73 mL/min (ref >60.00)
Glucose, Bld: 96 mg/dL (ref 70–99)
Potassium: 4.3 mEq/L (ref 3.5–5.1)
SODIUM: 135 meq/L (ref 135–145)
Total Protein: 6.8 g/dL (ref 6.0–8.3)

## 2017-01-09 MED ORDER — FUROSEMIDE 40 MG PO TABS: 40.0000 mg | ORAL_TABLET | Freq: Every day | ORAL | Status: DC | PRN

## 2017-01-09 NOTE — Assessment & Plan Note (Addendum)
With dementia at baseline, more recently with seizure.  [name] and Wellbutrin have both been stopped.  Still on baseline dementia medications along with citalopram and benzodiazepine.  She had significant history of anxiety and had used Wellbutrin/[name]/diazepam previously for anxiety.  At this point, continue at baseline dementia medications with citalopram and as needed benzodiazepine.  At this point still okay for outpatient follow-up.  Reasonable to get home health PT set up.  I will check on tertiary care neurology referral.  Prescription given for wheelchair since the patient's ability to walk distance is now limited, she is deconditioned.  Recheck routine labs today.  Keppra.  Hold on further dose of Lasix until we can recheck her labs.  See notes on labs.  Patient has not used clonidine since discharge.  Discussed with husband about using this only for significant blood pressure elevations.  >25 minutes spent in face to face time with patient, >50% spent in counselling or coordination of care.

## 2017-01-10 ENCOUNTER — Telehealth: Payer: Self-pay | Admitting: Family Medicine

## 2017-01-10 DIAGNOSIS — K589 Irritable bowel syndrome without diarrhea: Secondary | ICD-10-CM | POA: Diagnosis not present

## 2017-01-10 DIAGNOSIS — E785 Hyperlipidemia, unspecified: Secondary | ICD-10-CM | POA: Diagnosis not present

## 2017-01-10 DIAGNOSIS — Z7982 Long term (current) use of aspirin: Secondary | ICD-10-CM | POA: Diagnosis not present

## 2017-01-10 DIAGNOSIS — M810 Age-related osteoporosis without current pathological fracture: Secondary | ICD-10-CM | POA: Diagnosis not present

## 2017-01-10 DIAGNOSIS — E559 Vitamin D deficiency, unspecified: Secondary | ICD-10-CM | POA: Diagnosis not present

## 2017-01-10 DIAGNOSIS — G309 Alzheimer's disease, unspecified: Secondary | ICD-10-CM | POA: Diagnosis not present

## 2017-01-10 DIAGNOSIS — Z87891 Personal history of nicotine dependence: Secondary | ICD-10-CM | POA: Diagnosis not present

## 2017-01-10 DIAGNOSIS — I2510 Atherosclerotic heart disease of native coronary artery without angina pectoris: Secondary | ICD-10-CM | POA: Diagnosis not present

## 2017-01-10 NOTE — Telephone Encounter (Signed)
Please give the order for the PT, etc.  I would hold the lasix at this point with the BP drop noted.   Encourage PO fluids.  I would not use clonidine.  If she continues to have low BPs then let me know.  We may need to taper her imdur.  Thanks.

## 2017-01-10 NOTE — Telephone Encounter (Signed)
Tried to call Lisa Mclaughlin back and no answer and no voicemail. Will have to call again later.

## 2017-01-10 NOTE — Telephone Encounter (Signed)
Copied from [place] [id number]. Topic: General - Other >> Jan 10, 2017 10:14 AM Lolita Rieger, RMA wrote: Clair Gulling from [facility] called and needs verbal orders for PT for 2 times a week for 4 weeks, OT for ADL's and speech cognition, skilled nursing for  poor nutrition and disease control, social worker for community resources.  Clair Gulling also stated that pt systolic drops 41JSC during standing and pt is fatigued and tired without any exertion Please call [phone]

## 2017-01-11 ENCOUNTER — Encounter: Payer: Self-pay | Admitting: Family Medicine

## 2017-01-11 ENCOUNTER — Ambulatory Visit (INDEPENDENT_AMBULATORY_CARE_PROVIDER_SITE_OTHER): Payer: Medicare Other | Admitting: Family Medicine

## 2017-01-11 VITALS — BP 102/60 | HR 71 | Temp 98.2°F | Wt 150.2 lb

## 2017-01-11 DIAGNOSIS — F0280 Dementia in other diseases classified elsewhere without behavioral disturbance: Secondary | ICD-10-CM

## 2017-01-11 DIAGNOSIS — R945 Abnormal results of liver function studies: Secondary | ICD-10-CM | POA: Diagnosis not present

## 2017-01-11 DIAGNOSIS — G309 Alzheimer's disease, unspecified: Secondary | ICD-10-CM | POA: Diagnosis not present

## 2017-01-11 DIAGNOSIS — R7989 Other specified abnormal findings of blood chemistry: Secondary | ICD-10-CM

## 2017-01-11 LAB — COMPREHENSIVE METABOLIC PANEL
ALBUMIN: 3.6 g/dL (ref 3.5–5.2)
ALK PHOS: 239 U/L — AB (ref 39–117)
ALT: 32 U/L (ref 0–35)
AST: 20 U/L (ref 0–37)
BILIRUBIN TOTAL: 0.3 mg/dL (ref 0.2–1.2)
BUN: 12 mg/dL (ref 6–23)
CALCIUM: 9.3 mg/dL (ref 8.4–10.5)
CO2: 27 mEq/L (ref 19–32)
Chloride: 103 mEq/L (ref 96–112)
Creatinine, Ser: 0.76 mg/dL (ref 0.40–1.20)
GFR: 80.7 mL/min (ref >60.00)
Glucose, Bld: 108 mg/dL — ABNORMAL HIGH (ref 70–99)
Potassium: 4.5 mEq/L (ref 3.5–5.1)
Sodium: 138 mEq/L (ref 135–145)
TOTAL PROTEIN: 6.9 g/dL (ref 6.0–8.3)

## 2017-01-11 MED ORDER — LORAZEPAM 0.5 MG PO TABS: ORAL_TABLET | ORAL | Status: DC

## 2017-01-11 MED ORDER — ISOSORBIDE MONONITRATE ER 30 MG PO TB24: ORAL_TABLET | ORAL | Status: DC

## 2017-01-11 NOTE — Patient Instructions (Signed)
Given the low BP:  Stay off clonidine.  Hold isosorbide for now.  Use lasix only if significant ankle swelling.   Cut the lorazepam back to 1 tab in the AM, 1/2 tab midday, and 1 tab in the PM.  See if that helps with daytime alertness.   Keep the neurology appointment.  I'm checking on options in the meantime.   We'll contact you with your lab report. Update me as needed.  I'll await the home health and neurology notes.  Take care.  Glad to see you.

## 2017-01-11 NOTE — Progress Notes (Signed)
Dementia and SZ f/u.  Still drinking fluids and appetite picked up recently, in the last 2 days.  No vomiting.  No abd pain.    Sleeping well at night but still drowsy during the day.  Taking lorazepam 0.5mg  per dose, taking 3 tabs per day.    BP is still low, hasn't had lasix or clonidone.    HH came out yesterday for initial eval.  Others will come out soon and the program will start.    D/w pt's husband about tertiary care referral. See orders.  No SZ activity.    Prev mild inc in LFTs, due for f/u.  No abd pain, no jaundice.    Meds, vitals, and allergies reviewed.   ROS: Per HPI unless specifically indicated in ROS section   GEN: nad, alert and but not oriented.  She appears tired but not in distress. Smiles with conversation.  HEENT: mucous membranes moist NECK: supple w/o LA CV: rrr. PULM: ctab, no inc wob ABD: soft, +bs EXT: trace BLE edema

## 2017-01-11 NOTE — Telephone Encounter (Signed)
Clair Gulling, at [place] phone number was incorrect.  [phone].  Left detailed message on voicemail.

## 2017-01-11 NOTE — Telephone Encounter (Signed)
Husband advised of instructions.

## 2017-01-12 ENCOUNTER — Telehealth: Payer: Self-pay | Admitting: Family Medicine

## 2017-01-12 DIAGNOSIS — Z7982 Long term (current) use of aspirin: Secondary | ICD-10-CM | POA: Diagnosis not present

## 2017-01-12 DIAGNOSIS — K589 Irritable bowel syndrome without diarrhea: Secondary | ICD-10-CM | POA: Diagnosis not present

## 2017-01-12 DIAGNOSIS — Z87891 Personal history of nicotine dependence: Secondary | ICD-10-CM | POA: Diagnosis not present

## 2017-01-12 DIAGNOSIS — M810 Age-related osteoporosis without current pathological fracture: Secondary | ICD-10-CM | POA: Diagnosis not present

## 2017-01-12 DIAGNOSIS — G309 Alzheimer's disease, unspecified: Principal | ICD-10-CM

## 2017-01-12 DIAGNOSIS — F0280 Dementia in other diseases classified elsewhere without behavioral disturbance: Secondary | ICD-10-CM

## 2017-01-12 DIAGNOSIS — I2510 Atherosclerotic heart disease of native coronary artery without angina pectoris: Secondary | ICD-10-CM | POA: Diagnosis not present

## 2017-01-12 DIAGNOSIS — E785 Hyperlipidemia, unspecified: Secondary | ICD-10-CM | POA: Diagnosis not present

## 2017-01-12 DIAGNOSIS — E559 Vitamin D deficiency, unspecified: Secondary | ICD-10-CM | POA: Diagnosis not present

## 2017-01-12 NOTE — Telephone Encounter (Signed)
CRM for notification. See Telephone encounter for:   01/12/17.  Relation to pt: self Call back number: [phone]   Reason for call:  Spouse wanted to inform PCP , [facility] and [facility] are in [place] regarding dementia specialist, please advise

## 2017-01-12 NOTE — Telephone Encounter (Signed)
Refer to [facility]. I put in the order.  Thanks.

## 2017-01-13 DIAGNOSIS — M810 Age-related osteoporosis without current pathological fracture: Secondary | ICD-10-CM | POA: Diagnosis not present

## 2017-01-13 DIAGNOSIS — G309 Alzheimer's disease, unspecified: Secondary | ICD-10-CM | POA: Diagnosis not present

## 2017-01-13 DIAGNOSIS — E559 Vitamin D deficiency, unspecified: Secondary | ICD-10-CM | POA: Diagnosis not present

## 2017-01-13 DIAGNOSIS — Z87891 Personal history of nicotine dependence: Secondary | ICD-10-CM | POA: Diagnosis not present

## 2017-01-13 DIAGNOSIS — E785 Hyperlipidemia, unspecified: Secondary | ICD-10-CM | POA: Diagnosis not present

## 2017-01-13 DIAGNOSIS — Z7982 Long term (current) use of aspirin: Secondary | ICD-10-CM | POA: Diagnosis not present

## 2017-01-13 DIAGNOSIS — I2510 Atherosclerotic heart disease of native coronary artery without angina pectoris: Secondary | ICD-10-CM | POA: Diagnosis not present

## 2017-01-13 DIAGNOSIS — K589 Irritable bowel syndrome without diarrhea: Secondary | ICD-10-CM | POA: Diagnosis not present

## 2017-01-15 ENCOUNTER — Telehealth: Payer: Self-pay

## 2017-01-15 ENCOUNTER — Telehealth: Payer: Self-pay | Admitting: Family Medicine

## 2017-01-15 DIAGNOSIS — K589 Irritable bowel syndrome without diarrhea: Secondary | ICD-10-CM | POA: Diagnosis not present

## 2017-01-15 DIAGNOSIS — I2510 Atherosclerotic heart disease of native coronary artery without angina pectoris: Secondary | ICD-10-CM | POA: Diagnosis not present

## 2017-01-15 DIAGNOSIS — Z87891 Personal history of nicotine dependence: Secondary | ICD-10-CM | POA: Diagnosis not present

## 2017-01-15 DIAGNOSIS — E559 Vitamin D deficiency, unspecified: Secondary | ICD-10-CM | POA: Diagnosis not present

## 2017-01-15 DIAGNOSIS — Z7982 Long term (current) use of aspirin: Secondary | ICD-10-CM | POA: Diagnosis not present

## 2017-01-15 DIAGNOSIS — M810 Age-related osteoporosis without current pathological fracture: Secondary | ICD-10-CM | POA: Diagnosis not present

## 2017-01-15 DIAGNOSIS — E785 Hyperlipidemia, unspecified: Secondary | ICD-10-CM | POA: Diagnosis not present

## 2017-01-15 DIAGNOSIS — G309 Alzheimer's disease, unspecified: Secondary | ICD-10-CM | POA: Diagnosis not present

## 2017-01-15 MED ORDER — LORAZEPAM 0.5 MG PO TABS: ORAL_TABLET | ORAL | Status: DC

## 2017-01-15 NOTE — Telephone Encounter (Signed)
Husband advised and understands and agrees with instruction.

## 2017-01-15 NOTE — Telephone Encounter (Signed)
Please give the order.  Thanks.   

## 2017-01-15 NOTE — Assessment & Plan Note (Signed)
Given the low BP, would stay off clonidine.  Hold isosorbide for now.  Use lasix only if significant ankle swelling.   Cut the lorazepam back to 1 tab in the AM, 1/2 tab midday, and 1 tab in the PM, to see if that helps with daytime alertness.   Would keep the neurology appointment as scheduled.  See f/u orders.  Recheck labs today.  Update me as needed.  I'll await the home health and neurology notes.  I appreciate the help of all involved. >25 minutes spent in face to face time with patient, >50% spent in counselling or coordination of care.

## 2017-01-15 NOTE — Telephone Encounter (Signed)
Copied from [place]. Topic: Quick Communication - See Telephone Encounter >> Jan 15, 2017 11:58 AM Percell Belt A wrote: CRM for notification. See Telephone encounter for: Clair Gulling from [facility] [phone] Pt continues to be tired, no energy .  If there is any question please contact him  01/15/17.

## 2017-01-15 NOTE — Telephone Encounter (Signed)
Would cut lorazepam back by another 1/2 tab.  1/2 tab in AM, 1/2 tab midday, 1 tab in PM.   Her Alk phos is still mildly elevated but other LFTs are okay.  This isn't a common side effect with keppra. I'm going to talk to neuro about this prior to her upcoming appointment.  Thanks.  

## 2017-01-15 NOTE — Telephone Encounter (Signed)
Copied from [place] [id number]. Topic: General - Other >> Jan 15, 2017  2:47 PM Yvette Rack wrote: Reason for CRM: Ozella Almond skill nurse from [place] [phone] calling for orders for pt to have continue visits for med monitoring and nutrition for 1 week for 1 visit 2 weeks for 2 visits

## 2017-01-16 ENCOUNTER — Other Ambulatory Visit: Payer: Self-pay | Admitting: Family Medicine

## 2017-01-16 ENCOUNTER — Telehealth: Payer: Self-pay | Admitting: Family Medicine

## 2017-01-16 DIAGNOSIS — R945 Abnormal results of liver function studies: Principal | ICD-10-CM

## 2017-01-16 DIAGNOSIS — R7989 Other specified abnormal findings of blood chemistry: Secondary | ICD-10-CM

## 2017-01-16 NOTE — Telephone Encounter (Signed)
[place] with [facility] [place] advised.

## 2017-01-16 NOTE — Telephone Encounter (Signed)
Copied from [place]. Topic: General - Other >> Jan 16, 2017 12:39 PM Yvette Rack wrote: Reason for CRM: Ubaldo Glassing from [facility] [phone]  calling to let provider know that the pt husband called to cancell appt for today and that they will go visit pt on Friday

## 2017-01-16 NOTE — Telephone Encounter (Signed)
Noted. Thanks.

## 2017-01-17 ENCOUNTER — Encounter: Payer: Self-pay | Admitting: Neurology

## 2017-01-17 ENCOUNTER — Ambulatory Visit: Payer: Medicare Other | Admitting: Neurology

## 2017-01-17 VITALS — BP 110/80 | HR 78 | Ht 61.0 in | Wt 147.2 lb

## 2017-01-17 DIAGNOSIS — G2119 Other drug induced secondary parkinsonism: Secondary | ICD-10-CM

## 2017-01-17 DIAGNOSIS — Z7982 Long term (current) use of aspirin: Secondary | ICD-10-CM | POA: Diagnosis not present

## 2017-01-17 DIAGNOSIS — F0280 Dementia in other diseases classified elsewhere without behavioral disturbance: Secondary | ICD-10-CM | POA: Diagnosis not present

## 2017-01-17 DIAGNOSIS — K589 Irritable bowel syndrome without diarrhea: Secondary | ICD-10-CM | POA: Diagnosis not present

## 2017-01-17 DIAGNOSIS — G309 Alzheimer's disease, unspecified: Secondary | ICD-10-CM | POA: Diagnosis not present

## 2017-01-17 DIAGNOSIS — R945 Abnormal results of liver function studies: Secondary | ICD-10-CM | POA: Diagnosis not present

## 2017-01-17 DIAGNOSIS — E785 Hyperlipidemia, unspecified: Secondary | ICD-10-CM | POA: Diagnosis not present

## 2017-01-17 DIAGNOSIS — G300 Alzheimer's disease with early onset: Secondary | ICD-10-CM

## 2017-01-17 DIAGNOSIS — R740 Nonspecific elevation of levels of transaminase and lactic acid dehydrogenase [LDH]: Secondary | ICD-10-CM | POA: Diagnosis not present

## 2017-01-17 DIAGNOSIS — G40309 Generalized idiopathic epilepsy and epileptic syndromes, not intractable, without status epilepticus: Secondary | ICD-10-CM | POA: Diagnosis not present

## 2017-01-17 DIAGNOSIS — E559 Vitamin D deficiency, unspecified: Secondary | ICD-10-CM | POA: Diagnosis not present

## 2017-01-17 DIAGNOSIS — Z87891 Personal history of nicotine dependence: Secondary | ICD-10-CM | POA: Diagnosis not present

## 2017-01-17 DIAGNOSIS — R7989 Other specified abnormal findings of blood chemistry: Secondary | ICD-10-CM

## 2017-01-17 DIAGNOSIS — M810 Age-related osteoporosis without current pathological fracture: Secondary | ICD-10-CM | POA: Diagnosis not present

## 2017-01-17 DIAGNOSIS — I2510 Atherosclerotic heart disease of native coronary artery without angina pectoris: Secondary | ICD-10-CM | POA: Diagnosis not present

## 2017-01-17 MED ORDER — LEVETIRACETAM ER 500 MG PO TB24: 1000.0000 mg | ORAL_TABLET | Freq: Every day | ORAL | 5 refills | Status: DC

## 2017-01-17 NOTE — Progress Notes (Signed)
[facility]  Follow-up Visit   Date: 01/17/17    Lisa Mclaughlin MRN: [id number] DOB: 09-06-1950   Interim History: Lisa Mclaughlin is a 67 y.o. year-old right-handed Caucasian female with ADD (diagnosed, 4s), depression (diagnosed 2012), and CAD returning for early onset Alzheimer's disease and new onset seizures.  The patient was accompanied to the clinic by her husband.   History of present illness: Memory problems have been ongoing since ~2010, described as impairments with short-term memory, misplacing things, and trying to complete tasks. However, in March of 2013, her symptoms dramatically worsened and his behavior concerned her husband. He says that she started acting like she was in a dream world. She became very depressed and obsessed with childhood memories where she was bullied as a young girl. She became paranoid that these girls were coming after her because of unpaid club dues. She started seeing a psychologist, Dr. Terrance Mass, who diagnosed her with depression. Several medication adjustments were made and her depression is almost 100% better but her memory remains impaired. She is forgetting short-term memory of events, dates, and names. She feels that she is still able to function independently and does all of her ADLs. She stopped driving in 0258.   In 2015, patient and her husband researched options and tried to replicate a [facility] study for dietary supplements in Alzheimer's disease.  They were also seeing integrative therapy from [facility] and staying active in an exercise program. She also started seeing Dr. Audria Nine at [facility] and was been started on other supplements. In the Spring of 2015, she saw Dr. Werner Lean at [facility] who agreed with diagnosis of Alzheimer's dementia. Because of their interest in clinical trials related to supplements, he was give information on a trial in [facility]) which is the same that  they are trying to replicate at home.  He recommended tapering the namenda but there was mild worsening in memory, so restarted it.    Over the years, there has been gradual decline in memory, independent functioning, and agitation. In May, they started having a caregiver come 4-days per week staying 10-hours per day.  She is already taking wellbutrin 300mg , celexa 40mg , and abilify 2.5mg  daily for her depression/anxiety.    UPDATE 08/25/2016:   Over the past 6 months, there has been steady decline in functioning and memory.  She needs help with personal hygiene and toileting.  Her husband helps with bathing, dressing, and meal preparation. She is able to feed herself and sometimes will get something to eat from the fridge.  She is unable to perform an light duties around the home.   She continues to have a caregiver 4 days per week for 10 hours.  Because of worsening anxiety, her lorazepam has been increased to 0.5mg  QID which has helped.    UPDATE 01/17/2017:  She is here for sooner follow-up visit due to recent hospitalization for new onset seizures. On Christmas Day, her husband noted urine in the bed and when he attempted to bathe her, she started having generalized tonic-clonic activity for 2 minutes.  EMS was called and while in the ER, she at least 2 additional witnessed seizures.  She intubated and started on 500mg  BID.  She did not have any further clinical or EEG seizures.  MRI brain showed progressive generalized and hippocampal atrophy. Etiology was suspected to be secondary to either Wellbutrin or benzodiazepine withdrawal, as there was recent changes to her ativan.  While she  was hospitalized, her [name] was stopped due to concern of drug-induced parkinsonism.   Since her hospitalization, she has not had any further seizures. Of note, her alkaline phosphatase has been elevated, which is being evaluated by her PCP.  Prior to her hospitalization, she was having a lot of delirium which was being  managed with ativan as needed.  Since she has been home, she is no longer having any behavior issues, but is sleeping most of the day (up to 20 hours).  They will be getting home PT/OT and are looking into moving into a one-level handicap apartment, as they currently live in a 2nd floor apartment.  Memory continues to decline - some days, she is aware of her name and who her husband is, and others, she is confused.  She is completely dependent on care and requires 24 hour supervision.  Medications:  Current Outpatient Medications on File Prior to Visit  Medication Sig Dispense Refill  . aspirin 81 MG tablet Take 1 tablet (81 mg total) by mouth daily. 30 tablet 11  . carvedilol (COREG) 3.125 MG tablet TAKE 1 TABLET(3.125 MG) BY MOUTH TWICE DAILY 180 tablet 6  . Cholecalciferol (VITAMIN D3) 5000 units CAPS Take 5,000 Units by mouth daily.     . citalopram (CELEXA) 40 MG tablet Take 1 tablet (40 mg total) by mouth daily. 5 tablet 0  . donepezil (ARICEPT) 23 MG TABS tablet Take 1 tablet (23 mg total) by mouth at bedtime. 30 tablet 11  . estrogens, conjugated, (PREMARIN) 0.9 MG tablet Take 0.9 mg by mouth daily.     . furosemide (LASIX) 40 MG tablet Take 1 tablet (40 mg total) by mouth daily as needed for fluid.    . isosorbide mononitrate (IMDUR) 30 MG 24 hr tablet Hold as of 01/11/17.    Marland Kitchen levETIRAcetam (KEPPRA) 500 MG tablet TAKE 1 TABLET BY MOUTH TWICE DAILY 180 tablet 0  . LORazepam (ATIVAN) 0.5 MG tablet 1/2 tab in AM, 1/2 tab midday, 1 tab in PM 30 tablet   . MELATONIN PO Take 8 mg by mouth at bedtime.    . memantine (NAMENDA) 10 MG tablet TAKE 1 TABLET(10 MG) BY MOUTH TWICE DAILY 60 tablet 11  . Multiple Vitamins-Minerals (HIGH POTENCY MULTIVIT/MIN/IRON) TABS Take 1 tablet by mouth daily.     . nitroGLYCERIN (NITROSTAT) 0.4 MG SL tablet Place 1 tablet (0.4 mg total) under the tongue every 5 (five) minutes as needed for chest pain. 30 tablet 6  . progesterone (PROMETRIUM) 200 MG capsule Take 1  capsule by mouth at bedtime.  12  . thiamine 100 MG tablet Take 1 tablet (100 mg total) by mouth daily. 30 tablet 0   No current facility-administered medications on file prior to visit.     Allergies:  Allergies  Allergen Reactions  . [name] [Aripiprazole] Other (See Comments)    Seizure hx  . Wellbutrin [Bupropion] Other (See Comments)    Seizure hx  . Gluten Meal Other (See Comments)    Allergic sensitivity  . Other Other (See Comments)    Allergic sensitivity-Food allergies: almond, banana, casein, cheese, cola, egg white, flaxseed, gluten, malt, cow and goat milks, mushrooms, pineapple, salmon, sesame, [place], wheat, whey, bakers and brewers yeast, yogurt.   . Tetracycline Hcl Other (See Comments)    unknown     Review of Systems:  CONSTITUTIONAL: No fevers, chills, night sweats, or weight loss.   EYES: No visual changes or eye pain ENT: No hearing changes.  No  history of nose bleeds.   RESPIRATORY: No cough, wheezing, shortness of breath.   CARDIOVASCULAR: Negative for chest pain, and palpitations.   GI: Negative for abdominal discomfort, blood in stools or black stools.  No recent change in bowel habits.   GU:  No history of incontinence.   MUSCLOSKELETAL: No history of joint pain or swelling.  No myalgias.   SKIN: Negative for lesions, rash, and itching.   HEMATOLOGY/ONCOLOGY: Negative for prolonged bleeding, bruising easily, and swollen nodes.    ENDOCRINE: Negative for cold or heat intolerance, polydipsia or goiter.   PSYCH:  No depression or anxiety symptoms.   NEURO: As Above.   Vital Signs:  BP 110/80   Pulse 78   Ht 5\' 1"  (1.549 m)   Wt 147 lb 4 oz (66.8 kg)   SpO2 97%   BMI 27.82 kg/m   General:  She appears very disheveled, somnolent and sleeping in her transfer chair  Neurological Exam: Mental status:  She is very somnolent, but arousal to voice and opens her eyes for a bit.  When asked about her name, she talks nonsensically. Severely blunted  affect.  She does not engage in conversation.  Cranial nerves: Face is symmetric  She is antigravity in all extremities, formal strength testing was unable to be performed due to lack of cooperation.  Tone is normal.   Gait:  Gait is not tested, she is in a wheelchair today and very sleepy  Data: MRI brain 10/01/2012: Premature for age cerebral and cerebellar atrophy. Chronic microvascular ischemic change affecting the periventricular greater than subcortical white matter.  CT brain 06/07/2012: Mild premature atrophy. Chronic microvascular ischemic change. No acute intracranial findings.  Neuropsychiatric testing 08/09/2012, 09/05/2012: Serious deficits with in multiple cognitive domains suggestive of a severe global cognitive decline that far exceed what might be expected on the basis of depression and ADHD. 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  Labs:  05/24/2012:  TSH 0.53, B12 622  09/26/2012:  Vitamin E 0.8   IMPRESSION/PLAN: 1.  New onset generalized tonic-clonic seizures, likely due to medication effects of Wellbutrin vs benzodiazepine withdrawal  - Due to sedation, switch to Keppra XR 1000mg  at bedtime; if this is cost prohibitive go back to generic levitiracetam 500mg  BID  - Seizure precautions discussed, including as no unsupervised bathing/swimming  2.  Early onset and severe Alzheimer's dementia, clinically worsening  - He will be seeking the opinion of dementia specialist at [facility] for other management options  - Continue Aricept 23mg  daily and Namenda 10mg  twice daily  - Husband is tremendously supportive and caring and provides excellent care at home, with aide of caregivers  - Encouraged staying active as able, hopefully making adjustments to medication will allow her to stay more awake during the day to allow for her to engage in physical and mentally stimulating activities  3.  Elevated alkaline phosphatase - unlikely  related to keppra.    4.  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 She is now off [name].  If she clinically needs this for agitation, mood, or quality of life, it would be reasonable to restart and would defer this to psychiatry, as the benefit may outweigh risk of worsening parkinsonism.    Return to clinic in 4 months  Greater than 50% of this 25 minute visit was spent in counseling, explanation of diagnosis, planning of further management, and coordination of care.   Thank you for allowing me to  participate in patient's care.  If I can answer any additional questions, I would be pleased to do so.    Sincerely,    Donika K. Posey Pronto, DO

## 2017-01-17 NOTE — Patient Instructions (Addendum)
Switch to Keppra XR 1000mg  at bedtime  Continue 4 months

## 2017-01-18 ENCOUNTER — Telehealth: Payer: Self-pay | Admitting: Family Medicine

## 2017-01-18 DIAGNOSIS — R748 Abnormal levels of other serum enzymes: Secondary | ICD-10-CM

## 2017-01-18 LAB — GAMMA GT: GGT: 31 U/L (ref 3–65)

## 2017-01-18 NOTE — Telephone Encounter (Signed)
Please call husband.  I saw the patient's follow-up GGT level.  This was normal.  The issue is the source of her alkaline phosphatase elevation.  This could come from 1 of 2 main sources, either of the liver or a bone disorder.  Since her GGT level was normal, that suggests that a liver issue is not the cause.  It is more likely that the elevation is from a process in the bones.  I question if a combination of the seizures and then bed rest without weightbearing could have caused this to happen.  I do not think her medications are causing the issue. The question is what I will do at this point to work this up.  She has a mild elevation.  Her other lab values do not suggest an ominous process.  We could monitor this for now by rechecking her labs in 2-3 weeks.  It may resolve in the meantime.  The other option would be to do imaging of her bones to see if we could find an abnormal lesion was causing the elevation.  I do not know if it makes sense to put her through extra imaging at this point, given her baseline memory loss.  Even if we found some type of bony lesion, I do not think she would be a good candidate for an invasive procedure to try to address that abnormality. Long story short, I think it makes sense to recheck her labs in a few weeks.  We may be able to get that set up through home health.  See with the husband thinks about all of this and let me know if he has any questions.  Thanks.  I routed this to Dr. Posey Pronto as an Juluis Rainier, with appreciation.

## 2017-01-18 NOTE — Telephone Encounter (Signed)
Patient's husband advised and agrees with the plan.  Can we see if this can be done thru [facility]? Husband says patient is still very drowsy and would sleep 24/7 if allowed.  Husband asks when we might drop back another notch on the Lorazepam?

## 2017-01-19 ENCOUNTER — Encounter: Payer: Self-pay | Admitting: Family Medicine

## 2017-01-19 ENCOUNTER — Ambulatory Visit (INDEPENDENT_AMBULATORY_CARE_PROVIDER_SITE_OTHER): Payer: Medicare Other | Admitting: Family Medicine

## 2017-01-19 ENCOUNTER — Encounter: Payer: Self-pay | Admitting: *Deleted

## 2017-01-19 DIAGNOSIS — Z87891 Personal history of nicotine dependence: Secondary | ICD-10-CM | POA: Diagnosis not present

## 2017-01-19 DIAGNOSIS — K589 Irritable bowel syndrome without diarrhea: Secondary | ICD-10-CM | POA: Diagnosis not present

## 2017-01-19 DIAGNOSIS — J0100 Acute maxillary sinusitis, unspecified: Secondary | ICD-10-CM

## 2017-01-19 DIAGNOSIS — R748 Abnormal levels of other serum enzymes: Secondary | ICD-10-CM | POA: Diagnosis not present

## 2017-01-19 DIAGNOSIS — Z7982 Long term (current) use of aspirin: Secondary | ICD-10-CM | POA: Diagnosis not present

## 2017-01-19 DIAGNOSIS — G309 Alzheimer's disease, unspecified: Secondary | ICD-10-CM | POA: Diagnosis not present

## 2017-01-19 DIAGNOSIS — E785 Hyperlipidemia, unspecified: Secondary | ICD-10-CM | POA: Diagnosis not present

## 2017-01-19 DIAGNOSIS — M810 Age-related osteoporosis without current pathological fracture: Secondary | ICD-10-CM | POA: Diagnosis not present

## 2017-01-19 DIAGNOSIS — I2510 Atherosclerotic heart disease of native coronary artery without angina pectoris: Secondary | ICD-10-CM | POA: Diagnosis not present

## 2017-01-19 DIAGNOSIS — E559 Vitamin D deficiency, unspecified: Secondary | ICD-10-CM | POA: Diagnosis not present

## 2017-01-19 MED ORDER — AMOXICILLIN-POT CLAVULANATE 875-125 MG PO TABS: 1.0000 | ORAL_TABLET | Freq: Two times a day (BID) | ORAL | 0 refills | Status: DC

## 2017-01-19 MED ORDER — LORAZEPAM 0.5 MG PO TABS: 0.2500 mg | ORAL_TABLET | Freq: Three times a day (TID) | ORAL | Status: DC | PRN

## 2017-01-19 NOTE — Addendum Note (Signed)
Addended by: Tonia Ghent on: 01/19/2017 05:26 AM   Modules accepted: Orders

## 2017-01-19 NOTE — Patient Instructions (Signed)
Okay to taper lorazepam by 1/2 tab at a time, cutting back every 3 days if needed.  Start augmentin.  Update me as needed.  Take care.  Glad to see you.  I'll await the follow up labs from home health.  Let me know if you have any troubles getting the order set up.

## 2017-01-19 NOTE — Progress Notes (Signed)
She is seen neurology in the meantime.  No seizures in the interval.  No seizures since hospitalization.  She is still been drowsy in the meantime.  She is slowly been tapered down to her current dose of lorazepam.  Her husband has been sick recently with similar symptoms.  She has had a cough, no known fevers.  The cough sounded wet.  She has had some nasal discharge.  No inc wob.    We talked about her recently normal GGT.  Husband agrees with plan for outpatient recheck of her alkaline phosphatase in a few weeks and he was given a written order for this to be done through home health.  He will update me if there is any difficulty getting the sample collected.  Meds, vitals, and allergies reviewed.   ROS: Per HPI unless specifically indicated in ROS section   Nad, drowsy but follows commands and her speech is at baseline Tm wnl B Nasal exam stuffy OP wnl, MMM Maxillary sinuses ttp B Neck supple, no LA rrr Occ cough noted but ctab.  No focal dec in BS, no wheeze. No inc wob abd soft, not ttp, normal BS Ext w/o edema.  Not jaundiced.

## 2017-01-19 NOTE — Telephone Encounter (Addendum)
Please check with HH to see if they can draw CMET in about 3 weeks.   Dx R74.8.   Would cut total daily dose of lorazepam back by another 1/2 tab and update [place] in a few days.  Thanks.

## 2017-01-19 NOTE — Telephone Encounter (Signed)
Husband advised. 

## 2017-01-21 DIAGNOSIS — J0100 Acute maxillary sinusitis, unspecified: Secondary | ICD-10-CM | POA: Insufficient documentation

## 2017-01-21 NOTE — Assessment & Plan Note (Signed)
Presumed, still okay for outpatient follow-up with support and monitoring from husband and home aids.  Start augmentin.  Okay to taper lorazepam by 1/2 tab at a time, cutting back every 3 days if needed.  Update me as needed.  Husband agrees.

## 2017-01-21 NOTE — Assessment & Plan Note (Signed)
We talked about her recently normal GGT.  Husband agrees with plan for outpatient recheck of her alkaline phosphatase in a few weeks and he was given a written order for this to be done through home health.  He will update me if there is any difficulty getting the sample collected.

## 2017-01-22 ENCOUNTER — Telehealth: Payer: Self-pay

## 2017-01-22 NOTE — Telephone Encounter (Signed)
Husband says he thinks the patient is doing some better.  Yesterday, she didn't seem quite so sleepy all day but still doesn't feel like doing anything so home PT, etc is quite challenging.  Husband states they are both still getting over their colds but he believes the ABX she was given is helping but she is still coughing a fair amount.

## 2017-01-22 NOTE — Telephone Encounter (Signed)
PLEASE NOTE: All timestamps contained within this report are represented as [place] Standard Time. CONFIDENTIALTY NOTICE: This fax transmission is intended only for the addressee. It contains information that is legally privileged, confidential or otherwise protected from use or disclosure. If you are not the intended recipient, you are strictly prohibited from reviewing, disclosing, copying using or disseminating any of this information or taking any action in reliance on or regarding this information. If you have received this fax in error, please notify us immediately by telephone so that we can arrange for its return to [place]. Phone: [phone], Toll-Free: [phone], Fax: [phone] Page: 1 of 2 Call Id: [id number] [place] Patient Name: Lisa Mclaughlin Gender: Female DOB: Dec 17, 1950 Age: 67 Y 11 M 13 D Return Phone Number: [phone] (Primary) Address: City/State/Zip: [place] [address] Client [place] Night - Client Client Site [place] Physician Renford Dills - MD Contact Type Call Who Is Calling Patient / Member / Family / Caregiver Call Type Triage / Clinical Caller Name Domingo Cocking Relationship To Patient Spouse Return Phone Number [phone] (Primary) Chief Complaint Cold Symptom Reason for Call Symptomatic / Request for Health Information Initial Comment Pt has a chest cold was given meds still feeling this way Translation No Nurse Assessment Nurse: Rene Kocher, RN, [place] Date/Time (Eastern Time): 01/20/2017 11:47:27 AM Confirm and document reason for call. If symptomatic, describe symptoms. ---caller states that his wife has Alzheimer's and had 2 seizures around Christmas and has developed a cold. pt was seen in office yesterday and was prescribed an antibiotic. lack of appetite. denies fever. chest congestion. unproductive  cough. Does the patient have any new or worsening symptoms? ---Yes Will a triage be completed? ---Yes Related visit to physician within the last 2 weeks? ---Yes Does the PT have any chronic conditions? (i.e. diabetes, asthma, etc.) ---Yes List chronic conditions. 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 ---No Guidelines Guideline Title Affirmed Question Affirmed Notes Nurse Date/Time Eilene Ghazi Time) Sinus Infection on Antibiotic Follow-up Call [1] Taking antibiotic AND [2] nose still blocked Coulter, RN, [facility] 01/20/2017 11:56:31 AM Disp. Time Eilene Ghazi Time) Disposition Final User 01/20/2017 11:32:19 AM Attempt made - message left Rene Kocher, RN, [facility] 01/20/2017 12:10:08 PM [place], RN, [place] PLEASE NOTE: All timestamps contained within this report are represented as [place] Standard Time. CONFIDENTIALTY NOTICE: This fax transmission is intended only for the addressee. It contains information that is legally privileged, confidential or otherwise protected from use or disclosure. If you are not the intended recipient, you are strictly prohibited from reviewing, disclosing, copying using or disseminating any of this information or taking any action in reliance on or regarding this information. If you have received this fax in error, please notify us immediately by telephone so that we can arrange for its return to [place]. Phone: [phone], Toll-Free: [phone], Fax: [phone] Page: 2 of 2 Call Id: [id number] [place] Disagree/Comply Comply Caller Understands Yes PreDisposition Call Doctor Care Advice Given Per Guideline HOME CARE: You should be able to treat this at home. * Introduction: Saline (salt water) nasal irrigation (nasal wash) is an effective and simple home remedy for treating stuffy nose and sinus congestion. The nose can be irrigated by pouring, spraying, or squirting salt water into the nose and then  letting it run back out. ANTIBIOTIC: Continue taking the prescribed antibiotic. * Difficulty breathing (and  not relieved by cleaning out nose) * Fever lasts over 2 days on antibiotics * Symptoms don't improve by day 4 on antibiotics * You become worse. CARE ADVICE given per Sinus Infection on Antibiotic Follow-Up Call (Adult) guideline. CALL BACK IF:

## 2017-01-22 NOTE — Telephone Encounter (Signed)
Noted, thanks, I would give this a little more time and update me as needed.

## 2017-01-22 NOTE — Telephone Encounter (Signed)
Please get update on patient.  Thanks. 

## 2017-01-23 DIAGNOSIS — M810 Age-related osteoporosis without current pathological fracture: Secondary | ICD-10-CM | POA: Diagnosis not present

## 2017-01-23 DIAGNOSIS — E785 Hyperlipidemia, unspecified: Secondary | ICD-10-CM | POA: Diagnosis not present

## 2017-01-23 DIAGNOSIS — K589 Irritable bowel syndrome without diarrhea: Secondary | ICD-10-CM | POA: Diagnosis not present

## 2017-01-23 DIAGNOSIS — G309 Alzheimer's disease, unspecified: Secondary | ICD-10-CM | POA: Diagnosis not present

## 2017-01-23 DIAGNOSIS — I2510 Atherosclerotic heart disease of native coronary artery without angina pectoris: Secondary | ICD-10-CM | POA: Diagnosis not present

## 2017-01-23 DIAGNOSIS — Z7982 Long term (current) use of aspirin: Secondary | ICD-10-CM | POA: Diagnosis not present

## 2017-01-23 DIAGNOSIS — E559 Vitamin D deficiency, unspecified: Secondary | ICD-10-CM | POA: Diagnosis not present

## 2017-01-23 DIAGNOSIS — Z87891 Personal history of nicotine dependence: Secondary | ICD-10-CM | POA: Diagnosis not present

## 2017-01-24 DIAGNOSIS — E559 Vitamin D deficiency, unspecified: Secondary | ICD-10-CM | POA: Diagnosis not present

## 2017-01-24 DIAGNOSIS — M810 Age-related osteoporosis without current pathological fracture: Secondary | ICD-10-CM | POA: Diagnosis not present

## 2017-01-24 DIAGNOSIS — K589 Irritable bowel syndrome without diarrhea: Secondary | ICD-10-CM | POA: Diagnosis not present

## 2017-01-24 DIAGNOSIS — Z87891 Personal history of nicotine dependence: Secondary | ICD-10-CM | POA: Diagnosis not present

## 2017-01-24 DIAGNOSIS — G309 Alzheimer's disease, unspecified: Secondary | ICD-10-CM | POA: Diagnosis not present

## 2017-01-24 DIAGNOSIS — E785 Hyperlipidemia, unspecified: Secondary | ICD-10-CM | POA: Diagnosis not present

## 2017-01-24 DIAGNOSIS — I2510 Atherosclerotic heart disease of native coronary artery without angina pectoris: Secondary | ICD-10-CM | POA: Diagnosis not present

## 2017-01-24 DIAGNOSIS — Z7982 Long term (current) use of aspirin: Secondary | ICD-10-CM | POA: Diagnosis not present

## 2017-01-25 DIAGNOSIS — Z87891 Personal history of nicotine dependence: Secondary | ICD-10-CM | POA: Diagnosis not present

## 2017-01-25 DIAGNOSIS — M810 Age-related osteoporosis without current pathological fracture: Secondary | ICD-10-CM | POA: Diagnosis not present

## 2017-01-25 DIAGNOSIS — I2510 Atherosclerotic heart disease of native coronary artery without angina pectoris: Secondary | ICD-10-CM | POA: Diagnosis not present

## 2017-01-25 DIAGNOSIS — K589 Irritable bowel syndrome without diarrhea: Secondary | ICD-10-CM | POA: Diagnosis not present

## 2017-01-25 DIAGNOSIS — E785 Hyperlipidemia, unspecified: Secondary | ICD-10-CM | POA: Diagnosis not present

## 2017-01-25 DIAGNOSIS — E559 Vitamin D deficiency, unspecified: Secondary | ICD-10-CM | POA: Diagnosis not present

## 2017-01-25 DIAGNOSIS — G309 Alzheimer's disease, unspecified: Secondary | ICD-10-CM | POA: Diagnosis not present

## 2017-01-25 DIAGNOSIS — Z7982 Long term (current) use of aspirin: Secondary | ICD-10-CM | POA: Diagnosis not present

## 2017-01-26 DIAGNOSIS — Z87891 Personal history of nicotine dependence: Secondary | ICD-10-CM | POA: Diagnosis not present

## 2017-01-26 DIAGNOSIS — Z7982 Long term (current) use of aspirin: Secondary | ICD-10-CM | POA: Diagnosis not present

## 2017-01-26 DIAGNOSIS — I2510 Atherosclerotic heart disease of native coronary artery without angina pectoris: Secondary | ICD-10-CM | POA: Diagnosis not present

## 2017-01-26 DIAGNOSIS — K589 Irritable bowel syndrome without diarrhea: Secondary | ICD-10-CM | POA: Diagnosis not present

## 2017-01-26 DIAGNOSIS — M810 Age-related osteoporosis without current pathological fracture: Secondary | ICD-10-CM | POA: Diagnosis not present

## 2017-01-26 DIAGNOSIS — G309 Alzheimer's disease, unspecified: Secondary | ICD-10-CM | POA: Diagnosis not present

## 2017-01-26 DIAGNOSIS — E559 Vitamin D deficiency, unspecified: Secondary | ICD-10-CM | POA: Diagnosis not present

## 2017-01-26 DIAGNOSIS — E785 Hyperlipidemia, unspecified: Secondary | ICD-10-CM | POA: Diagnosis not present

## 2017-01-29 ENCOUNTER — Telehealth: Payer: Self-pay | Admitting: Family Medicine

## 2017-01-29 DIAGNOSIS — I2510 Atherosclerotic heart disease of native coronary artery without angina pectoris: Secondary | ICD-10-CM | POA: Diagnosis not present

## 2017-01-29 DIAGNOSIS — Z87891 Personal history of nicotine dependence: Secondary | ICD-10-CM | POA: Diagnosis not present

## 2017-01-29 DIAGNOSIS — E559 Vitamin D deficiency, unspecified: Secondary | ICD-10-CM | POA: Diagnosis not present

## 2017-01-29 DIAGNOSIS — M810 Age-related osteoporosis without current pathological fracture: Secondary | ICD-10-CM | POA: Diagnosis not present

## 2017-01-29 DIAGNOSIS — E785 Hyperlipidemia, unspecified: Secondary | ICD-10-CM | POA: Diagnosis not present

## 2017-01-29 DIAGNOSIS — G309 Alzheimer's disease, unspecified: Secondary | ICD-10-CM | POA: Diagnosis not present

## 2017-01-29 DIAGNOSIS — Z7982 Long term (current) use of aspirin: Secondary | ICD-10-CM | POA: Diagnosis not present

## 2017-01-29 DIAGNOSIS — K589 Irritable bowel syndrome without diarrhea: Secondary | ICD-10-CM | POA: Diagnosis not present

## 2017-01-29 NOTE — Telephone Encounter (Signed)
Copied from [place] [phone]. Topic: Inquiry >> Jan 29, 2017  2:25 PM Conception Chancy, NT wrote: Lisa Mclaughlin is calling from [facility] and wants to give update on patient, states pt has had diarrhea several days and has been on antibiotic and finished last night. He anticipates on diarrhea stopping. He understand there has been a change in medication for lorazepam and he would like a fax update on the new dosage.   Fax [phone] CB [phone]

## 2017-01-31 DIAGNOSIS — E559 Vitamin D deficiency, unspecified: Secondary | ICD-10-CM | POA: Diagnosis not present

## 2017-01-31 DIAGNOSIS — Z7982 Long term (current) use of aspirin: Secondary | ICD-10-CM | POA: Diagnosis not present

## 2017-01-31 DIAGNOSIS — I2510 Atherosclerotic heart disease of native coronary artery without angina pectoris: Secondary | ICD-10-CM | POA: Diagnosis not present

## 2017-01-31 DIAGNOSIS — M810 Age-related osteoporosis without current pathological fracture: Secondary | ICD-10-CM | POA: Diagnosis not present

## 2017-01-31 DIAGNOSIS — Z87891 Personal history of nicotine dependence: Secondary | ICD-10-CM | POA: Diagnosis not present

## 2017-01-31 DIAGNOSIS — E785 Hyperlipidemia, unspecified: Secondary | ICD-10-CM | POA: Diagnosis not present

## 2017-01-31 DIAGNOSIS — K589 Irritable bowel syndrome without diarrhea: Secondary | ICD-10-CM | POA: Diagnosis not present

## 2017-01-31 DIAGNOSIS — G309 Alzheimer's disease, unspecified: Secondary | ICD-10-CM | POA: Diagnosis not present

## 2017-02-01 ENCOUNTER — Telehealth: Payer: Self-pay | Admitting: Family Medicine

## 2017-02-01 NOTE — Telephone Encounter (Signed)
Please give the order.  Thanks.   

## 2017-02-01 NOTE — Telephone Encounter (Signed)
Left detailed message on voicemail.  

## 2017-02-01 NOTE — Telephone Encounter (Signed)
Copied from [place] [id number]. Topic: Quick Communication - See Telephone Encounter >> Feb 01, 2017  1:15 PM Synthia Innocent wrote: CRM for notification. See Telephone encounter for: Requesting verbal orders for PT, 1x a week for 3 weeks, this will included the visit that was missed  02/01/17.

## 2017-02-05 ENCOUNTER — Telehealth: Payer: Self-pay | Admitting: Family Medicine

## 2017-02-05 DIAGNOSIS — E559 Vitamin D deficiency, unspecified: Secondary | ICD-10-CM | POA: Diagnosis not present

## 2017-02-05 DIAGNOSIS — Z87891 Personal history of nicotine dependence: Secondary | ICD-10-CM | POA: Diagnosis not present

## 2017-02-05 DIAGNOSIS — Z7982 Long term (current) use of aspirin: Secondary | ICD-10-CM | POA: Diagnosis not present

## 2017-02-05 DIAGNOSIS — M810 Age-related osteoporosis without current pathological fracture: Secondary | ICD-10-CM | POA: Diagnosis not present

## 2017-02-05 DIAGNOSIS — E785 Hyperlipidemia, unspecified: Secondary | ICD-10-CM | POA: Diagnosis not present

## 2017-02-05 DIAGNOSIS — G309 Alzheimer's disease, unspecified: Secondary | ICD-10-CM | POA: Diagnosis not present

## 2017-02-05 DIAGNOSIS — K589 Irritable bowel syndrome without diarrhea: Secondary | ICD-10-CM | POA: Diagnosis not present

## 2017-02-05 DIAGNOSIS — I2510 Atherosclerotic heart disease of native coronary artery without angina pectoris: Secondary | ICD-10-CM | POA: Diagnosis not present

## 2017-02-05 NOTE — Telephone Encounter (Signed)
Copied from [place] [phone]. Topic: General - Other >> Feb 05, 2017  2:57 PM Darl Householder, RMA wrote: Reason for CRM: Clair Gulling from [facility] is calling to notify Dr. Damita Dunnings that pt today had loose bowels and is slowly returning back to normal in bowel movements after stopping antibiotics last week, pt normal oxygen is 93% and that is after rest climbing down stairs and walking, however climbing upstair that is 15 steps pt dropped to 84% and returned to normal with 30 seconds while sitting

## 2017-02-06 NOTE — Telephone Encounter (Signed)
Left detailed message on voicemail of Lisa Mclaughlin with [facility] and spoke with husband who says she is breathing fine and does get "winded" with exertion but is getting back to her previous level before the seizure.

## 2017-02-06 NOTE — Telephone Encounter (Signed)
Is she having O2 desats below 90% o/w? How is her breathing- is that any better than prev? I'm hesitant to put her on O2 if not consistently needed due to the fall risk with the cord.   Let me know.  Thanks.

## 2017-02-07 ENCOUNTER — Encounter: Payer: Self-pay | Admitting: Family Medicine

## 2017-02-07 NOTE — Telephone Encounter (Signed)
If O2 stats at rest and shortly after exercise are >90, then I wouldn't change anything (especially if she is getting better).  Thanks.

## 2017-02-07 NOTE — Telephone Encounter (Signed)
Noted. Thanks. No new orders.

## 2017-02-07 NOTE — Telephone Encounter (Signed)
Left message on voicemail for Clair Gulling to call back.

## 2017-02-07 NOTE — Telephone Encounter (Signed)
This encounter was created in error - please disregard.

## 2017-02-07 NOTE — Telephone Encounter (Signed)
Herbert Deaner, PT with [facility] called to say that the patient is not needing oxygen at this time. He says at rest her O2 sats are around 94% and remains the same during physical therapy. He said the only time she dropped was going up stairs.

## 2017-02-11 ENCOUNTER — Other Ambulatory Visit: Payer: Self-pay | Admitting: Neurology

## 2017-02-13 DIAGNOSIS — I2510 Atherosclerotic heart disease of native coronary artery without angina pectoris: Secondary | ICD-10-CM | POA: Diagnosis not present

## 2017-02-13 DIAGNOSIS — E559 Vitamin D deficiency, unspecified: Secondary | ICD-10-CM | POA: Diagnosis not present

## 2017-02-13 DIAGNOSIS — Z87891 Personal history of nicotine dependence: Secondary | ICD-10-CM | POA: Diagnosis not present

## 2017-02-13 DIAGNOSIS — G309 Alzheimer's disease, unspecified: Secondary | ICD-10-CM | POA: Diagnosis not present

## 2017-02-13 DIAGNOSIS — Z7982 Long term (current) use of aspirin: Secondary | ICD-10-CM | POA: Diagnosis not present

## 2017-02-13 DIAGNOSIS — K589 Irritable bowel syndrome without diarrhea: Secondary | ICD-10-CM | POA: Diagnosis not present

## 2017-02-13 DIAGNOSIS — M810 Age-related osteoporosis without current pathological fracture: Secondary | ICD-10-CM | POA: Diagnosis not present

## 2017-02-13 DIAGNOSIS — E785 Hyperlipidemia, unspecified: Secondary | ICD-10-CM | POA: Diagnosis not present

## 2017-02-18 ENCOUNTER — Other Ambulatory Visit: Payer: Self-pay | Admitting: Neurology

## 2017-02-21 DIAGNOSIS — E785 Hyperlipidemia, unspecified: Secondary | ICD-10-CM | POA: Diagnosis not present

## 2017-02-21 DIAGNOSIS — M810 Age-related osteoporosis without current pathological fracture: Secondary | ICD-10-CM | POA: Diagnosis not present

## 2017-02-21 DIAGNOSIS — K589 Irritable bowel syndrome without diarrhea: Secondary | ICD-10-CM | POA: Diagnosis not present

## 2017-02-21 DIAGNOSIS — Z7982 Long term (current) use of aspirin: Secondary | ICD-10-CM | POA: Diagnosis not present

## 2017-02-21 DIAGNOSIS — G309 Alzheimer's disease, unspecified: Secondary | ICD-10-CM | POA: Diagnosis not present

## 2017-02-21 DIAGNOSIS — I2510 Atherosclerotic heart disease of native coronary artery without angina pectoris: Secondary | ICD-10-CM | POA: Diagnosis not present

## 2017-02-21 DIAGNOSIS — Z87891 Personal history of nicotine dependence: Secondary | ICD-10-CM | POA: Diagnosis not present

## 2017-02-21 DIAGNOSIS — E559 Vitamin D deficiency, unspecified: Secondary | ICD-10-CM | POA: Diagnosis not present

## 2017-03-08 ENCOUNTER — Telehealth: Payer: Self-pay | Admitting: Family Medicine

## 2017-03-08 NOTE — Telephone Encounter (Signed)
Copied from [place] [id number]. Topic: Quick Communication - See Telephone Encounter >> Mar 08, 2017  9:01 AM Ether Griffins B wrote: CRM for notification. See Telephone encounter for:  Curt Bears OT with [facility] requesting verbal order for 1x 1 week OT reevaluation. Call back number [phone].  03/08/17.

## 2017-03-09 DIAGNOSIS — K589 Irritable bowel syndrome without diarrhea: Secondary | ICD-10-CM | POA: Diagnosis not present

## 2017-03-09 DIAGNOSIS — Z7982 Long term (current) use of aspirin: Secondary | ICD-10-CM | POA: Diagnosis not present

## 2017-03-09 DIAGNOSIS — M810 Age-related osteoporosis without current pathological fracture: Secondary | ICD-10-CM | POA: Diagnosis not present

## 2017-03-09 DIAGNOSIS — G309 Alzheimer's disease, unspecified: Secondary | ICD-10-CM | POA: Diagnosis not present

## 2017-03-09 DIAGNOSIS — Z87891 Personal history of nicotine dependence: Secondary | ICD-10-CM | POA: Diagnosis not present

## 2017-03-09 DIAGNOSIS — E559 Vitamin D deficiency, unspecified: Secondary | ICD-10-CM | POA: Diagnosis not present

## 2017-03-09 DIAGNOSIS — I2510 Atherosclerotic heart disease of native coronary artery without angina pectoris: Secondary | ICD-10-CM | POA: Diagnosis not present

## 2017-03-09 DIAGNOSIS — E785 Hyperlipidemia, unspecified: Secondary | ICD-10-CM | POA: Diagnosis not present

## 2017-03-09 NOTE — Telephone Encounter (Signed)
Verbal order given to [place] with [facility].

## 2017-03-09 NOTE — Telephone Encounter (Signed)
Please give the order.  Thanks.   

## 2017-04-09 ENCOUNTER — Telehealth: Payer: Self-pay

## 2017-04-09 NOTE — Telephone Encounter (Signed)
Spoke with Mr Lenig, patient's husband, in regards to referral for neuro clinic at [facility] to follow up. Mr Lumbert states that patient has declined significantly since her seizure in December 2018 and since him and I spoke in February. Patient is completely incontinent at this time also. Mr Hussein does not think that going to [facility] clinic would be helpful at this time. They also moved in March and maybe this added to her declining, she is very agitated a lot too. He is trying to figure out the next step of what to do. It is beginning to get harder for Mr Bennett to take care of Mrs Thobe. They just buried his father 10 days ago also (patient's husband) so have had a lot going on but Mr Wiedman said he would let us know what is decided and what is needed. He said thank you for the services we have provided to them all this time. -Kris Mouton, RMA

## 2017-04-19 NOTE — Telephone Encounter (Signed)
Noted. Thanks.

## 2017-05-16 ENCOUNTER — Other Ambulatory Visit: Payer: Self-pay | Admitting: Family Medicine

## 2017-05-16 NOTE — Telephone Encounter (Signed)
Electronic refill request Last office visit 01/19/17 Last refill 11/06/16 #90/1

## 2017-05-16 NOTE — Telephone Encounter (Signed)
Sent. Thanks.  Please get update on patient and her husband- the husband's father died this year. Let me know if I can be of service.

## 2017-05-17 NOTE — Telephone Encounter (Signed)
Spoke with patient's husband who states that he wants to apologize for not getting back with you but that he has had a great deal of stuff going on and his time has been limited.  Patient has went down considerably since her last visit with you.  She is now no longer continent.  Husband says that he has had to pull back on the plan for her to be seen in [facility] because of her steady decline and the problems with continence (she has had a few accidents in public and it has been difficult to manage her in a public restroom).  The husband's main question is:  One of his caregivers has a child that is on [place] and she has told him that they have to get labs done every 3 months.  Husband says that Conchita has been on [place] since around Christmas and he wonders if she needs labs?  He also understands that the caregiver's child may be on the medication for a different reason that Shavelle but it did make him wonder if anything should be done.  Husband says he is looking into a memory care facility and is working out a Midwife, hoping to get her into a facility within the next 3-6 months.

## 2017-05-18 ENCOUNTER — Ambulatory Visit: Payer: Medicare Other | Admitting: Neurology

## 2017-05-18 NOTE — Telephone Encounter (Signed)
The only lab I see as outstanding was the f/u LFTs due to prev alk phos elevation.  I don't see where that had been done.  That f/u lab set wasn't due to keppra and if the patient's dementia prevents those labs from being collected, then it may make the most sense to defer it.    And I don't expect her husband to need to apologize for anything as he has put forth tremendous effort to care for his wife.  And I thank him.

## 2017-06-20 ENCOUNTER — Other Ambulatory Visit: Payer: Self-pay | Admitting: Cardiology

## 2017-06-22 ENCOUNTER — Encounter: Payer: Self-pay | Admitting: Family Medicine

## 2017-06-22 ENCOUNTER — Ambulatory Visit (INDEPENDENT_AMBULATORY_CARE_PROVIDER_SITE_OTHER): Payer: Medicare Other | Admitting: Family Medicine

## 2017-06-22 DIAGNOSIS — G309 Alzheimer's disease, unspecified: Secondary | ICD-10-CM | POA: Diagnosis not present

## 2017-06-22 DIAGNOSIS — F0280 Dementia in other diseases classified elsewhere without behavioral disturbance: Secondary | ICD-10-CM

## 2017-06-22 NOTE — Patient Instructions (Signed)
Don't change your meds for now.  If you need a FL2 filled out, I can work on it.  Let me check with neuro and we'll be in touch.  Take care.  Glad to see you.

## 2017-06-24 ENCOUNTER — Telehealth: Payer: Self-pay | Admitting: Family Medicine

## 2017-06-24 NOTE — Telephone Encounter (Addendum)
Her dementia is clearly worsening and this isn't an acute change but a decline that has accelerated in the last few months.  I don't expect to find a reversible cause and it didn't make sense to put patient through a w/u that would likely be invasive and not useful- meaning we didn't do cath u/a, etc.    She'll need placement at some point and I can do the FL2 at that point.  She isn't using BZD at this point.  Still on keppra, donepezil, namenda, along with citalopram.    She is getting agitated and I need your input on med options for that.   I would normally try to avoid BZD but it may be the best option at this point.  I appreciate your input.   If you contact me I'll process the message. Otherwise please contact the patient's husband.   Many thanks.   Thanks.  Elsie Stain

## 2017-06-24 NOTE — Progress Notes (Signed)
Patient with progressive memory loss who needs constant care and supervision. Husband is near the point of needing to place patient in memory care, with plan to do so in the next few weeks.  Patient potentially wanders, gets more agitated, needs reassurance and redirection.  Still on baseline meds w/o SZ recently.  Noted that she isn't taking BZD now, no recent use.  She has personality changes, with dancing and saying "I love you" to others but then can change and get agitated w/o clear trigger known.  No recent med changes.  Husband was asking about options.  He has some help at home with hired help supervising patient.  Has episodic incontinence.    No recent somatic/systemic issues o/w, ie no fevers, etc.   PMH and SH reviewed  ROS: Per HPI unless specifically indicated in ROS section   Meds, vitals, and allergies reviewed.   nad but episodically dancing back and forth from one foot to the other with lack of awareness of medical visit.  Not at all clear that the patient recognized med but would episodically say "I love you" to me, without prompting and at atypical points in the conversation.  She tried to leave the exam room mult times and needed redirection.  Able to speak in complete sentences w/o tremor but not oriented to recent events.  Mmm Neck supple, no LA rrr ctab abd soft Ext w/o edema.

## 2017-06-24 NOTE — Assessment & Plan Note (Signed)
Clearly worsening, this isn't an acute change but a declined that has accelerated in the last few months.  I don't expect to find a reversible cause and it didn't make sense to put patient through a w/u that would likely be invasive and not useful- meaning we didn't do cath u/a, etc.  Husband agrees.  She'll need placement at some point and I can do the FL2 at that point.  She isn't using BZD at this point.  Still on keppra, donepezil, namenda, along with citalopram.  It may be the best option to use low dose of BZD for agitation when needed with the caution re: withdrawal and routine BZD cautions d/w pt husband.  I want neuro input first.  I didn't change meds at the [place].  D/w pt husband, he agrees.  >25 minutes spent in face to face time with patient, >50% spent in counselling or coordination of care.

## 2017-06-25 ENCOUNTER — Other Ambulatory Visit: Payer: Self-pay | Admitting: *Deleted

## 2017-06-25 MED ORDER — QUETIAPINE FUMARATE 25 MG PO TABS: ORAL_TABLET | ORAL | 5 refills | Status: DC

## 2017-06-25 NOTE — Telephone Encounter (Addendum)
Lisa Mclaughlin, please call patient and see if they would like to try a low-dose seroquel for agitation, side effects is sedation.  If so, please send prescription for seroquel 25mg  - take half tablet twice daily as needed for agitation.    Also, there is an opening in the schedule today, if they would like to be seen.  Semone Orlov K. Posey Pronto, DO

## 2017-06-25 NOTE — Telephone Encounter (Signed)
Left message for Lisa Mclaughlin to call me back.

## 2017-06-25 NOTE — Telephone Encounter (Signed)
I really appreciate neurology input and help.

## 2017-06-25 NOTE — Telephone Encounter (Signed)
I spoke with patient's husband and he would like for patient to try the medication but does not feel like she needs to be seen.  Rx sent in.

## 2017-07-03 ENCOUNTER — Telehealth: Payer: Self-pay | Admitting: Neurology

## 2017-07-03 NOTE — Telephone Encounter (Signed)
[name] said that [name] possibly caused seizures while she was in the hospital.  She has started hitting [name] and the caregivers and is agitated until she goes to bed but she does sleep through the night.  [name] is worried that he might lose his caregivers due to his wife's behavior.  Please advise.

## 2017-07-03 NOTE — Telephone Encounter (Signed)
We can check EKG and then decide whether it is safe to increase seroquel.  In the meantime, recommend increasing ativan to 1mg  twice daily as needed for agitation/anxiety.

## 2017-07-03 NOTE — Telephone Encounter (Signed)
Mr. Texidor given instructions per Dr. Posey Pronto.

## 2017-07-03 NOTE — Telephone Encounter (Signed)
Because of her QTc prolongation, will cannot increase her seroquel and instead will start [name] 5mg  daily as needed for agitation.

## 2017-07-03 NOTE — Telephone Encounter (Signed)
Please advise 

## 2017-07-04 ENCOUNTER — Telehealth: Payer: Self-pay | Admitting: Family Medicine

## 2017-07-04 ENCOUNTER — Other Ambulatory Visit: Payer: Self-pay | Admitting: *Deleted

## 2017-07-04 MED ORDER — LORAZEPAM 0.5 MG PO TABS: 1.0000 mg | ORAL_TABLET | Freq: Two times a day (BID) | ORAL | 5 refills | Status: DC

## 2017-07-04 NOTE — Telephone Encounter (Signed)
Pt has a 15 ' appt scheduled on 07/09/17 for med eval; can pt have EKG that day as well?

## 2017-07-04 NOTE — Telephone Encounter (Signed)
We can try and we should be able to do it.  The issue is if the patient is cooperative enough.  To be clear I don't fault the patient on this but I don't know how still she would be able to lay during the EKG.  We've done EKGs on many patients, a lot with dementia, and I have all confidence that [facility] should be able to do this (if anybody could).  Thanks.

## 2017-07-04 NOTE — Telephone Encounter (Signed)
Copied from [place] [id number]. Topic: Appointment Scheduling - Scheduling Inquiry for Clinic >> Jul 04, 2017  9:27 AM Mylinda Latina, NT wrote:  Reason for CRM: Patient husband called and states that her Neurologist Dr. Posey Pronto is needing an EKG to be done before medication can be prescribed and he is wondering if Dr. Damita Dunnings can do this . Please call patient if this can be done. She has an upcoming appt on 07/09/17. CB# [phone]

## 2017-07-04 NOTE — Telephone Encounter (Signed)
Patient's husband notified as instructed by telephone and verbalized understanding. Mr. Carrithers stated that he has some medication that calms the patient down and will make sure she takes one before the appointment with Dr. Damita Dunnings.

## 2017-07-09 ENCOUNTER — Encounter: Payer: Self-pay | Admitting: Family Medicine

## 2017-07-09 ENCOUNTER — Ambulatory Visit (INDEPENDENT_AMBULATORY_CARE_PROVIDER_SITE_OTHER): Payer: Medicare Other | Admitting: Family Medicine

## 2017-07-09 VITALS — BP 118/62 | HR 76 | Temp 98.0°F | Ht 61.0 in | Wt 137.5 lb

## 2017-07-09 DIAGNOSIS — I442 Atrioventricular block, complete: Secondary | ICD-10-CM | POA: Diagnosis not present

## 2017-07-09 DIAGNOSIS — R451 Restlessness and agitation: Secondary | ICD-10-CM | POA: Diagnosis not present

## 2017-07-09 MED ORDER — LORAZEPAM 0.5 MG PO TABS: 0.7500 mg | ORAL_TABLET | Freq: Two times a day (BID) | ORAL | Status: DC

## 2017-07-09 NOTE — Progress Notes (Signed)
Dementia with behavioral disturbance.  Higher dose of ativan helped with agitation and aggression.  She is more relaxed and more manageable.  She has some episodes where she "feels sorry" about herself.   It may be reasonable to decrease to 1.5 tabs of lorazepam twice a day, since the patient had been appearing slightly fatigued on the higher dose of lorazepam.  Not on seroquel currently, not in the last 5 days.  She is sleeping better with the current dose of meds.    Overall her condition appears to be improved on the higher dose of lorazepam and off Seroquel.  She needed follow-up EKG today to check her QT interval.  QT 398 off seroquel.    Meds, vitals, and allergies reviewed.   ROS: Per HPI unless specifically indicated in ROS section   nad She is able to follow one-step commands.  She appears more relaxed than at the last office visit.  Her speech is still fluent but she has difficulty following the conversation, at baseline.  She is able to sit in a chair and relax during our conversation.  She is not trying to get up and walk out of the room like she did at the last office visit. rrr cgab Ext w/o edema.

## 2017-07-09 NOTE — Patient Instructions (Addendum)
It may be reasonable to decrease to 1.5 tabs of lorazepam twice a day.  Stay off the seroquel for now.   I'll update the neurology clinic.  Update me and/or neurology as needed.  Take care.  Glad to see you.

## 2017-07-10 NOTE — Assessment & Plan Note (Signed)
Overall her condition appears to be improved on the higher dose of lorazepam and off Seroquel.  She needed follow-up EKG today to check her QT interval. QT 398 off seroquel.    I think it makes sense to continue as is with the possible exception of decreasing her lorazepam down to 1.5 tabs twice a day, down from 2 tabs twice a day.  Her husband will take care of dosing this and will update [place] as needed.  I think it makes sense to stay off Seroquel at this point.  I will route this over to neurology for input.  Appreciate the help of all involved.

## 2017-07-11 ENCOUNTER — Encounter: Payer: Self-pay | Admitting: Family Medicine

## 2017-07-12 ENCOUNTER — Encounter: Payer: Self-pay | Admitting: Family Medicine

## 2017-07-13 ENCOUNTER — Telehealth: Payer: Self-pay | Admitting: Family Medicine

## 2017-07-13 ENCOUNTER — Encounter: Payer: Self-pay | Admitting: Family Medicine

## 2017-07-13 ENCOUNTER — Other Ambulatory Visit: Payer: Self-pay | Admitting: Family Medicine

## 2017-07-13 MED ORDER — LORAZEPAM 0.5 MG PO TABS: 0.7700 mg | ORAL_TABLET | Freq: Two times a day (BID) | ORAL | Status: DC

## 2017-07-13 NOTE — Telephone Encounter (Signed)
FYI-  GSD.     This message is being sent by Christie Beckers on behalf of Khloe Hunkele:   The lower dose on the [place] seems to be doing OK. On short notice, due to an unexpected opening, this Monday we will be moving [place] to a Memory Care Unit (the [place] [facility]). I do need to request you fill out the FL-2 forms and return to me via email at your earliest convenience. I also am attaching another required form, a Physician's Supplement order which I presume you are familiar with.  Question # 4 relates to a TB test and you can leave that portion of the form blank. We are going to have it done at a [facility] near our apartment to save time and I am told they will give [place] a report we can attach. Please fill out the rest of the form, sign and return to me at your earliest convenience. I will keep you posted regarding next week.  If you could forward this message to [place] would greatly appreciate it. I will follow up with her next week as well.  Thank you very much! Crista Luria.

## 2017-07-17 ENCOUNTER — Encounter: Payer: Self-pay | Admitting: Family Medicine

## 2017-08-02 DIAGNOSIS — G300 Alzheimer's disease with early onset: Secondary | ICD-10-CM | POA: Diagnosis not present

## 2017-08-24 ENCOUNTER — Ambulatory Visit: Payer: Medicare Other | Admitting: Neurology

## 2017-08-30 DIAGNOSIS — G300 Alzheimer's disease with early onset: Secondary | ICD-10-CM | POA: Diagnosis not present

## 2017-09-27 DIAGNOSIS — G300 Alzheimer's disease with early onset: Secondary | ICD-10-CM | POA: Diagnosis not present

## 2017-09-28 DIAGNOSIS — Q845 Enlarged and hypertrophic nails: Secondary | ICD-10-CM | POA: Diagnosis not present

## 2017-09-28 DIAGNOSIS — B351 Tinea unguium: Secondary | ICD-10-CM | POA: Diagnosis not present

## 2017-09-28 DIAGNOSIS — I739 Peripheral vascular disease, unspecified: Secondary | ICD-10-CM | POA: Diagnosis not present

## 2017-09-28 DIAGNOSIS — L603 Nail dystrophy: Secondary | ICD-10-CM | POA: Diagnosis not present

## 2017-11-15 ENCOUNTER — Encounter: Payer: Self-pay | Admitting: Family Medicine

## 2018-02-01 DIAGNOSIS — Q845 Enlarged and hypertrophic nails: Secondary | ICD-10-CM | POA: Diagnosis not present

## 2018-05-28 ENCOUNTER — Encounter (HOSPITAL_COMMUNITY): Payer: Self-pay

## 2018-05-28 ENCOUNTER — Other Ambulatory Visit: Payer: Self-pay

## 2018-05-28 ENCOUNTER — Emergency Department (HOSPITAL_COMMUNITY)
Admission: EM | Admit: 2018-05-28 | Discharge: 2018-05-29 | Disposition: A | Discharge status: Home / Self Care | Payer: Medicare Other | Attending: Emergency Medicine | Admitting: Emergency Medicine

## 2018-05-28 DIAGNOSIS — F0390 Unspecified dementia without behavioral disturbance: Secondary | ICD-10-CM

## 2018-05-28 DIAGNOSIS — Z79899 Other long term (current) drug therapy: Secondary | ICD-10-CM | POA: Insufficient documentation

## 2018-05-28 DIAGNOSIS — I2510 Atherosclerotic heart disease of native coronary artery without angina pectoris: Secondary | ICD-10-CM | POA: Diagnosis not present

## 2018-05-28 DIAGNOSIS — E860 Dehydration: Secondary | ICD-10-CM | POA: Diagnosis not present

## 2018-05-28 DIAGNOSIS — Z7982 Long term (current) use of aspirin: Secondary | ICD-10-CM | POA: Insufficient documentation

## 2018-05-28 DIAGNOSIS — Z87891 Personal history of nicotine dependence: Secondary | ICD-10-CM | POA: Diagnosis not present

## 2018-05-28 DIAGNOSIS — I959 Hypotension, unspecified: Secondary | ICD-10-CM | POA: Diagnosis present

## 2018-05-28 DIAGNOSIS — U071 COVID-19: Secondary | ICD-10-CM | POA: Diagnosis not present

## 2018-05-28 DIAGNOSIS — G309 Alzheimer's disease, unspecified: Secondary | ICD-10-CM | POA: Diagnosis not present

## 2018-05-28 DIAGNOSIS — F0280 Dementia in other diseases classified elsewhere without behavioral disturbance: Secondary | ICD-10-CM | POA: Insufficient documentation

## 2018-05-28 DIAGNOSIS — R05 Cough: Secondary | ICD-10-CM | POA: Diagnosis not present

## 2018-05-28 DIAGNOSIS — R404 Transient alteration of awareness: Secondary | ICD-10-CM | POA: Diagnosis not present

## 2018-05-28 NOTE — ED Notes (Signed)
Bed: MC80 Expected date:  Expected time:  Means of arrival:  Comments: EMS 68yo covid + only one bp 99/67 all others good, poss dehydration

## 2018-05-28 NOTE — ED Notes (Signed)
This RN received call from telesitter. Pt managed to get out of bed. This RN and Ronalee Belts, RN went into room immediately and helped pt back into bed.

## 2018-05-28 NOTE — ED Provider Notes (Signed)
[facility] DEPT Provider Note   CSN: [id number] Arrival date & time: 05/28/18  1602    History   Chief Complaint Chief Complaint  Patient presents with  . Hypotension    HPI Lisa Mclaughlin is a 68 y.o. female.     HPI Patient with history of dementia.  Unable to contribute to history.  Level 5 caveat applies.  Coming from nursing home.  Tested positive for coronavirus yesterday.  Nursing staff noted blood pressure of 99/77 and called EMS.  When EMS arrived her blood pressure was 130/82.  Standing blood pressure was 132/80.  Patient is at her baseline mental status. Past Medical History:  Diagnosis Date  . ADD (attention deficit disorder with hyperactivity)    adult  . Alzheimer's disease, early onset ([place])   . Depression   . History of anemia   . Osteopenia 2002   dexa  . Osteoporosis   . Scoliosis   . Seizure [facility])     Patient Active Problem List   Diagnosis Date Noted  . Alkaline phosphatase elevation 01/19/2017  . Muscular deconditioning 01/08/2017  . Acute respiratory failure ([place])   . Attention deficit disorder   . Seizures ([place])   . Elevated blood pressure reading   . Leukocytosis   . Hypokalemia   . Supplemental oxygen dependent   . Status epilepticus ([place]) 12/26/2016  . Altered mental state   . Rash 12/17/2016  . Cough 05/22/2016  . Coronary artery disease involving native coronary artery of native heart with angina pectoris ([place]) 04/14/2016  . Dyspnea on exertion 01/28/2016  . Bradycardia 01/28/2016  . Colon cancer screening 01/17/2016  . Medicare welcome visit 11/20/2015  . Advance care planning 11/20/2015  . SOB (shortness of breath) on exertion 11/20/2015  . Agitation 10/16/2014  . Edema leg 07/09/2014  . Back pain 11/14/2012  . Hot flashes 05/24/2012  . Orthostasis 11/24/2011  . Dementia in Alzheimer's disease ([place]) 09/20/2011  . IBS (irritable bowel syndrome) 01/04/2011  . Vitamin D deficiency 10/04/2009  .  FATIGUE 03/15/2009  . POSTMENOPAUSAL BLEEDING 05/05/2008  . HYPERLIPIDEMIA 10/14/2007  . SCOLIOSIS 10/14/2007  . Depression with anxiety 07/11/2007  . OSTEOPOROSIS 07/11/2007    Past Surgical History:  Procedure Laterality Date  . BREAST ENHANCEMENT SURGERY    . CARDIAC EVENT MONITOR  02/2016   Mostly sinus rhythm. Lowest heart rate recorded was 50 BPM. No A. fib noted. Heart rate did achieve the rate of 110 bpm. Lightheadedness and dizziness was associated with mostly sinus rhythm with occasional PVCs or mild sinus tachycardia. No pauses greater than 3 seconds noted.   Marland Kitchen CESAREAN SECTION     x 3  . CORNARY CTA-FFR  03/31/2016   distal LAD stenosis with an FFR of 0.77. No left main, circumflex or RCA disease noted.. --> PLAN - medical management  . DILATION AND CURETTAGE OF UTERUS  2015  . NASAL SINUS SURGERY    . NM MYOVIEW LTD  06/2004   No infarct or Ischemia  . Pelvic ultrasound  05/10   thickened endometrium and ? polyp vs fibroid  . TRANSTHORACIC ECHOCARDIOGRAM  02/11/2016   Normal LV size and function. EF 60-65%. No comment on diastolic function or any significant valvular lesion.     OB History   No obstetric history on file.      Home Medications    Prior to Admission medications   Medication Sig Start Date End Date Taking? Authorizing Provider  aspirin 81 MG tablet  Take 1 tablet (81 mg total) by mouth daily. 04/14/16   Leonie Man, MD  carvedilol (COREG) 3.125 MG tablet TAKE 1 TABLET(3.125 MG) BY MOUTH TWICE DAILY 06/20/17   Leonie Man, MD  Cholecalciferol (VITAMIN D3) 5000 units CAPS Take 5,000 Units by mouth daily.     [provider]  citalopram (CELEXA) 40 MG tablet Take 1 tablet (40 mg total) by mouth daily. 01/05/17   Nita Sells, MD  donepezil (ARICEPT) 23 MG TABS tablet TAKE 1 TABLET(23 MG) BY MOUTH AT BEDTIME 02/12/17   Patel, Donika K, DO  furosemide (LASIX) 40 MG tablet Take 1 tablet (40 mg total) by mouth daily as needed for  fluid. 01/09/17   Tonia Ghent, MD  isosorbide mononitrate (IMDUR) 30 MG 24 hr tablet Hold as of 01/11/17. 01/11/17   Tonia Ghent, MD  levETIRAcetam (KEPPRA XR) 500 MG 24 hr tablet Take 2 tablets (1,000 mg total) by mouth daily. 01/17/17   Patel, Donika K, DO  LORazepam (ATIVAN) 0.5 MG tablet Take 1.5 tablets (0.75 mg total) by mouth 2 (two) times daily. 07/13/17   Tonia Ghent, MD  MELATONIN PO Take 8 mg by mouth at bedtime.    [provider]  memantine (NAMENDA) 10 MG tablet TAKE 1 TABLET(10 MG) BY MOUTH TWICE DAILY 02/19/17   Patel, Arvin Collard K, DO  Multiple Vitamins-Minerals (HIGH POTENCY MULTIVIT/MIN/IRON) TABS Take 1 tablet by mouth daily.     [provider]  nitroGLYCERIN (NITROSTAT) 0.4 MG SL tablet Place 1 tablet (0.4 mg total) under the tongue every 5 (five) minutes as needed for chest pain. 08/04/16 06/22/17  Leonie Man, MD    Family History Family History  Problem Relation Age of Onset  . Heart disease Mother        CAD  . Alzheimer's disease Mother        Died, 30  . Heart disease Father         CAD MI at age 69  . Hypertension Father   . Hypertension Sister   . Cancer Other        leukemia  . Diabetes Sister   . Cancer Other        breast  . Breast cancer Other   . Colon cancer Neg Hx     Social History Social History   Tobacco Use  . Smoking status: Former Smoker    Last attempt to quit: 09/27/1979    Years since quitting: 38.6  . Smokeless tobacco: Never Used  Substance Use Topics  . Alcohol use: Yes    Alcohol/week: 0.0 standard drinks    Comment: occassionally  . Drug use: No     Allergies   [name] [aripiprazole]; Wellbutrin [bupropion]; Gluten meal; Other; and Tetracycline hcl   Review of Systems Review of Systems  Unable to perform ROS: Dementia     Physical Exam Updated Vital Signs BP (!) 143/116   Pulse (!) 59   Temp 98.4 F (36.9 C) (Oral)   Resp 16   SpO2 97%   Physical Exam Vitals signs and nursing  note reviewed.  Constitutional:      Appearance: She is well-developed.  HENT:     Head: Normocephalic and atraumatic.  Eyes:     Pupils: Pupils are equal, round, and reactive to light.  Neck:     Musculoskeletal: Normal range of motion and neck supple.  Cardiovascular:     Rate and Rhythm: Normal rate and regular rhythm.  Pulmonary:     Effort: Pulmonary effort is normal. No respiratory distress.     Breath sounds: Normal breath sounds. No wheezing or rhonchi.  Abdominal:     General: Bowel sounds are normal.     Palpations: Abdomen is soft.     Tenderness: There is no abdominal tenderness. There is no guarding or rebound.  Musculoskeletal: Normal range of motion.        General: No swelling, tenderness, deformity or signs of injury.     Right lower leg: No edema.     Left lower leg: No edema.  Skin:    General: Skin is warm and dry.     Findings: No erythema or rash.  Neurological:     General: No focal deficit present.     Mental Status: She is alert.     Comments: Alert.  Psychiatric:        Behavior: Behavior normal.      ED Treatments / Results  Labs (all labs ordered are listed, but only abnormal results are displayed) Labs Reviewed - No data to display  EKG None  Radiology No results found.  Procedures Procedures (including critical care time)  Medications Ordered in ED Medications - No data to display   Initial Impression / Assessment and Plan / ED Course  I have reviewed the triage vital signs and the nursing notes.  Pertinent labs & imaging results that were available during my care of the patient were reviewed by me and considered in my medical decision making (see chart for details).        Patient is well-appearing.  No respiratory distress.  Vital signs are normal.  She is maintaining saturations in the mid to high 90s on room air.  Blood pressure is consistently stable in the ability emergency department.  Do not believe that further  work-up is warranted at this point.  Final Clinical Impressions(s) / ED Diagnoses   Final diagnoses:  Dementia without behavioral disturbance, unspecified dementia type [facility])    ED Discharge Orders    None       Julianne Rice, MD 05/28/18 [phone]

## 2018-05-28 NOTE — ED Notes (Signed)
Pt found wandering the halls. This RN and Apolonio Schneiders NT guided pt back to room . This RN has been sitting in room since with pt waiting for telesitter machine to work.

## 2018-05-28 NOTE — ED Notes (Signed)
[facility] called for transport.  

## 2018-05-28 NOTE — ED Notes (Signed)
Telepsych machine active in room. RN spent total of 20 minutes in pt room.

## 2018-05-28 NOTE — ED Notes (Signed)
[facility] called  

## 2018-05-28 NOTE — ED Triage Notes (Addendum)
EMS reports from [facility], staff called for "hypotensive", reported 1 BP of 99/77, no other complaints. Upon EMS arrival BP 130/82, and also did orthostatic standing BP of 132/80 to confirm. EMS reports facility staff received positive result from Covid send out test today. Pt presents afebrile, no SOB, no cough, not hypotensive denies pain and with no outwardly symptoms. Pt Hx of dementia

## 2018-05-28 NOTE — ED Notes (Signed)
[facility] called for update, they said patient is next to be transported

## 2018-05-29 DIAGNOSIS — U071 COVID-19: Secondary | ICD-10-CM | POA: Diagnosis not present

## 2018-05-29 DIAGNOSIS — Z7401 Bed confinement status: Secondary | ICD-10-CM | POA: Diagnosis not present

## 2018-05-29 DIAGNOSIS — R0902 Hypoxemia: Secondary | ICD-10-CM | POA: Diagnosis not present

## 2018-05-29 DIAGNOSIS — M2550 Pain in unspecified joint: Secondary | ICD-10-CM | POA: Diagnosis not present

## 2018-05-29 NOTE — ED Notes (Signed)
[facility] at bedside 

## 2018-09-29 IMAGING — DX DG CHEST 2V
2 series · 2 of 2 positions shown · non-contrast
Comparison: None.

CLINICAL DATA: Shortness of breath with exertion.

EXAM:
CHEST  2 VIEW

[chest pa]
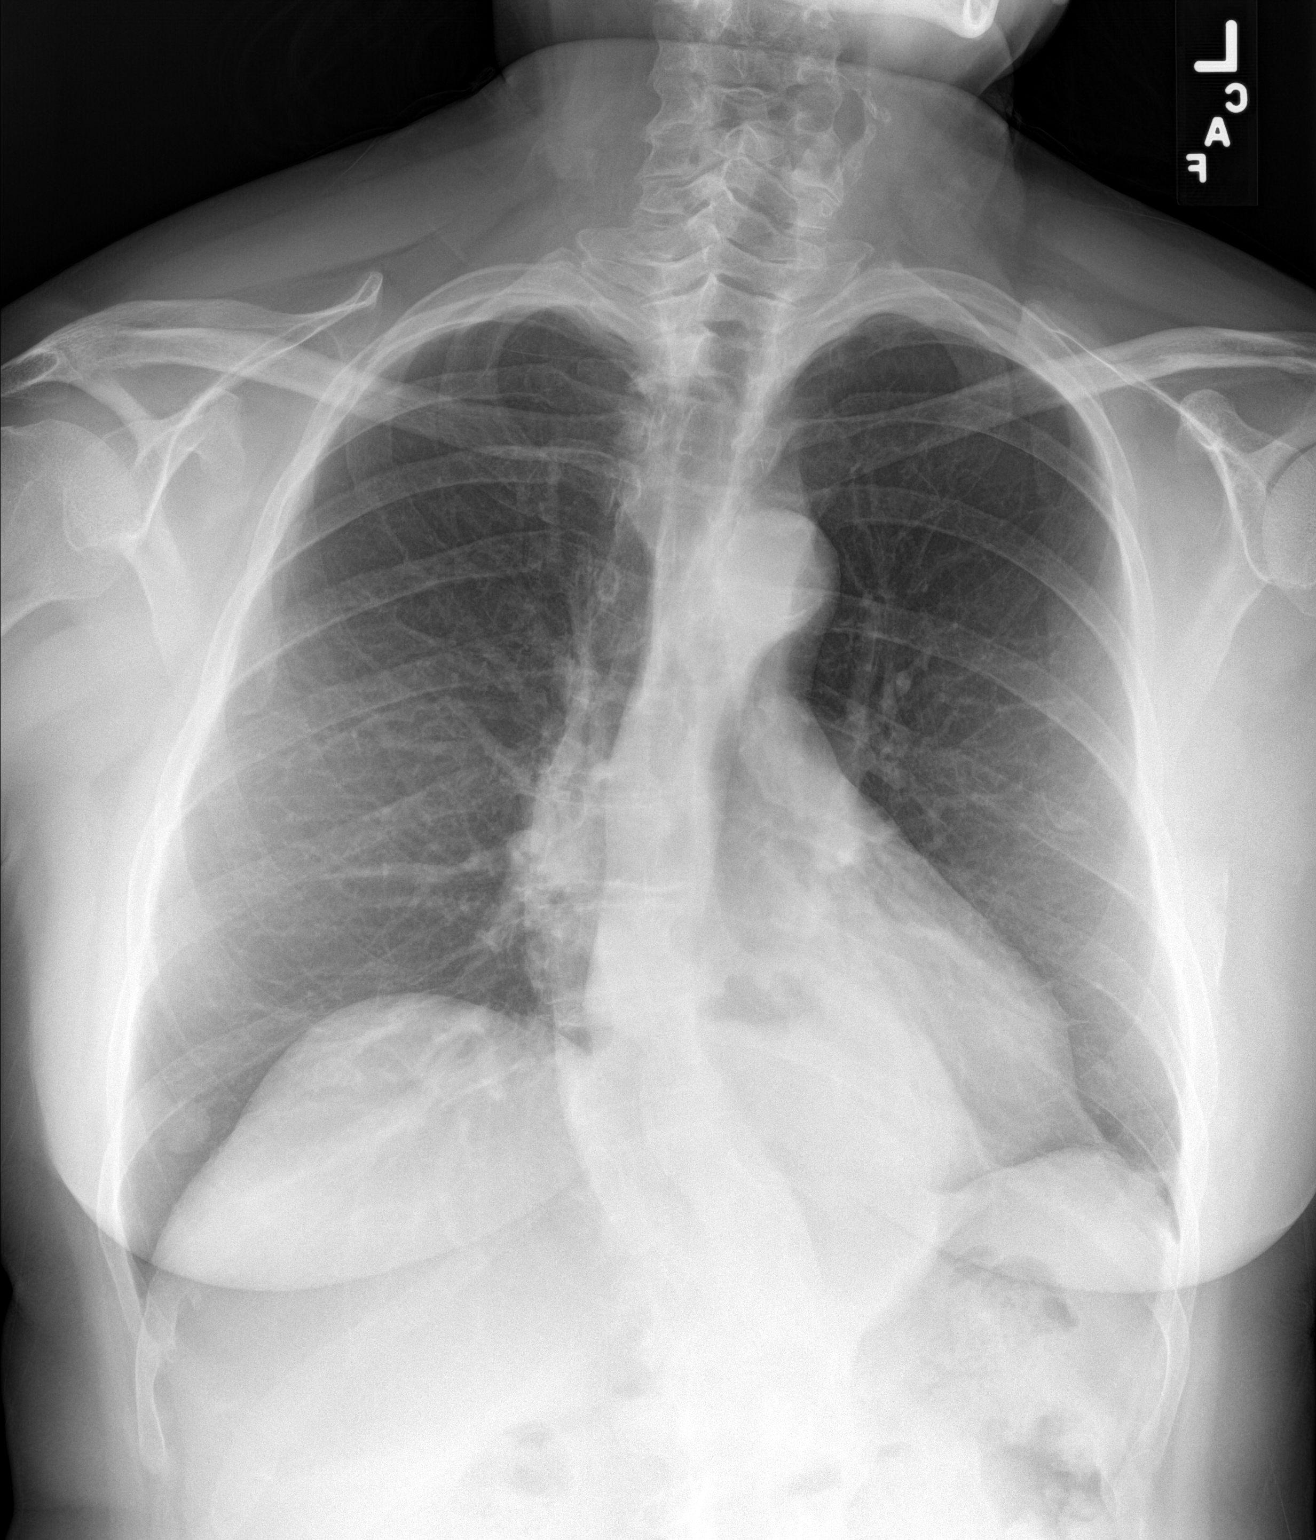

[chest lat]
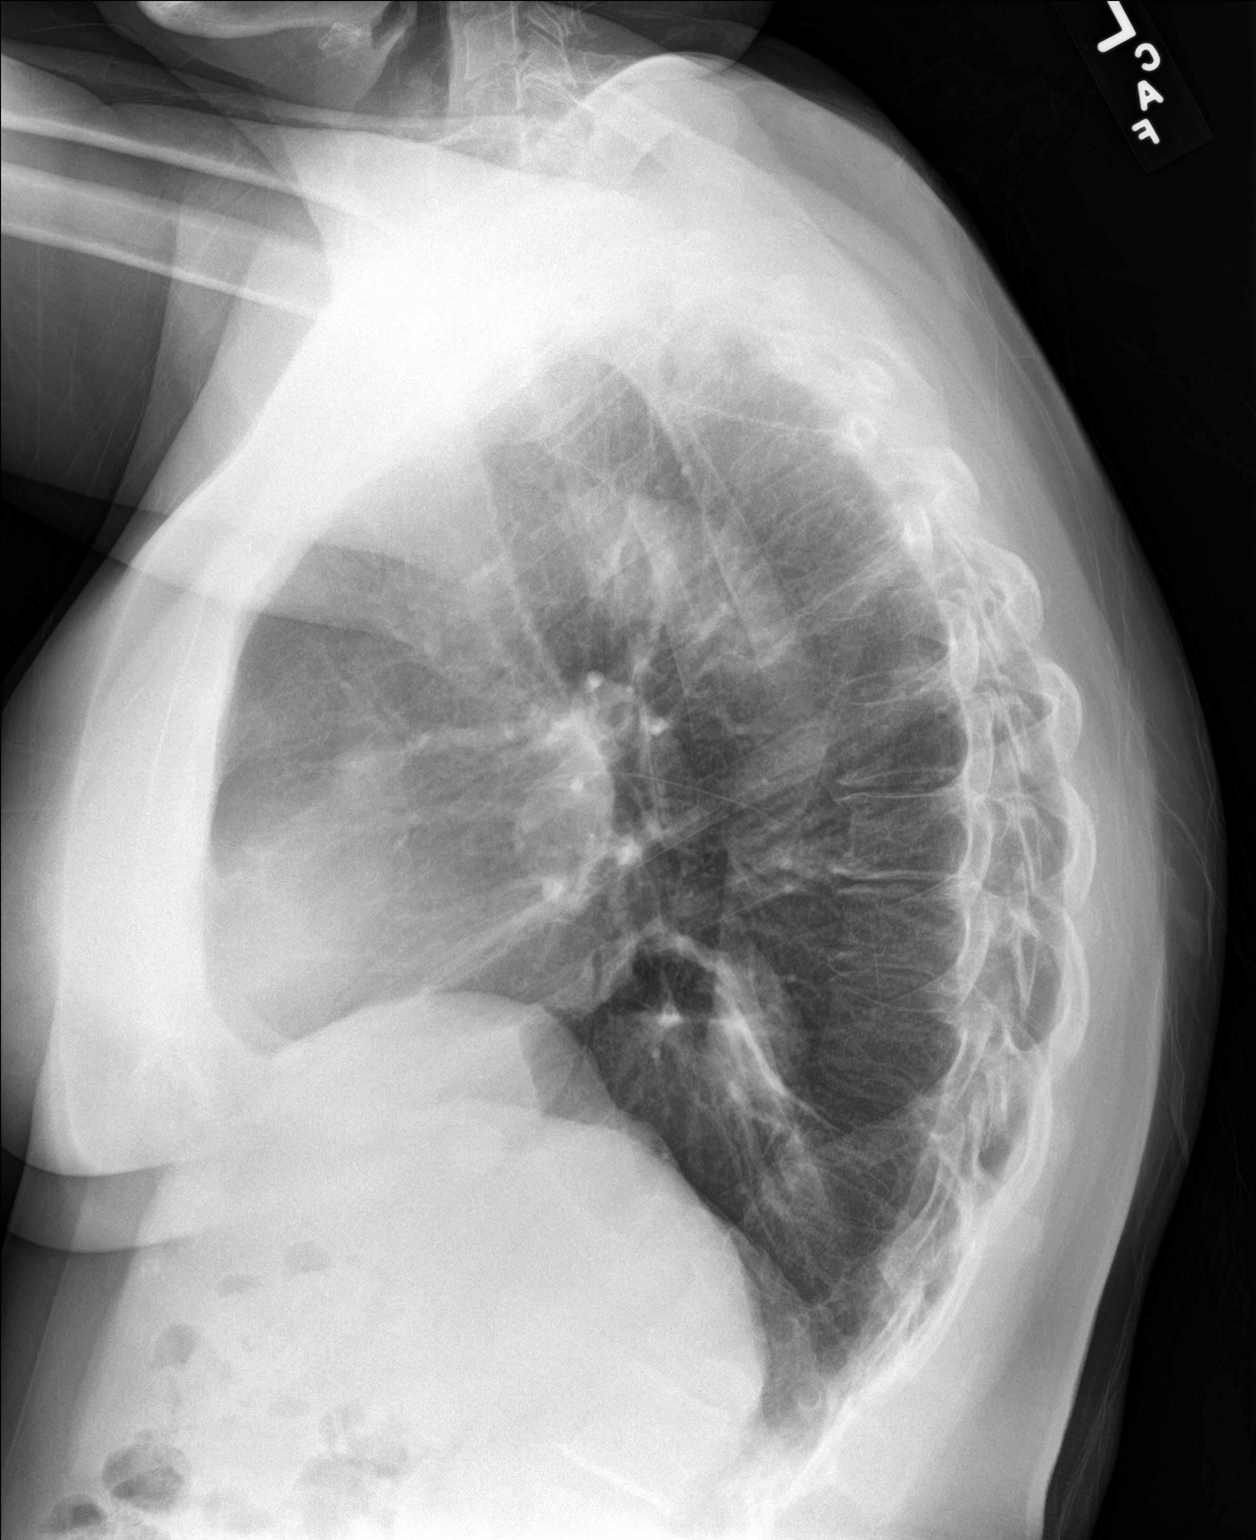

[2 of 2 positions shown; findings below may reference images not displayed]

FINDINGS: The cardiac silhouette is within normal limits for size. Aortic
atherosclerosis is noted. There is a moderate-sized hiatal hernia.
The lungs are normally to mildly hyperinflated. There is mild
streaky opacity in the left lower lobe most compatible with
atelectasis near the hernia. There is no evidence of edema, pleural
effusion, or pneumothorax. Nipple shadows are noted in the lung
bases. Moderate S-shaped thoracolumbar scoliosis is present, convex
right in the thoracic spine.
IMPRESSION: 1. Moderate-sized hiatal hernia with left lower lobe atelectasis.
2. Aortic atherosclerosis.
3. Thoracolumbar scoliosis.

## 2018-10-16 DIAGNOSIS — Z03818 Encounter for observation for suspected exposure to other biological agents ruled out: Secondary | ICD-10-CM | POA: Diagnosis not present

## 2018-10-23 DIAGNOSIS — Z03818 Encounter for observation for suspected exposure to other biological agents ruled out: Secondary | ICD-10-CM | POA: Diagnosis not present

## 2018-11-06 DIAGNOSIS — Z03818 Encounter for observation for suspected exposure to other biological agents ruled out: Secondary | ICD-10-CM | POA: Diagnosis not present

## 2018-11-12 DIAGNOSIS — Z03818 Encounter for observation for suspected exposure to other biological agents ruled out: Secondary | ICD-10-CM | POA: Diagnosis not present

## 2018-11-14 DIAGNOSIS — Z03818 Encounter for observation for suspected exposure to other biological agents ruled out: Secondary | ICD-10-CM | POA: Diagnosis not present

## 2018-11-18 DIAGNOSIS — Z03818 Encounter for observation for suspected exposure to other biological agents ruled out: Secondary | ICD-10-CM | POA: Diagnosis not present

## 2018-11-25 DIAGNOSIS — Z03818 Encounter for observation for suspected exposure to other biological agents ruled out: Secondary | ICD-10-CM | POA: Diagnosis not present

## 2018-12-02 DIAGNOSIS — Z03818 Encounter for observation for suspected exposure to other biological agents ruled out: Secondary | ICD-10-CM | POA: Diagnosis not present

## 2018-12-09 DIAGNOSIS — Z03818 Encounter for observation for suspected exposure to other biological agents ruled out: Secondary | ICD-10-CM | POA: Diagnosis not present

## 2018-12-16 ENCOUNTER — Telehealth: Payer: Self-pay | Admitting: Cardiology

## 2018-12-16 DIAGNOSIS — Z03818 Encounter for observation for suspected exposure to other biological agents ruled out: Secondary | ICD-10-CM | POA: Diagnosis not present

## 2018-12-16 NOTE — Telephone Encounter (Signed)
We can try to schedule her on my days to do virtual visits.  Glenetta Hew, MD

## 2018-12-16 NOTE — Telephone Encounter (Signed)
I spoke with pt's husband. He had been contacted by our office to schedule follow up and at that time reported information about pt not being able to come for office visit.  He reports pt is being followed by geriatric doctor at facility. He reports she has a DNR order. Husband reports pt has no cardiac complaints at current time. He would like to hold off on scheduling appointment at this time.  He is aware telemedicine visit can be scheduled if he would like

## 2018-12-16 NOTE — Telephone Encounter (Signed)
Patient husband is calling stating there's no way the patient can come in to see Dr. Ellyn Hack at this time or get labs done due to her being in a nursing home. The facility is on complete lock down due to Covid & he is wanting to know if a virtual appointment can be scheduled for him to speak with Dr. Ellyn Hack personally and catch him up to speed with her health. Please Advise.

## 2018-12-18 NOTE — Telephone Encounter (Signed)
That is perfectly fine.  Glenetta Hew, MD

## 2018-12-23 DIAGNOSIS — Z03818 Encounter for observation for suspected exposure to other biological agents ruled out: Secondary | ICD-10-CM | POA: Diagnosis not present

## 2018-12-25 DIAGNOSIS — Z03818 Encounter for observation for suspected exposure to other biological agents ruled out: Secondary | ICD-10-CM | POA: Diagnosis not present

## 2018-12-31 DIAGNOSIS — Z03818 Encounter for observation for suspected exposure to other biological agents ruled out: Secondary | ICD-10-CM | POA: Diagnosis not present

## 2019-01-06 DIAGNOSIS — Z03818 Encounter for observation for suspected exposure to other biological agents ruled out: Secondary | ICD-10-CM | POA: Diagnosis not present

## 2019-01-13 DIAGNOSIS — Z03818 Encounter for observation for suspected exposure to other biological agents ruled out: Secondary | ICD-10-CM | POA: Diagnosis not present

## 2019-01-20 DIAGNOSIS — Z03818 Encounter for observation for suspected exposure to other biological agents ruled out: Secondary | ICD-10-CM | POA: Diagnosis not present

## 2019-01-27 DIAGNOSIS — Z03818 Encounter for observation for suspected exposure to other biological agents ruled out: Secondary | ICD-10-CM | POA: Diagnosis not present

## 2019-02-03 DIAGNOSIS — Z03818 Encounter for observation for suspected exposure to other biological agents ruled out: Secondary | ICD-10-CM | POA: Diagnosis not present

## 2019-02-10 DIAGNOSIS — Z03818 Encounter for observation for suspected exposure to other biological agents ruled out: Secondary | ICD-10-CM | POA: Diagnosis not present

## 2019-02-17 DIAGNOSIS — Z03818 Encounter for observation for suspected exposure to other biological agents ruled out: Secondary | ICD-10-CM | POA: Diagnosis not present

## 2019-02-24 DIAGNOSIS — Z03818 Encounter for observation for suspected exposure to other biological agents ruled out: Secondary | ICD-10-CM | POA: Diagnosis not present

## 2019-03-03 DIAGNOSIS — Z03818 Encounter for observation for suspected exposure to other biological agents ruled out: Secondary | ICD-10-CM | POA: Diagnosis not present

## 2019-03-24 DIAGNOSIS — Z03818 Encounter for observation for suspected exposure to other biological agents ruled out: Secondary | ICD-10-CM | POA: Diagnosis not present

## 2019-04-09 DIAGNOSIS — Z1159 Encounter for screening for other viral diseases: Secondary | ICD-10-CM | POA: Diagnosis not present

## 2019-04-16 DIAGNOSIS — Z1159 Encounter for screening for other viral diseases: Secondary | ICD-10-CM | POA: Diagnosis not present

## 2019-04-23 DIAGNOSIS — Z1159 Encounter for screening for other viral diseases: Secondary | ICD-10-CM | POA: Diagnosis not present

## 2019-04-30 DIAGNOSIS — Z1159 Encounter for screening for other viral diseases: Secondary | ICD-10-CM | POA: Diagnosis not present

## 2019-05-07 DIAGNOSIS — Z1159 Encounter for screening for other viral diseases: Secondary | ICD-10-CM | POA: Diagnosis not present

## 2019-05-14 DIAGNOSIS — Z1159 Encounter for screening for other viral diseases: Secondary | ICD-10-CM | POA: Diagnosis not present

## 2019-05-21 DIAGNOSIS — Z1159 Encounter for screening for other viral diseases: Secondary | ICD-10-CM | POA: Diagnosis not present

## 2019-05-28 DIAGNOSIS — Z1159 Encounter for screening for other viral diseases: Secondary | ICD-10-CM | POA: Diagnosis not present

## 2019-06-04 DIAGNOSIS — Z1159 Encounter for screening for other viral diseases: Secondary | ICD-10-CM | POA: Diagnosis not present

## 2019-06-11 DIAGNOSIS — Z1159 Encounter for screening for other viral diseases: Secondary | ICD-10-CM | POA: Diagnosis not present

## 2019-06-18 DIAGNOSIS — Z1159 Encounter for screening for other viral diseases: Secondary | ICD-10-CM | POA: Diagnosis not present

## 2019-06-25 DIAGNOSIS — Z1159 Encounter for screening for other viral diseases: Secondary | ICD-10-CM | POA: Diagnosis not present

## 2019-07-02 DIAGNOSIS — Z1159 Encounter for screening for other viral diseases: Secondary | ICD-10-CM | POA: Diagnosis not present

## 2019-07-08 ENCOUNTER — Observation Stay (HOSPITAL_COMMUNITY): Payer: Medicare Other

## 2019-07-08 ENCOUNTER — Other Ambulatory Visit: Payer: Self-pay

## 2019-07-08 ENCOUNTER — Inpatient Hospital Stay (HOSPITAL_COMMUNITY)
Admission: EM | Admit: 2019-07-08 | Discharge: 2019-08-28 | DRG: 057 | Disposition: A | Discharge status: Skilled Nursing Facility | Payer: Medicare Other | Source: Skilled Nursing Facility | Attending: Internal Medicine | Admitting: Internal Medicine

## 2019-07-08 ENCOUNTER — Emergency Department (HOSPITAL_COMMUNITY): Payer: Medicare Other

## 2019-07-08 DIAGNOSIS — G309 Alzheimer's disease, unspecified: Secondary | ICD-10-CM | POA: Diagnosis not present

## 2019-07-08 DIAGNOSIS — Z6824 Body mass index (BMI) 24.0-24.9, adult: Secondary | ICD-10-CM | POA: Diagnosis not present

## 2019-07-08 DIAGNOSIS — E876 Hypokalemia: Secondary | ICD-10-CM | POA: Diagnosis not present

## 2019-07-08 DIAGNOSIS — I442 Atrioventricular block, complete: Secondary | ICD-10-CM

## 2019-07-08 DIAGNOSIS — I10 Essential (primary) hypertension: Secondary | ICD-10-CM | POA: Diagnosis present

## 2019-07-08 DIAGNOSIS — E440 Moderate protein-calorie malnutrition: Secondary | ICD-10-CM | POA: Diagnosis not present

## 2019-07-08 DIAGNOSIS — R451 Restlessness and agitation: Secondary | ICD-10-CM

## 2019-07-08 DIAGNOSIS — G40909 Epilepsy, unspecified, not intractable, without status epilepticus: Secondary | ICD-10-CM | POA: Diagnosis present

## 2019-07-08 DIAGNOSIS — I2510 Atherosclerotic heart disease of native coronary artery without angina pectoris: Secondary | ICD-10-CM | POA: Diagnosis present

## 2019-07-08 DIAGNOSIS — J9601 Acute respiratory failure with hypoxia: Secondary | ICD-10-CM | POA: Diagnosis not present

## 2019-07-08 DIAGNOSIS — E869 Volume depletion, unspecified: Secondary | ICD-10-CM | POA: Diagnosis not present

## 2019-07-08 DIAGNOSIS — Z87891 Personal history of nicotine dependence: Secondary | ICD-10-CM

## 2019-07-08 DIAGNOSIS — Z9981 Dependence on supplemental oxygen: Secondary | ICD-10-CM | POA: Diagnosis not present

## 2019-07-08 DIAGNOSIS — G300 Alzheimer's disease with early onset: Principal | ICD-10-CM | POA: Diagnosis present

## 2019-07-08 DIAGNOSIS — M419 Scoliosis, unspecified: Secondary | ICD-10-CM | POA: Diagnosis present

## 2019-07-08 DIAGNOSIS — F0281 Dementia in other diseases classified elsewhere with behavioral disturbance: Secondary | ICD-10-CM | POA: Diagnosis present

## 2019-07-08 DIAGNOSIS — G2119 Other drug induced secondary parkinsonism: Secondary | ICD-10-CM

## 2019-07-08 DIAGNOSIS — Z833 Family history of diabetes mellitus: Secondary | ICD-10-CM

## 2019-07-08 DIAGNOSIS — F0391 Unspecified dementia with behavioral disturbance: Secondary | ICD-10-CM | POA: Diagnosis not present

## 2019-07-08 DIAGNOSIS — F0280 Dementia in other diseases classified elsewhere without behavioral disturbance: Secondary | ICD-10-CM

## 2019-07-08 DIAGNOSIS — F909 Attention-deficit hyperactivity disorder, unspecified type: Secondary | ICD-10-CM | POA: Diagnosis present

## 2019-07-08 DIAGNOSIS — M810 Age-related osteoporosis without current pathological fracture: Secondary | ICD-10-CM | POA: Diagnosis present

## 2019-07-08 DIAGNOSIS — Z8249 Family history of ischemic heart disease and other diseases of the circulatory system: Secondary | ICD-10-CM

## 2019-07-08 DIAGNOSIS — E871 Hypo-osmolality and hyponatremia: Secondary | ICD-10-CM | POA: Diagnosis not present

## 2019-07-08 DIAGNOSIS — Z888 Allergy status to other drugs, medicaments and biological substances status: Secondary | ICD-10-CM

## 2019-07-08 DIAGNOSIS — Z66 Do not resuscitate: Secondary | ICD-10-CM | POA: Diagnosis not present

## 2019-07-08 DIAGNOSIS — Z9183 Wandering in diseases classified elsewhere: Secondary | ICD-10-CM | POA: Diagnosis not present

## 2019-07-08 DIAGNOSIS — Z79899 Other long term (current) drug therapy: Secondary | ICD-10-CM

## 2019-07-08 DIAGNOSIS — R4182 Altered mental status, unspecified: Secondary | ICD-10-CM

## 2019-07-08 DIAGNOSIS — F03911 Unspecified dementia, unspecified severity, with agitation: Secondary | ICD-10-CM | POA: Diagnosis present

## 2019-07-08 DIAGNOSIS — Z20822 Contact with and (suspected) exposure to covid-19: Secondary | ICD-10-CM | POA: Diagnosis present

## 2019-07-08 DIAGNOSIS — Z743 Need for continuous supervision: Secondary | ICD-10-CM | POA: Diagnosis not present

## 2019-07-08 DIAGNOSIS — R569 Unspecified convulsions: Secondary | ICD-10-CM | POA: Diagnosis not present

## 2019-07-08 DIAGNOSIS — R0600 Dyspnea, unspecified: Secondary | ICD-10-CM

## 2019-07-08 DIAGNOSIS — E559 Vitamin D deficiency, unspecified: Secondary | ICD-10-CM | POA: Diagnosis not present

## 2019-07-08 DIAGNOSIS — I25119 Atherosclerotic heart disease of native coronary artery with unspecified angina pectoris: Secondary | ICD-10-CM

## 2019-07-08 DIAGNOSIS — R627 Adult failure to thrive: Secondary | ICD-10-CM | POA: Diagnosis present

## 2019-07-08 DIAGNOSIS — N390 Urinary tract infection, site not specified: Secondary | ICD-10-CM | POA: Diagnosis not present

## 2019-07-08 DIAGNOSIS — Z881 Allergy status to other antibiotic agents status: Secondary | ICD-10-CM

## 2019-07-08 DIAGNOSIS — Z820 Family history of epilepsy and other diseases of the nervous system: Secondary | ICD-10-CM

## 2019-07-08 DIAGNOSIS — K449 Diaphragmatic hernia without obstruction or gangrene: Secondary | ICD-10-CM | POA: Diagnosis not present

## 2019-07-08 DIAGNOSIS — R404 Transient alteration of awareness: Secondary | ICD-10-CM | POA: Diagnosis not present

## 2019-07-08 DIAGNOSIS — Z7982 Long term (current) use of aspirin: Secondary | ICD-10-CM

## 2019-07-08 DIAGNOSIS — E785 Hyperlipidemia, unspecified: Secondary | ICD-10-CM | POA: Diagnosis present

## 2019-07-08 DIAGNOSIS — Z91018 Allergy to other foods: Secondary | ICD-10-CM

## 2019-07-08 DIAGNOSIS — E870 Hyperosmolality and hypernatremia: Secondary | ICD-10-CM | POA: Diagnosis not present

## 2019-07-08 DIAGNOSIS — F418 Other specified anxiety disorders: Secondary | ICD-10-CM | POA: Diagnosis present

## 2019-07-08 DIAGNOSIS — R9431 Abnormal electrocardiogram [ECG] [EKG]: Secondary | ICD-10-CM | POA: Diagnosis not present

## 2019-07-08 LAB — CBC WITH DIFFERENTIAL/PLATELET
Abs Immature Granulocytes: 0.02 10*3/uL (ref 0.00–0.07)
Basophils Absolute: 0 10*3/uL (ref 0.0–0.1)
Basophils Relative: 1 %
Eosinophils Absolute: 0.1 10*3/uL (ref 0.0–0.5)
Eosinophils Relative: 1 %
HCT: 35.5 % — ABNORMAL LOW (ref 36.0–46.0)
Hemoglobin: 11.5 g/dL — ABNORMAL LOW (ref 12.0–15.0)
Immature Granulocytes: 0 %
Lymphocytes Relative: 31 %
Lymphs Abs: 1.9 10*3/uL (ref 0.7–4.0)
MCH: 29.4 pg (ref 26.0–34.0)
MCHC: 32.4 g/dL (ref 30.0–36.0)
MCV: 90.8 fL (ref 80.0–100.0)
Monocytes Absolute: 0.7 10*3/uL (ref 0.1–1.0)
Monocytes Relative: 12 %
Neutro Abs: 3.4 10*3/uL (ref 1.7–7.7)
Neutrophils Relative %: 55 %
Platelets: 196 10*3/uL (ref 150–400)
RBC: 3.91 MIL/uL (ref 3.87–5.11)
RDW: 14.1 % (ref 11.5–15.5)
WBC: 6.2 10*3/uL (ref 4.0–10.5)
nRBC: 0 % (ref 0.0–0.2)

## 2019-07-08 LAB — COMPREHENSIVE METABOLIC PANEL
ALT: 27 U/L (ref 0–44)
AST: 24 U/L (ref 15–41)
Albumin: 3.9 g/dL (ref 3.5–5.0)
Alkaline Phosphatase: 80 U/L (ref 38–126)
Anion gap: 10 (ref 5–15)
BUN: 22 mg/dL (ref 8–23)
CO2: 24 mmol/L (ref 22–32)
Calcium: 9.4 mg/dL (ref 8.9–10.3)
Chloride: 108 mmol/L (ref 98–111)
Creatinine, Ser: 0.71 mg/dL (ref 0.44–1.00)
GFR calc Af Amer: >60 mL/min (ref >60)
GFR calc non Af Amer: >60 mL/min (ref >60)
Glucose, Bld: 100 mg/dL — ABNORMAL HIGH (ref 70–99)
Potassium: 4.2 mmol/L (ref 3.5–5.1)
Sodium: 142 mmol/L (ref 135–145)
Total Bilirubin: 0.5 mg/dL (ref 0.3–1.2)
Total Protein: 7 g/dL (ref 6.5–8.1)

## 2019-07-08 LAB — TSH: TSH: 0.645 u[IU]/mL (ref 0.350–4.500)

## 2019-07-08 LAB — URINALYSIS, ROUTINE W REFLEX MICROSCOPIC
Bilirubin Urine: NEGATIVE
Glucose, UA: NEGATIVE mg/dL
Ketones, ur: 20 mg/dL — AB
Nitrite: NEGATIVE
Protein, ur: 100 mg/dL — AB
RBC / HPF: >50 RBC/hpf — ABNORMAL HIGH (ref 0–5)
Specific Gravity, Urine: 1.039 — ABNORMAL HIGH (ref 1.005–1.030)
pH: 7 (ref 5.0–8.0)

## 2019-07-08 LAB — LIPASE, BLOOD: Lipase: 32 U/L (ref 11–51)

## 2019-07-08 LAB — AMMONIA: Ammonia: 20 umol/L (ref 9–35)

## 2019-07-08 MED ORDER — ACETAMINOPHEN 500 MG PO TABS
500.0000 mg | ORAL_TABLET | Freq: Three times a day (TID) | ORAL | Status: DC
  Administered 2019-07-08 – 2019-07-18 (×29): 500 mg via ORAL
  Filled 2019-07-08 (×29): qty 1

## 2019-07-08 MED ORDER — VITAMIN D 25 MCG (1000 UNIT) PO TABS
5000.0000 [IU] | ORAL_TABLET | Freq: Every day | ORAL | Status: DC
  Administered 2019-07-09 – 2019-08-27 (×48): 5000 [IU] via ORAL
  Filled 2019-07-08 (×53): qty 5

## 2019-07-08 MED ORDER — SODIUM CHLORIDE 0.9 % IV SOLN: INTRAVENOUS | Status: DC

## 2019-07-08 MED ORDER — CLONAZEPAM 0.125 MG PO TBDP
0.2500 mg | ORAL_TABLET | Freq: Every day | ORAL | Status: DC
  Administered 2019-07-08 – 2019-07-16 (×9): 0.25 mg via ORAL
  Filled 2019-07-08 (×10): qty 2

## 2019-07-08 MED ORDER — ACETAMINOPHEN 650 MG RE SUPP: 650.0000 mg | Freq: Four times a day (QID) | RECTAL | Status: DC | PRN

## 2019-07-08 MED ORDER — ENOXAPARIN SODIUM 40 MG/0.4ML ~~LOC~~ SOLN
40.0000 mg | SUBCUTANEOUS | Status: DC
  Administered 2019-07-08 – 2019-08-27 (×47): 40 mg via SUBCUTANEOUS
  Filled 2019-07-08 (×47): qty 0.4

## 2019-07-08 MED ORDER — LEVETIRACETAM 500 MG PO TABS
1500.0000 mg | ORAL_TABLET | Freq: Two times a day (BID) | ORAL | Status: DC
  Administered 2019-07-08 – 2019-07-09 (×2): 1500 mg via ORAL
  Filled 2019-07-08 (×2): qty 3

## 2019-07-08 MED ORDER — QUETIAPINE FUMARATE 50 MG PO TABS
50.0000 mg | ORAL_TABLET | Freq: Three times a day (TID) | ORAL | Status: DC
  Administered 2019-07-08 – 2019-07-20 (×35): 50 mg via ORAL
  Filled 2019-07-08 (×3): qty 2
  Filled 2019-07-08 (×4): qty 1
  Filled 2019-07-08: qty 2
  Filled 2019-07-08 (×4): qty 1
  Filled 2019-07-08: qty 2
  Filled 2019-07-08: qty 1
  Filled 2019-07-08: qty 2
  Filled 2019-07-08 (×2): qty 1
  Filled 2019-07-08 (×2): qty 2
  Filled 2019-07-08 (×2): qty 1
  Filled 2019-07-08: qty 2
  Filled 2019-07-08 (×6): qty 1
  Filled 2019-07-08 (×3): qty 2
  Filled 2019-07-08: qty 1
  Filled 2019-07-08 (×5): qty 2
  Filled 2019-07-08 (×2): qty 1
  Filled 2019-07-08 (×2): qty 2
  Filled 2019-07-08: qty 1
  Filled 2019-07-08: qty 2
  Filled 2019-07-08: qty 1
  Filled 2019-07-08 (×3): qty 2
  Filled 2019-07-08 (×4): qty 1
  Filled 2019-07-08 (×2): qty 2
  Filled 2019-07-08 (×4): qty 1
  Filled 2019-07-08 (×3): qty 2
  Filled 2019-07-08: qty 1
  Filled 2019-07-08 (×2): qty 2
  Filled 2019-07-08 (×2): qty 1
  Filled 2019-07-08 (×2): qty 2
  Filled 2019-07-08: qty 1

## 2019-07-08 MED ORDER — ACETAMINOPHEN 325 MG PO TABS
650.0000 mg | ORAL_TABLET | Freq: Four times a day (QID) | ORAL | Status: DC | PRN
  Administered 2019-07-25 – 2019-08-17 (×7): 650 mg via ORAL
  Filled 2019-07-08 (×8): qty 2

## 2019-07-08 MED ORDER — ONDANSETRON HCL 4 MG PO TABS: 4.0000 mg | ORAL_TABLET | Freq: Four times a day (QID) | ORAL | Status: DC | PRN

## 2019-07-08 MED ORDER — HALOPERIDOL LACTATE 5 MG/ML IJ SOLN
1.0000 mg | Freq: Four times a day (QID) | INTRAMUSCULAR | Status: DC | PRN
  Administered 2019-07-08 – 2019-07-11 (×6): 2 mg via INTRAVENOUS
  Filled 2019-07-08 (×9): qty 1

## 2019-07-08 MED ORDER — QUETIAPINE FUMARATE 25 MG PO TABS
12.5000 mg | ORAL_TABLET | ORAL | Status: DC | PRN
  Administered 2019-07-18 – 2019-07-27 (×2): 12.5 mg via ORAL
  Filled 2019-07-08 (×3): qty 1

## 2019-07-08 MED ORDER — SODIUM CHLORIDE 0.9 % IV SOLN
1.0000 g | Freq: Once | INTRAVENOUS | Status: AC
  Administered 2019-07-08: 1 g via INTRAVENOUS
  Filled 2019-07-08: qty 10

## 2019-07-08 MED ORDER — MELATONIN 5 MG PO TABS
10.0000 mg | ORAL_TABLET | Freq: Every day | ORAL | Status: DC
  Administered 2019-07-08 – 2019-08-03 (×27): 10 mg via ORAL
  Filled 2019-07-08 (×33): qty 2

## 2019-07-08 MED ORDER — CARVEDILOL 3.125 MG PO TABS
3.1250 mg | ORAL_TABLET | Freq: Two times a day (BID) | ORAL | Status: DC
  Administered 2019-07-09 – 2019-08-27 (×67): 3.125 mg via ORAL
  Filled 2019-07-08 (×81): qty 1

## 2019-07-08 MED ORDER — IBUPROFEN 200 MG PO TABS
200.0000 mg | ORAL_TABLET | Freq: Four times a day (QID) | ORAL | Status: DC | PRN
  Administered 2019-07-21 – 2019-08-16 (×5): 200 mg via ORAL
  Filled 2019-07-08 (×5): qty 1

## 2019-07-08 MED ORDER — DIVALPROEX SODIUM 125 MG PO CSDR
125.0000 mg | DELAYED_RELEASE_CAPSULE | Freq: Three times a day (TID) | ORAL | Status: DC
  Administered 2019-07-09 – 2019-07-17 (×26): 125 mg via ORAL
  Filled 2019-07-08 (×29): qty 1

## 2019-07-08 MED ORDER — CLONAZEPAM 0.5 MG PO TABS: 0.2500 mg | ORAL_TABLET | Freq: Every day | ORAL | Status: DC

## 2019-07-08 MED ORDER — ADULT MULTIVITAMIN W/MINERALS CH
1.0000 | ORAL_TABLET | Freq: Every day | ORAL | Status: DC
  Administered 2019-07-09 – 2019-08-27 (×50): 1 via ORAL
  Filled 2019-07-08 (×50): qty 1

## 2019-07-08 MED ORDER — ONDANSETRON HCL 4 MG/2ML IJ SOLN: 4.0000 mg | Freq: Four times a day (QID) | INTRAMUSCULAR | Status: DC | PRN

## 2019-07-08 MED ORDER — ASPIRIN 81 MG PO CHEW
81.0000 mg | CHEWABLE_TABLET | Freq: Every day | ORAL | Status: DC
  Administered 2019-07-09 – 2019-08-27 (×50): 81 mg via ORAL
  Filled 2019-07-08 (×51): qty 1

## 2019-07-08 NOTE — ED Notes (Signed)
Called report to nurse [place] for pt transfer to the floor
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
## 2019-07-09 DIAGNOSIS — R451 Restlessness and agitation: Secondary | ICD-10-CM

## 2019-07-09 LAB — BASIC METABOLIC PANEL
Anion gap: 9 (ref 5–15)
BUN: 14 mg/dL (ref 8–23)
CO2: 23 mmol/L (ref 22–32)
Calcium: 8.9 mg/dL (ref 8.9–10.3)
Chloride: 109 mmol/L (ref 98–111)
Creatinine, Ser: 0.57 mg/dL (ref 0.44–1.00)
GFR calc Af Amer: >60 mL/min (ref >60)
GFR calc non Af Amer: >60 mL/min (ref >60)
Glucose, Bld: 95 mg/dL (ref 70–99)
Potassium: 3.6 mmol/L (ref 3.5–5.1)
Sodium: 141 mmol/L (ref 135–145)

## 2019-07-09 LAB — CBC
HCT: 35.4 % — ABNORMAL LOW (ref 36.0–46.0)
Hemoglobin: 11.4 g/dL — ABNORMAL LOW (ref 12.0–15.0)
MCH: 29.7 pg (ref 26.0–34.0)
MCHC: 32.2 g/dL (ref 30.0–36.0)
MCV: 92.2 fL (ref 80.0–100.0)
Platelets: 223 10*3/uL (ref 150–400)
RBC: 3.84 MIL/uL — ABNORMAL LOW (ref 3.87–5.11)
RDW: 13.9 % (ref 11.5–15.5)
WBC: 4.9 10*3/uL (ref 4.0–10.5)
nRBC: 0 % (ref 0.0–0.2)

## 2019-07-09 LAB — URINE CULTURE: Culture: NO GROWTH

## 2019-07-09 LAB — HIV ANTIBODY (ROUTINE TESTING W REFLEX): HIV Screen 4th Generation wRfx: NONREACTIVE

## 2019-07-09 LAB — SARS CORONAVIRUS 2 BY RT PCR (HOSPITAL ORDER, PERFORMED IN ~~LOC~~ HOSPITAL LAB): SARS Coronavirus 2: NEGATIVE

## 2019-07-09 MED ORDER — LEVETIRACETAM 100 MG/ML PO SOLN
1500.0000 mg | Freq: Two times a day (BID) | ORAL | Status: DC
  Administered 2019-07-09 – 2019-08-27 (×98): 1500 mg via ORAL
  Filled 2019-07-09 (×101): qty 15

## 2019-07-09 MED ORDER — SODIUM CHLORIDE 0.9 % IV SOLN
1.0000 g | INTRAVENOUS | Status: AC
  Administered 2019-07-09 – 2019-07-11 (×3): 1 g via INTRAVENOUS
  Filled 2019-07-09 (×3): qty 1

## 2019-07-09 NOTE — Progress Notes (Signed)
During shift change, patient resting comfortably. Posey belt removed. RN will continue to monitor the patient.
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
## 2019-07-09 NOTE — Progress Notes (Signed)
Pt resting will continue to monitor.  

## 2019-07-09 NOTE — Progress Notes (Signed)
Pt continues to rest comfortably will continue to monitor.

## 2019-07-09 NOTE — Progress Notes (Signed)
Pt arrived to unit via ED stretcher. Pt has a posey belt on and repeatedly tries to get to the edge of the bed and out. RN redirected Pt with help from husband. Pt appears to be extremely agitated. No medications to give at this time. Bed is in lowest position and wheels are locked. Will continue to monitor.

## 2019-07-09 NOTE — Progress Notes (Signed)
Pt is resting will continue to monitor.  

## 2019-07-09 NOTE — Progress Notes (Signed)
This writer went to give scheduled IV antibiotics and noticed this patient was climbing out of bed, yelling/pushing  husband at bedside, pulling at LFA IV site. Patient is difficult to redirect with unsafe behaviors. This Probation officer gave PRN Haldol 2mg  via IV at 1808 with fair effect. Patient continued to climb out of bed. Had to stop, IV antibiotics due to patient pulling at IV site. Dr. Dwyane Dee was paged via [facility] for a sitter order. Patient is currently in bed with husband at bedside. Sitter in room.

## 2019-07-10 DIAGNOSIS — Z20822 Contact with and (suspected) exposure to covid-19: Secondary | ICD-10-CM | POA: Diagnosis present

## 2019-07-10 DIAGNOSIS — F0391 Unspecified dementia with behavioral disturbance: Secondary | ICD-10-CM | POA: Diagnosis not present

## 2019-07-10 DIAGNOSIS — R627 Adult failure to thrive: Secondary | ICD-10-CM | POA: Diagnosis present

## 2019-07-10 DIAGNOSIS — E870 Hyperosmolality and hypernatremia: Secondary | ICD-10-CM | POA: Diagnosis not present

## 2019-07-10 DIAGNOSIS — R451 Restlessness and agitation: Secondary | ICD-10-CM | POA: Diagnosis not present

## 2019-07-10 DIAGNOSIS — R569 Unspecified convulsions: Secondary | ICD-10-CM | POA: Diagnosis not present

## 2019-07-10 DIAGNOSIS — E869 Volume depletion, unspecified: Secondary | ICD-10-CM | POA: Diagnosis not present

## 2019-07-10 DIAGNOSIS — M810 Age-related osteoporosis without current pathological fracture: Secondary | ICD-10-CM | POA: Diagnosis present

## 2019-07-10 DIAGNOSIS — F909 Attention-deficit hyperactivity disorder, unspecified type: Secondary | ICD-10-CM | POA: Diagnosis present

## 2019-07-10 DIAGNOSIS — E440 Moderate protein-calorie malnutrition: Secondary | ICD-10-CM | POA: Diagnosis not present

## 2019-07-10 DIAGNOSIS — J9811 Atelectasis: Secondary | ICD-10-CM | POA: Diagnosis not present

## 2019-07-10 DIAGNOSIS — Z9183 Wandering in diseases classified elsewhere: Secondary | ICD-10-CM | POA: Diagnosis not present

## 2019-07-10 DIAGNOSIS — I10 Essential (primary) hypertension: Secondary | ICD-10-CM | POA: Diagnosis not present

## 2019-07-10 DIAGNOSIS — J9601 Acute respiratory failure with hypoxia: Secondary | ICD-10-CM | POA: Diagnosis not present

## 2019-07-10 DIAGNOSIS — E871 Hypo-osmolality and hyponatremia: Secondary | ICD-10-CM | POA: Diagnosis not present

## 2019-07-10 DIAGNOSIS — E785 Hyperlipidemia, unspecified: Secondary | ICD-10-CM | POA: Diagnosis present

## 2019-07-10 DIAGNOSIS — E559 Vitamin D deficiency, unspecified: Secondary | ICD-10-CM | POA: Diagnosis present

## 2019-07-10 DIAGNOSIS — R410 Disorientation, unspecified: Secondary | ICD-10-CM | POA: Diagnosis not present

## 2019-07-10 DIAGNOSIS — Z66 Do not resuscitate: Secondary | ICD-10-CM | POA: Diagnosis present

## 2019-07-10 DIAGNOSIS — I499 Cardiac arrhythmia, unspecified: Secondary | ICD-10-CM | POA: Diagnosis not present

## 2019-07-10 DIAGNOSIS — G309 Alzheimer's disease, unspecified: Secondary | ICD-10-CM | POA: Diagnosis not present

## 2019-07-10 DIAGNOSIS — Z820 Family history of epilepsy and other diseases of the nervous system: Secondary | ICD-10-CM | POA: Diagnosis not present

## 2019-07-10 DIAGNOSIS — F0281 Dementia in other diseases classified elsewhere with behavioral disturbance: Secondary | ICD-10-CM | POA: Diagnosis present

## 2019-07-10 DIAGNOSIS — Z9981 Dependence on supplemental oxygen: Secondary | ICD-10-CM | POA: Diagnosis not present

## 2019-07-10 DIAGNOSIS — E876 Hypokalemia: Secondary | ICD-10-CM | POA: Diagnosis not present

## 2019-07-10 DIAGNOSIS — Z743 Need for continuous supervision: Secondary | ICD-10-CM | POA: Diagnosis not present

## 2019-07-10 DIAGNOSIS — G300 Alzheimer's disease with early onset: Secondary | ICD-10-CM | POA: Diagnosis present

## 2019-07-10 DIAGNOSIS — G40909 Epilepsy, unspecified, not intractable, without status epilepticus: Secondary | ICD-10-CM | POA: Diagnosis present

## 2019-07-10 DIAGNOSIS — M2550 Pain in unspecified joint: Secondary | ICD-10-CM | POA: Diagnosis not present

## 2019-07-10 DIAGNOSIS — Z8249 Family history of ischemic heart disease and other diseases of the circulatory system: Secondary | ICD-10-CM | POA: Diagnosis not present

## 2019-07-10 DIAGNOSIS — R4182 Altered mental status, unspecified: Secondary | ICD-10-CM | POA: Diagnosis present

## 2019-07-10 DIAGNOSIS — M419 Scoliosis, unspecified: Secondary | ICD-10-CM | POA: Diagnosis present

## 2019-07-10 DIAGNOSIS — I2510 Atherosclerotic heart disease of native coronary artery without angina pectoris: Secondary | ICD-10-CM | POA: Diagnosis present

## 2019-07-10 DIAGNOSIS — Z7401 Bed confinement status: Secondary | ICD-10-CM | POA: Diagnosis not present

## 2019-07-10 DIAGNOSIS — K449 Diaphragmatic hernia without obstruction or gangrene: Secondary | ICD-10-CM | POA: Diagnosis not present

## 2019-07-10 DIAGNOSIS — F0280 Dementia in other diseases classified elsewhere without behavioral disturbance: Secondary | ICD-10-CM | POA: Diagnosis not present

## 2019-07-10 DIAGNOSIS — F418 Other specified anxiety disorders: Secondary | ICD-10-CM | POA: Diagnosis present

## 2019-07-10 DIAGNOSIS — R0602 Shortness of breath: Secondary | ICD-10-CM | POA: Diagnosis not present

## 2019-07-10 DIAGNOSIS — Z6824 Body mass index (BMI) 24.0-24.9, adult: Secondary | ICD-10-CM | POA: Diagnosis not present

## 2019-07-10 LAB — COMPREHENSIVE METABOLIC PANEL
ALT: 23 U/L (ref 0–44)
AST: 17 U/L (ref 15–41)
Albumin: 3.2 g/dL — ABNORMAL LOW (ref 3.5–5.0)
Alkaline Phosphatase: 69 U/L (ref 38–126)
Anion gap: 7 (ref 5–15)
BUN: 13 mg/dL (ref 8–23)
CO2: 23 mmol/L (ref 22–32)
Calcium: 8.8 mg/dL — ABNORMAL LOW (ref 8.9–10.3)
Chloride: 113 mmol/L — ABNORMAL HIGH (ref 98–111)
Creatinine, Ser: 0.66 mg/dL (ref 0.44–1.00)
GFR calc Af Amer: >60 mL/min (ref >60)
GFR calc non Af Amer: >60 mL/min (ref >60)
Glucose, Bld: 86 mg/dL (ref 70–99)
Potassium: 3.5 mmol/L (ref 3.5–5.1)
Sodium: 143 mmol/L (ref 135–145)
Total Bilirubin: 0.5 mg/dL (ref 0.3–1.2)
Total Protein: 5.9 g/dL — ABNORMAL LOW (ref 6.5–8.1)

## 2019-07-10 LAB — CBC
HCT: 33.7 % — ABNORMAL LOW (ref 36.0–46.0)
Hemoglobin: 10.7 g/dL — ABNORMAL LOW (ref 12.0–15.0)
MCH: 29.6 pg (ref 26.0–34.0)
MCHC: 31.8 g/dL (ref 30.0–36.0)
MCV: 93.1 fL (ref 80.0–100.0)
Platelets: 204 10*3/uL (ref 150–400)
RBC: 3.62 MIL/uL — ABNORMAL LOW (ref 3.87–5.11)
RDW: 14 % (ref 11.5–15.5)
WBC: 4.7 10*3/uL (ref 4.0–10.5)
nRBC: 0 % (ref 0.0–0.2)

## 2019-07-10 LAB — MAGNESIUM: Magnesium: 2 mg/dL (ref 1.7–2.4)

## 2019-07-10 LAB — PHOSPHORUS: Phosphorus: 3.9 mg/dL (ref 2.5–4.6)

## 2019-07-10 MED ORDER — HALOPERIDOL LACTATE 5 MG/ML IJ SOLN
1.0000 mg | Freq: Once | INTRAMUSCULAR | Status: AC
  Administered 2019-07-10: 1 mg via INTRAVENOUS
  Filled 2019-07-10: qty 1
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
## 2019-07-11 LAB — BASIC METABOLIC PANEL
Anion gap: 7 (ref 5–15)
BUN: 10 mg/dL (ref 8–23)
CO2: 25 mmol/L (ref 22–32)
Calcium: 8.6 mg/dL — ABNORMAL LOW (ref 8.9–10.3)
Chloride: 110 mmol/L (ref 98–111)
Creatinine, Ser: 0.68 mg/dL (ref 0.44–1.00)
GFR calc Af Amer: >60 mL/min (ref >60)
GFR calc non Af Amer: >60 mL/min (ref >60)
Glucose, Bld: 85 mg/dL (ref 70–99)
Potassium: 3.4 mmol/L — ABNORMAL LOW (ref 3.5–5.1)
Sodium: 142 mmol/L (ref 135–145)

## 2019-07-11 LAB — CBC
HCT: 33 % — ABNORMAL LOW (ref 36.0–46.0)
Hemoglobin: 10.4 g/dL — ABNORMAL LOW (ref 12.0–15.0)
MCH: 29.2 pg (ref 26.0–34.0)
MCHC: 31.5 g/dL (ref 30.0–36.0)
MCV: 92.7 fL (ref 80.0–100.0)
Platelets: 218 10*3/uL (ref 150–400)
RBC: 3.56 MIL/uL — ABNORMAL LOW (ref 3.87–5.11)
RDW: 13.8 % (ref 11.5–15.5)
WBC: 4.4 10*3/uL (ref 4.0–10.5)
nRBC: 0 % (ref 0.0–0.2)

## 2019-07-11 LAB — PHOSPHORUS: Phosphorus: 3.9 mg/dL (ref 2.5–4.6)

## 2019-07-11 LAB — VALPROIC ACID LEVEL: Valproic Acid Lvl: 21 ug/mL — ABNORMAL LOW (ref 50.0–100.0)

## 2019-07-11 LAB — MAGNESIUM: Magnesium: 2 mg/dL (ref 1.7–2.4)

## 2019-07-11 MED ORDER — POTASSIUM CHLORIDE 20 MEQ PO PACK
40.0000 meq | PACK | Freq: Once | ORAL | Status: AC
  Administered 2019-07-11: 40 meq via ORAL
  Filled 2019-07-11: qty 2

## 2019-07-11 MED ORDER — LEVETIRACETAM 100 MG/ML PO SOLN: 1500.0000 mg | Freq: Two times a day (BID) | ORAL | 12 refills | Status: DC

## 2019-07-11 NOTE — Progress Notes (Signed)
I noted that patient HR was 44 this morning at 0432. Patient had scheduled Coreg at 0800 and again at 1700. Rechecked pateint HR at 0824 and again at 1014. HR at 0824 69 and HR at 1014 69. After consulting w/ Mollie Germany, RN (charge nurse) I sent Dr. Dory Horn page for directions. He came and informed me that I could hold the Coreg and that patient HR may be inconsistent due to recent change in psych medications.

## 2019-07-11 NOTE — TOC Initial Note (Addendum)
Transition of Care [facility]) - Initial/Assessment Note    Patient Details  Name: Lisa Mclaughlin MRN: [id number] Date of Birth: June 14, 1950  Transition of Care [facility]) CM/SW Contact:    Trish Mage, LCSW Phone Number: 07/11/2019, 11:55 AM  Clinical Narrative:   Patient seen in follow-up to psychiatry recommendation of gero-psych bed.  Spoke initially to patient, who was pleasantly confused and displayed incongruent affect.  Spoke then to husband, who related the following to me.  He has done some research and would prefer she not be referred to [facility] at this time.  Rather, he gave me information to [facility] for [facility] in [place], [place], and prefers she goes there.  Information sent, with follow up phone call.  They are reviewing.   We talked about transportation, and he is hopeful that her [facility] insurance would pay for ambulance transport to facility.  I explained we would check, but if denied or no coverage, it would be an out-of-pocket expense.  I asked about  A Plan B.  There currently is not one.  If denied by [facility], he would like to know of options available in this region other than [place].  Will ask [facility] for recommendations if it comes to that.  TOC will continue to follow during the course of hospitalization.  AddendumLavella Hammock denied patient.  Referrals FAXed to [place] [facility], [facility], [place], [place]. Waiting to hear back about bed offers.              Expected Discharge Plan: Psychiatric Hospital Barriers to Discharge: Psych Bed not available   Patient Goals and CMS Choice Patient states their goals for this hospitalization and ongoing recovery are:: Unable to verbalize      Expected Discharge Plan and Services Expected Discharge Plan: [facility]       Living arrangements for the past 2 months: [place]                                      Prior Living Arrangements/Services Living arrangements for the  past 2 months: [place] Lives with:: Facility Resident Patient language and need for interpreter reviewed:: Yes        Need for Family Participation in Patient Care: Yes (Comment) Care giver support system in place?: Yes (comment)   Criminal Activity/Legal Involvement Pertinent to Current Situation/Hospitalization: No - Comment as needed  Activities of Daily Living Home Assistive Devices/Equipment: None ADL Screening (condition at time of admission) Patient's cognitive ability adequate to safely complete daily activities?: No Is the patient deaf or have difficulty hearing?: No Does the patient have difficulty seeing, even when wearing glasses/contacts?: No Does the patient have difficulty concentrating, remembering, or making decisions?: Yes Patient able to express need for assistance with ADLs?: No Does the patient have difficulty dressing or bathing?: Yes Independently performs ADLs?: No Communication: Independent Dressing (OT): Needs assistance Is this a change from baseline?: Pre-admission baseline Grooming: Needs assistance Is this a change from baseline?: Pre-admission baseline Feeding: Needs assistance Is this a change from baseline?: Pre-admission baseline Bathing: Needs assistance Is this a change from baseline?: Pre-admission baseline Toileting: Needs assistance Is this a change from baseline?: Pre-admission baseline In/Out Bed: Needs assistance Is this a change from baseline?: Pre-admission baseline Walks in Home: Needs assistance Is this a change from baseline?: Pre-admission baseline Does the patient have difficulty walking or climbing  stairs?: No Weakness of Legs: None Weakness of Arms/Hands: None  Permission Sought/Granted Permission sought to share information with : Family Supports Permission granted to share information with : No  Share Information with NAME: Valecia Beske     Permission granted to share info w Relationship: husband and  HCPOA     Emotional Assessment Appearance:: Appears stated age Attitude/Demeanor/Rapport: Unable to Assess Affect (typically observed): Other (comment) (Incongruent) Orientation: : Oriented to Self Alcohol / Substance Use: Not Applicable Psych Involvement: Yes (comment)  Admission diagnosis:  Lower urinary tract infectious disease [N39.0] Agitation [R45.1] Altered mental status, unspecified altered mental status type [R41.82] Agitation due to dementia [facility]) [F03.91] Patient Active Problem List   Diagnosis Date Noted  . Agitation due to dementia ([place]) 07/08/2019  . Alkaline phosphatase elevation 01/19/2017  . Muscular deconditioning 01/08/2017  . Acute respiratory failure ([place])   . Attention deficit disorder   . Seizures ([place])   . Elevated blood pressure reading   . Leukocytosis   . Hypokalemia   . Supplemental oxygen dependent   . Status epilepticus ([place]) 12/26/2016  . Altered mental state   . Rash 12/17/2016  . Cough 05/22/2016  . Coronary artery disease involving native coronary artery of native heart with angina pectoris ([place]) 04/14/2016  . Dyspnea on exertion 01/28/2016  . Bradycardia 01/28/2016  . Colon cancer screening 01/17/2016  . Medicare welcome visit 11/20/2015  . Advance care planning 11/20/2015  . SOB (shortness of breath) on exertion 11/20/2015  . Agitation 10/16/2014  . Edema leg 07/09/2014  . Back pain 11/14/2012  . Hot flashes 05/24/2012  . Orthostasis 11/24/2011  . Dementia in Alzheimer's disease ([place]) 09/20/2011  . IBS (irritable bowel syndrome) 01/04/2011  . Vitamin D deficiency 10/04/2009  . FATIGUE 03/15/2009  . POSTMENOPAUSAL BLEEDING 05/05/2008  . HYPERLIPIDEMIA 10/14/2007  . SCOLIOSIS 10/14/2007  . Depression with anxiety 07/11/2007  . OSTEOPOROSIS 07/11/2007   PCP:  Patient, No Pcp Per Pharmacy:   [facility] [place], [place]  [address] Phone: [phone] Fax: [phone]  [place], [place] [address] [place] [address] Phone: [phone] Fax: [phone]     Social Determinants of Health (SDOH) Interventions    Readmission Risk Interventions No flowsheet data found.
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
## 2019-07-12 ENCOUNTER — Encounter (HOSPITAL_COMMUNITY): Payer: Self-pay | Admitting: Internal Medicine

## 2019-07-12 DIAGNOSIS — R451 Restlessness and agitation: Secondary | ICD-10-CM | POA: Diagnosis not present

## 2019-07-12 DIAGNOSIS — G309 Alzheimer's disease, unspecified: Secondary | ICD-10-CM | POA: Diagnosis not present

## 2019-07-12 DIAGNOSIS — R569 Unspecified convulsions: Secondary | ICD-10-CM | POA: Diagnosis not present

## 2019-07-12 DIAGNOSIS — F0391 Unspecified dementia with behavioral disturbance: Secondary | ICD-10-CM | POA: Diagnosis not present

## 2019-07-12 LAB — MRSA PCR SCREENING: MRSA by PCR: NEGATIVE

## 2019-07-12 NOTE — TOC Progression Note (Signed)
Transition of Care [facility]) - Progression Note    Patient Details  Name: Lisa Mclaughlin MRN: [id number] Date of Birth: 22-Aug-1950  Transition of Care [facility]) CM/SW [place], [place] Phone Number: 07/12/2019, 10:45 AM  Clinical Narrative:   Followed up on referrals sent yesterday afternoon.  [facility] is currently at capacity.  [facility] Fabio Neighbors will review and call back, as will Mikel Cella, as will [place] [facility] due to seizures and residing in ALF.    Expected Discharge Plan: Psychiatric Hospital Barriers to Discharge: Psych Bed not available  Expected Discharge Plan and Services Expected Discharge Plan: [place] arrangements for the past 2 months: [place]                                       Social Determinants of Health (SDOH) Interventions    Readmission Risk Interventions No flowsheet data found.

## 2019-07-12 NOTE — Progress Notes (Signed)
Patient keeps getting out of bed and pulling at IV.  AC notified by charge nurse. Safety sitter returned to assist with patient.
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
## 2019-07-13 DIAGNOSIS — F0391 Unspecified dementia with behavioral disturbance: Secondary | ICD-10-CM | POA: Diagnosis not present

## 2019-07-13 DIAGNOSIS — G309 Alzheimer's disease, unspecified: Secondary | ICD-10-CM | POA: Diagnosis not present

## 2019-07-13 DIAGNOSIS — R569 Unspecified convulsions: Secondary | ICD-10-CM | POA: Diagnosis not present

## 2019-07-13 DIAGNOSIS — R451 Restlessness and agitation: Secondary | ICD-10-CM | POA: Diagnosis not present

## 2019-07-13 NOTE — Plan of Care (Signed)
  Problem: Clinical Measurements: Goal: Will remain free from infection Outcome: Progressing   Problem: Nutrition: Goal: Adequate nutrition will be maintained Outcome: Progressing   
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
## 2019-07-14 DIAGNOSIS — R569 Unspecified convulsions: Secondary | ICD-10-CM | POA: Diagnosis not present

## 2019-07-14 DIAGNOSIS — E876 Hypokalemia: Secondary | ICD-10-CM

## 2019-07-14 DIAGNOSIS — I10 Essential (primary) hypertension: Secondary | ICD-10-CM | POA: Diagnosis not present

## 2019-07-14 DIAGNOSIS — I2510 Atherosclerotic heart disease of native coronary artery without angina pectoris: Secondary | ICD-10-CM

## 2019-07-14 DIAGNOSIS — F0391 Unspecified dementia with behavioral disturbance: Secondary | ICD-10-CM | POA: Diagnosis not present

## 2019-07-14 LAB — LEVETIRACETAM LEVEL: Levetiracetam Lvl: 57.5 ug/mL — ABNORMAL HIGH (ref 10.0–40.0)
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
## 2019-07-14 NOTE — TOC Progression Note (Signed)
Transition of Care [facility]) - Progression Note    Patient Details  Name: Lisa Mclaughlin MRN: [id number] Date of Birth: [birth year]  [facility]) CM/SW Contact  Ross Ludwig, [place] Phone Number: 07/14/2019, 5:01 PM  Clinical Narrative:     CSW spoke to patient's husband who was at bedside, he would like CSW to contact [employer].  CSW faxed referral waiting for call back.  CSW also resent information to [facility], and [place].  CSW waiting on decision from [place] facilities.  Expected Discharge Plan: Psychiatric Hospital Barriers to Discharge: Psych Bed not available  Expected Discharge Plan and Services Expected Discharge Plan: [place] arrangements for the past 2 months: [place]                                       Social Determinants of Health (SDOH) Interventions    Readmission Risk Interventions No flowsheet data found.

## 2019-07-14 NOTE — Plan of Care (Signed)
?  Problem: Clinical Measurements: ?Goal: Will remain free from infection ?Outcome: Progressing ?  ?

## 2019-07-14 NOTE — Plan of Care (Signed)
  Problem: Health Behavior/Discharge Planning: Goal: Ability to manage health-related needs will improve Outcome: Not Progressing   Problem: Clinical Measurements: Goal: Will remain free from infection Outcome: Progressing   Problem: Nutrition: Goal: Adequate nutrition will be maintained Outcome: Progressing

## 2019-07-14 NOTE — Care Management Important Message (Signed)
Important Message  Patient Details IM Letter presented to the Patient Name: Lisa Mclaughlin MRN: [id number] Date of Birth: [birth year]   Medicare Important Message Given:  Yes     Kerin Salen 07/14/2019, 11:00 AM

## 2019-07-15 ENCOUNTER — Encounter (HOSPITAL_COMMUNITY): Payer: Self-pay | Admitting: Internal Medicine

## 2019-07-15 DIAGNOSIS — R451 Restlessness and agitation: Secondary | ICD-10-CM | POA: Diagnosis not present
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
## 2019-07-15 NOTE — TOC Progression Note (Signed)
Transition of Care [facility]) - Progression Note    Patient Details  Name: Lisa Mclaughlin MRN: [id number] Date of Birth: [birth year]  Transition [facility] Care [facility]) CM/SW [place], [place] Phone Number: 07/15/2019, 11:40 AM  Clinical Narrative:   Lisa Mclaughlin in follow up on referral sent yesterday.  Has not yet been reviewed by Dr.  [name] call back.    Expected Discharge Plan: Psychiatric Hospital Barriers to Discharge: Psych Bed not available  Expected Discharge Plan and Services Expected Discharge Plan: [place] arrangements for the past 2 months: [place]                                       Social Determinants of Health (SDOH) Interventions    Readmission Risk Interventions No flowsheet data found.

## 2019-07-15 NOTE — Progress Notes (Signed)
BP is not a resting state when BP was taken. This nurse assisted NT in calming patient down to get new BP and it was 141/60. Will continue to monitor the patient.

## 2019-07-16 DIAGNOSIS — R451 Restlessness and agitation: Secondary | ICD-10-CM | POA: Diagnosis not present

## 2019-07-16 LAB — BASIC METABOLIC PANEL
Anion gap: 11 (ref 5–15)
BUN: 9 mg/dL (ref 8–23)
CO2: 22 mmol/L (ref 22–32)
Calcium: 9.1 mg/dL (ref 8.9–10.3)
Chloride: 108 mmol/L (ref 98–111)
Creatinine, Ser: 0.52 mg/dL (ref 0.44–1.00)
GFR calc Af Amer: >60 mL/min (ref >60)
GFR calc non Af Amer: >60 mL/min (ref >60)
Glucose, Bld: 92 mg/dL (ref 70–99)
Potassium: 3.8 mmol/L (ref 3.5–5.1)
Sodium: 141 mmol/L (ref 135–145)

## 2019-07-16 MED ORDER — HYDROXYZINE HCL 25 MG PO TABS
25.0000 mg | ORAL_TABLET | Freq: Every day | ORAL | Status: DC
  Administered 2019-07-16 – 2019-08-03 (×19): 25 mg via ORAL
  Filled 2019-07-16 (×19): qty 1
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
## 2019-07-16 NOTE — TOC Progression Note (Addendum)
Transition of Care [facility]) - Progression Note    Patient Details  Name: Belva Koziel MRN: [id number] Date of Birth: [birth year]  Transition of Care [facility]) CM/SW [place], [place] Phone Number: 07/16/2019, 10:08 AM  Clinical Narrative:   [facility] in follow up to updated clinicals FAXed yesterday, spoke with Caryl Pina who stated "Our second shift called yesterday and were told she had been discharged.  Please resend referral."  Spoke with Cedric at [facility] who is unable to see updated clinicals from yesterday; asked that I resend. Both resent. TOC will continue to follow during the course of hospitalization.  [facility] called back saying they need update psychiatry note to proceed with referral.  Hospitalist notified.     Expected Discharge Plan: Psychiatric Hospital Barriers to Discharge: Psych Bed not available  Expected Discharge Plan and Services Expected Discharge Plan: [place] arrangements for the past 2 months: [place]                                       Social Determinants of Health (SDOH) Interventions    Readmission Risk Interventions No flowsheet data found.

## 2019-07-16 NOTE — Progress Notes (Signed)
Patient vital signs are not at a resting state. Patient moving all over the bed and moving her arm while trying to get blood pressure. Recheck BP manually and this LPN got 871/99.

## 2019-07-17 DIAGNOSIS — R569 Unspecified convulsions: Secondary | ICD-10-CM | POA: Diagnosis not present

## 2019-07-17 DIAGNOSIS — E876 Hypokalemia: Secondary | ICD-10-CM | POA: Diagnosis not present

## 2019-07-17 DIAGNOSIS — I10 Essential (primary) hypertension: Secondary | ICD-10-CM | POA: Diagnosis not present

## 2019-07-17 DIAGNOSIS — R451 Restlessness and agitation: Secondary | ICD-10-CM | POA: Diagnosis not present

## 2019-07-17 LAB — VALPROIC ACID LEVEL: Valproic Acid Lvl: 16 ug/mL — ABNORMAL LOW (ref 50.0–100.0)

## 2019-07-17 MED ORDER — DIVALPROEX SODIUM 125 MG PO CSDR
250.0000 mg | DELAYED_RELEASE_CAPSULE | Freq: Two times a day (BID) | ORAL | Status: DC
  Administered 2019-07-17 – 2019-08-27 (×83): 250 mg via ORAL
  Filled 2019-07-17 (×84): qty 2
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
## 2019-07-17 NOTE — Care Management Important Message (Signed)
Important Message  Patient Details IM Letter given to Roque Lias SW Case Manager to present to the Patient Name: Lisa Mclaughlin MRN: [id number] Date of Birth: 1950-03-11   Medicare Important Message Given:  Yes     Kerin Salen 07/17/2019, 10:45 AM

## 2019-07-17 NOTE — TOC Progression Note (Signed)
Transition of Care [facility]) - Progression Note    Patient Details  Name: Lisa Mclaughlin MRN: [id number] Date of Birth: 1950-09-16  Transition of Care [facility]) CM/SW [place], [place] Phone Number: 07/17/2019, 9:41 AM  Clinical Narrative:   Patient seen by psych this AM.  Will FAX note to both [place] and [place] when available.  In meantime, called Cedric at [facility] to find out about bed availability this AM, he was unable to find my FAX from yesterday in his queue.  Re-FAXed 2 latest MD notes and left message for him. TOC will continue to follow during the course of hospitalization.      Expected Discharge Plan: Psychiatric Hospital Barriers to Discharge: Psych Bed not available  Expected Discharge Plan and Services Expected Discharge Plan: [place] arrangements for the past 2 months: [place]                                       Social Determinants of Health (SDOH) Interventions    Readmission Risk Interventions No flowsheet data found.

## 2019-07-18 DIAGNOSIS — E876 Hypokalemia: Secondary | ICD-10-CM | POA: Diagnosis not present

## 2019-07-18 DIAGNOSIS — R569 Unspecified convulsions: Secondary | ICD-10-CM | POA: Diagnosis not present

## 2019-07-18 DIAGNOSIS — R451 Restlessness and agitation: Secondary | ICD-10-CM | POA: Diagnosis not present

## 2019-07-18 DIAGNOSIS — I10 Essential (primary) hypertension: Secondary | ICD-10-CM | POA: Diagnosis not present

## 2019-07-18 NOTE — TOC Progression Note (Addendum)
Transition of Care [facility]) - Progression Note    Patient Details  Name: Lisa Mclaughlin MRN: [id number] Date of Birth: [birth year]  Transition of Care [facility]) CM/SW [place], [place] Phone Number: 07/18/2019, 9:53 AM  Clinical Narrative:   Left message for both [place] and [facility]. TOC will continue to follow during the course of hospitalization.  Addendum:  Heard back from [place] at [facility], who raised concerns of placement following hospitalization and criteria for admission other than medication management since it appears she is no longer having outbursts/intrusive behaviors exhibited at her facility.  I let him know that her facility is willing to consider taking her back if she is medically stabilized by them, and that other than periodic agitation and a sitter, we have not seen the previous behaviors this week.  He will staff and get back with me.  Addendum:  Have not heard back from [facility] [place].  Called to [facility] again and spoke with Stanton Kidney, who states they did receive psych note yesterday, but case has not yet been reviewed by Dr.  I asked if that could happen today, and she assured me it could.  Stated she would call me back with results of review while also warning me that if patient is found to be appropriate, would still require updated labs, chest X-ray, etc., etc.     Expected Discharge Plan: Psychiatric Hospital Barriers to Discharge: Psych Bed not available  Expected Discharge Plan and Services Expected Discharge Plan: [place] arrangements for the past 2 months: [place]                                       Social Determinants of Health (SDOH) Interventions    Readmission Risk Interventions No flowsheet data found.
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
## 2019-07-19 DIAGNOSIS — F0391 Unspecified dementia with behavioral disturbance: Secondary | ICD-10-CM | POA: Diagnosis not present

## 2019-07-19 DIAGNOSIS — R451 Restlessness and agitation: Secondary | ICD-10-CM | POA: Diagnosis not present

## 2019-07-19 DIAGNOSIS — G309 Alzheimer's disease, unspecified: Secondary | ICD-10-CM | POA: Diagnosis not present

## 2019-07-19 DIAGNOSIS — R569 Unspecified convulsions: Secondary | ICD-10-CM | POA: Diagnosis not present
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
## 2019-07-20 DIAGNOSIS — F0391 Unspecified dementia with behavioral disturbance: Secondary | ICD-10-CM | POA: Diagnosis not present

## 2019-07-20 DIAGNOSIS — R569 Unspecified convulsions: Secondary | ICD-10-CM | POA: Diagnosis not present

## 2019-07-20 DIAGNOSIS — G309 Alzheimer's disease, unspecified: Secondary | ICD-10-CM | POA: Diagnosis not present

## 2019-07-20 DIAGNOSIS — R451 Restlessness and agitation: Secondary | ICD-10-CM | POA: Diagnosis not present

## 2019-07-20 MED ORDER — QUETIAPINE FUMARATE 25 MG PO TABS
50.0000 mg | ORAL_TABLET | Freq: Two times a day (BID) | ORAL | Status: DC
  Administered 2019-07-20 – 2019-08-03 (×29): 50 mg via ORAL
  Filled 2019-07-20 (×29): qty 2
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
## 2019-07-21 ENCOUNTER — Inpatient Hospital Stay (HOSPITAL_COMMUNITY): Payer: Medicare Other

## 2019-07-21 ENCOUNTER — Encounter (HOSPITAL_COMMUNITY): Payer: Self-pay | Admitting: Internal Medicine

## 2019-07-21 DIAGNOSIS — R451 Restlessness and agitation: Secondary | ICD-10-CM | POA: Diagnosis not present

## 2019-07-21 DIAGNOSIS — R4182 Altered mental status, unspecified: Secondary | ICD-10-CM

## 2019-07-21 LAB — COMPREHENSIVE METABOLIC PANEL
ALT: 44 U/L (ref 0–44)
AST: 23 U/L (ref 15–41)
Albumin: 3.7 g/dL (ref 3.5–5.0)
Alkaline Phosphatase: 74 U/L (ref 38–126)
Anion gap: 9 (ref 5–15)
BUN: 16 mg/dL (ref 8–23)
CO2: 24 mmol/L (ref 22–32)
Calcium: 9.3 mg/dL (ref 8.9–10.3)
Chloride: 107 mmol/L (ref 98–111)
Creatinine, Ser: 0.76 mg/dL (ref 0.44–1.00)
GFR calc Af Amer: >60 mL/min (ref >60)
GFR calc non Af Amer: >60 mL/min (ref >60)
Glucose, Bld: 89 mg/dL (ref 70–99)
Potassium: 3.9 mmol/L (ref 3.5–5.1)
Sodium: 140 mmol/L (ref 135–145)
Total Bilirubin: 0.4 mg/dL (ref 0.3–1.2)
Total Protein: 6.5 g/dL (ref 6.5–8.1)

## 2019-07-21 LAB — CBC WITH DIFFERENTIAL/PLATELET
Abs Immature Granulocytes: 0.02 10*3/uL (ref 0.00–0.07)
Basophils Absolute: 0 10*3/uL (ref 0.0–0.1)
Basophils Relative: 1 %
Eosinophils Absolute: 0.1 10*3/uL (ref 0.0–0.5)
Eosinophils Relative: 3 %
HCT: 37.4 % (ref 36.0–46.0)
Hemoglobin: 11.9 g/dL — ABNORMAL LOW (ref 12.0–15.0)
Immature Granulocytes: 0 %
Lymphocytes Relative: 30 %
Lymphs Abs: 1.7 10*3/uL (ref 0.7–4.0)
MCH: 29.2 pg (ref 26.0–34.0)
MCHC: 31.8 g/dL (ref 30.0–36.0)
MCV: 91.9 fL (ref 80.0–100.0)
Monocytes Absolute: 0.7 10*3/uL (ref 0.1–1.0)
Monocytes Relative: 13 %
Neutro Abs: 3.1 10*3/uL (ref 1.7–7.7)
Neutrophils Relative %: 53 %
Platelets: 275 10*3/uL (ref 150–400)
RBC: 4.07 MIL/uL (ref 3.87–5.11)
RDW: 13.8 % (ref 11.5–15.5)
WBC: 5.7 10*3/uL (ref 4.0–10.5)
nRBC: 0 % (ref 0.0–0.2)

## 2019-07-21 LAB — VALPROIC ACID LEVEL: Valproic Acid Lvl: 17 ug/mL — ABNORMAL LOW (ref 50.0–100.0)

## 2019-07-21 NOTE — TOC Progression Note (Addendum)
Transition of Care [facility]) - Progression Note    Patient Details  Name: Lisa Mclaughlin MRN: [id number] Date of Birth: 28-Oct-1950  Transition of Care [facility]) CM/SW [place], [place] Phone Number: 07/21/2019, 9:59 AM  Clinical Narrative:   Damaris Schooner with MD who stated he would request another psychiatric consult.  Spoke to husband, who asked that I follow up with [facility], [facility] Fabio Neighbors and [name] will continue to follow during the course of hospitalization.  Addendum:  Patient is psych cleared.  Sent out bed search to SNF memory care units, applied for PASSR     Expected Discharge Plan: Psychiatric Hospital Barriers to Discharge: Psych Bed not available  Expected Discharge Plan and Services Expected Discharge Plan: [facility]       Living arrangements for the past 2 months: [place]                                       Social Determinants of Health (SDOH) Interventions    Readmission Risk Interventions No flowsheet data found.

## 2019-07-21 NOTE — NC FL2 (Signed)
[place] LEVEL OF CARE SCREENING TOOL     IDENTIFICATION  Patient Name: Lisa Mclaughlin Birthdate: 09-Jan-1950 Sex: female Admission Date (Current Location): 07/08/2019  [facility] and [place] Number:  Herbalist and Address:  [facility],  [place] [address], [place]      Provider Number: [id number]  Attending Physician Name and Address:  Antonieta Pert, MD  Relative Name and Phone Number:  Bahja Bence [phone]  husband    Current Level of Care: Hospital Recommended Level of Care: [place], Memory Care Prior Approval Number:    Date Approved/Denied:   PASRR Number:    Discharge Plan: SNF    Current Diagnoses: Patient Active Problem List   Diagnosis Date Noted  . Agitation due to dementia ([place]) 07/08/2019  . Alkaline phosphatase elevation 01/19/2017  . Muscular deconditioning 01/08/2017  . Acute respiratory failure ([place])   . Attention deficit disorder   . Seizures ([place])   . Elevated blood pressure reading   . Leukocytosis   . Hypokalemia   . Supplemental oxygen dependent   . Status epilepticus ([place]) 12/26/2016  . Altered mental state   . Rash 12/17/2016  . Cough 05/22/2016  . Coronary artery disease involving native coronary artery of native heart with angina pectoris ([place]) 04/14/2016  . Dyspnea on exertion 01/28/2016  . Bradycardia 01/28/2016  . Colon cancer screening 01/17/2016  . Medicare welcome visit 11/20/2015  . Advance care planning 11/20/2015  . SOB (shortness of breath) on exertion 11/20/2015  . Agitation 10/16/2014  . Edema leg 07/09/2014  . Back pain 11/14/2012  . Hot flashes 05/24/2012  . Orthostasis 11/24/2011  . Dementia in Alzheimer's disease ([place]) 09/20/2011  . IBS (irritable bowel syndrome) 01/04/2011  . Vitamin D deficiency 10/04/2009  . FATIGUE 03/15/2009  . POSTMENOPAUSAL BLEEDING 05/05/2008  . HYPERLIPIDEMIA 10/14/2007  . SCOLIOSIS 10/14/2007  . Depression with anxiety 07/11/2007   . OSTEOPOROSIS 07/11/2007    Orientation RESPIRATION BLADDER Height & Weight     Self  Normal Continent Weight: 58.4 kg Height:  5\' 1"  (154.9 cm)  BEHAVIORAL SYMPTOMS/MOOD NEUROLOGICAL BOWEL NUTRITION STATUS   (None in hospital)  (none) Continent Diet (see d/c summary)  AMBULATORY STATUS COMMUNICATION OF NEEDS Skin   Supervision   Normal                       Personal Care Assistance Level of Assistance  Bathing, Feeding, Dressing Bathing Assistance: Limited assistance Feeding assistance: Independent Dressing Assistance: Limited assistance     Functional Limitations Info  Sight, Hearing, Speech Sight Info: Adequate Hearing Info: Adequate Speech Info: Adequate    SPECIAL CARE FACTORS FREQUENCY                       Contractures Contractures Info: Not present    Additional Factors Info  Code Status, Allergies Code Status Info: DNR Allergies Info: [name], Wellbutrin, Gluten meal, Tetracycline Hcl,           Current Medications (07/21/2019):  This is the current hospital active medication list Current Facility-Administered Medications  Medication Dose Route Frequency Provider Last Rate Last Admin  . 0.9 %  sodium chloride infusion   Intravenous Continuous Etta Quill, DO 75 mL/hr at 07/18/19 1905 New Bag at 07/18/19 1905  . acetaminophen (TYLENOL) tablet 650 mg  650 mg Oral Q6H PRN Etta Quill, DO       Or  .  acetaminophen (TYLENOL) suppository 650 mg  650 mg Rectal Q6H PRN Etta Quill, DO      . aspirin chewable tablet 81 mg  81 mg Oral Daily Jennette Kettle M, DO   81 mg at 07/21/19 1047  . carvedilol (COREG) tablet 3.125 mg  3.125 mg Oral BID WC Jennette Kettle M, DO   3.125 mg at 07/18/19 0856  . cholecalciferol (VITAMIN D3) tablet 5,000 Units  5,000 Units Oral Daily Etta Quill, DO   5,000 Units at 07/21/19 1048  . divalproex (DEPAKOTE SPRINKLE) capsule 250 mg  250 mg Oral Q12H Suella Broad, FNP   250 mg at 07/21/19 1049   . enoxaparin (LOVENOX) injection 40 mg  40 mg Subcutaneous Q24H Jennette Kettle M, DO   40 mg at 07/20/19 2114  . haloperidol lactate (HALDOL) injection 1-2 mg  1-2 mg Intravenous Q6H PRN Etta Quill, DO   2 mg at 07/11/19 1825  . hydrOXYzine (ATARAX/VISTARIL) tablet 25 mg  25 mg Oral QHS Guilford Shi, MD   25 mg at 07/20/19 2115  . ibuprofen (ADVIL) tablet 200 mg  200 mg Oral Q6H PRN Etta Quill, DO      . levETIRAcetam (KEPPRA) 100 MG/ML solution 1,500 mg  1,500 mg Oral BID Polly Cobia, RPH   1,500 mg at 07/21/19 1053  . melatonin tablet 10 mg  10 mg Oral QHS Jennette Kettle M, DO   10 mg at 07/20/19 2115  . multivitamin with minerals tablet 1 tablet  1 tablet Oral Daily Etta Quill, DO   1 tablet at 07/21/19 1046  . ondansetron (ZOFRAN) tablet 4 mg  4 mg Oral Q6H PRN Etta Quill, DO       Or  . ondansetron [facility]) injection 4 mg  4 mg Intravenous Q6H PRN Etta Quill, DO      . QUEtiapine (SEROQUEL) tablet 12.5 mg  12.5 mg Oral Q4H PRN Etta Quill, DO   12.5 mg at 07/18/19 2226  . QUEtiapine (SEROQUEL) tablet 50 mg  50 mg Oral BID Marcell Anger, MD   50 mg at 07/21/19 1046     Discharge Medications: Please see discharge summary for a list of discharge medications.  Relevant Imaging Results:  Relevant Lab Results:   Additional Information SS#: [id number]  Trish Mage, LCSW

## 2019-07-21 NOTE — Care Management Important Message (Signed)
Important Message  Patient Details IM Letter given to Roque Lias SW Case Manager to present to the Patient Name: Lisa Mclaughlin MRN: [id number] Date of Birth: April 18, 1950   Medicare Important Message Given:  Yes     Kerin Salen 07/21/2019, 11:03 AM
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
## 2019-07-21 NOTE — Consult Note (Signed)
[facility] Face-to-Face Psychiatry Consult   Reason for Consult:  Agitation Referring Physician:  Antonieta Pert, MD Patient Identification: Lisa Mclaughlin MRN:  [id number] Principal Diagnosis: Agitation Diagnosis:  Principal Problem:   Agitation Active Problems:   Dementia in Alzheimer's disease ([place])   Seizures ([place])   Agitation due to dementia [facility])   Total Time spent with patient: 30 minutes  Subjective:   Lisa Mclaughlin is a 69 y.o. female patient admitted [facility] with complaints of worsening agitation and aggressive behavior.  Patient has a significant history of advanced dementia from early onset alzheimer's dz, CAD, seizure disorder  HPI:  Lisa Mclaughlin, 69 y.o., female patient seen face to face by this provider, consulted with Dr. Dwyane Dee; and chart reviewed on 07/21/19.  On evaluation Lisa Mclaughlin sitting in bed; pleasant mood.  He husband is at bedside Lisa Mclaughlin).  Patient unable to participate in assessment (unable to answer questions); repeatedly saying "I gottcha" and other comments irrelevant to assessment or not understandable.  Husband states that patient has been doing much better and there has been a decrease in episodes of agitation and yelling out loud.  States that they have been in the hospital for 2 weeks waiting for geropsychiatric placement but feels that the patient is doing better and feels that she would do better if she was in skilled nursing.  States patient was at assisted living (memory care) but feels that a higher level of care is needed.  Husband states since starting the medications she is doing much better.   Spoke with nursing and social work who also informs that patient has been doing better, no agitation and calmer mood; and that patient would benefit from higher level of care (skilled nursing facility).       Past Psychiatric History: see above  Risk to Self:  No Risk to Others:  No Prior Inpatient Therapy:  No Prior Outpatient Therapy:  No  Past Medical History:  Past  Medical History:  Diagnosis Date  . ADD (attention deficit disorder with hyperactivity)    adult  . Alzheimer's disease, early onset ([place])   . Depression   . History of anemia   . Osteopenia 2002   dexa  . Osteoporosis   . Scoliosis   . Seizure [facility])     Past Surgical History:  Procedure Laterality Date  . BREAST ENHANCEMENT SURGERY    . CARDIAC EVENT MONITOR  02/2016   Mostly sinus rhythm. Lowest heart rate recorded was 50 BPM. No A. fib noted. Heart rate did achieve the rate of 110 bpm. Lightheadedness and dizziness was associated with mostly sinus rhythm with occasional PVCs or mild sinus tachycardia. No pauses greater than 3 seconds noted.   Marland Kitchen CESAREAN SECTION     x 3  . CORNARY CTA-FFR  03/31/2016   distal LAD stenosis with an FFR of 0.77. No left main, circumflex or RCA disease noted.. --> PLAN - medical management  . DILATION AND CURETTAGE OF UTERUS  2015  . NASAL SINUS SURGERY    . NM MYOVIEW LTD  06/2004   No infarct or Ischemia  . Pelvic ultrasound  05/10   thickened endometrium and ? polyp vs fibroid  . TRANSTHORACIC ECHOCARDIOGRAM  02/11/2016   Normal LV size and function. EF 60-65%. No comment on diastolic function or any significant valvular lesion.   Family History:  Family History  Problem Relation Age of Onset  . Heart disease Mother        CAD  . Alzheimer's  disease Mother        Died, 54  . Heart disease Father         CAD MI at age 35  . Hypertension Father   . Hypertension Sister   . Cancer Other        leukemia  . Diabetes Sister   . Cancer Other        breast  . Breast cancer Other   . Colon cancer Neg Hx    Family Psychiatric  History: Denies Social History:  Social History   Substance and Sexual Activity  Alcohol Use Yes  . Alcohol/week: 0.0 standard drinks   Comment: occassionally     Social History   Substance and Sexual Activity  Drug Use No    Social History   Socioeconomic History  . Marital status: Married    Spouse  name: Not on file  . Number of children: 3  . Years of education: Not on file  . Highest education level: Not on file  Occupational History  . Occupation: Works in [place]: 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  Tobacco Use  . Smoking status: Former Smoker    Quit date: 09/27/1979    Years since quitting: 39.8  . Smokeless tobacco: Never Used  Vaping Use  . Vaping Use: Never used  Substance and Sexual Activity  . Alcohol use: Yes    Alcohol/week: 0.0 standard drinks    Comment: occassionally  . Drug use: No  . Sexual activity: Not on file  Other Topics Concern  . 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 Difficulty of Paying Living Expenses:   Food Insecurity:   . Worried About Charity fundraiser in the Last Year:   . Arboriculturist in the Last Year:   Transportation Needs:   . Film/video editor (Medical):   Marland Kitchen Lack of Transportation (Non-Medical):   Physical Activity:   . Days of Exercise per Week:   . Minutes of Exercise per Session:   Stress:   . Feeling of Stress :   Social Connections:   . Frequency of Communication with Friends and Family:   . Frequency of Social Gatherings with Friends and Family:   . Attends Religious Services:   . Active Member of Clubs or Organizations:   . Attends Archivist Meetings:   Marland Kitchen Marital Status:    Additional Social History:    Allergies:   Allergies  Allergen Reactions  . [name] [Aripiprazole] Other (See Comments)    Seizure hx  . Wellbutrin [Bupropion] Other (See Comments)    Seizure hx  . Gluten Meal Other (See Comments)    Allergic sensitivity  . Other Other (See Comments)    Allergic sensitivity-Food allergies: almond, banana, casein, cheese, cola, egg white, flaxseed, gluten, malt, cow and goat milks, mushrooms,  pineapple, salmon, sesame, [place], wheat, whey, bakers and brewers yeast, yogurt.   . Tetracycline Hcl Other (See Comments)    unknown    Labs:  Results for orders placed or performed during the hospital encounter of 07/08/19 (from the past 48 hour(s))  CBC with Differential/Platelet     Status: Abnormal   Collection Time: 07/21/19  4:28 AM  Result Value Ref Range   WBC  5.7 4.0 - 10.5 K/uL   RBC 4.07 3.87 - 5.11 MIL/uL   Hemoglobin 11.9 (L) 12.0 - 15.0 g/dL   HCT 37.4 36 - 46 %   MCV 91.9 80.0 - 100.0 fL   MCH 29.2 26.0 - 34.0 pg   MCHC 31.8 30.0 - 36.0 g/dL   RDW 13.8 11.5 - 15.5 %   Platelets 275 150 - 400 K/uL   nRBC 0.0 0.0 - 0.2 %   Neutrophils Relative % 53 %   Neutro Abs 3.1 1.7 - 7.7 K/uL   Lymphocytes Relative 30 %   Lymphs Abs 1.7 0.7 - 4.0 K/uL   Monocytes Relative 13 %   Monocytes Absolute 0.7 0 - 1 K/uL   Eosinophils Relative 3 %   Eosinophils Absolute 0.1 0 - 0 K/uL   Basophils Relative 1 %   Basophils Absolute 0.0 0 - 0 K/uL   Immature Granulocytes 0 %   Abs Immature Granulocytes 0.02 0.00 - 0.07 K/uL    Comment: Performed at [facility], [place] [address], [place], [place] [address]  Comprehensive metabolic panel     Status: None   Collection Time: 07/21/19  4:28 AM  Result Value Ref Range   Sodium 140 135 - 145 mmol/L   Potassium 3.9 3.5 - 5.1 mmol/L   Chloride 107 98 - 111 mmol/L   CO2 24 22 - 32 mmol/L   Glucose, Bld 89 70 - 99 mg/dL    Comment: Glucose reference range applies only to samples taken after fasting for at least 8 hours.   BUN 16 8 - 23 mg/dL   Creatinine, Ser 0.76 0.44 - 1.00 mg/dL   Calcium 9.3 8.9 - 10.3 mg/dL   Total Protein 6.5 6.5 - 8.1 g/dL   Albumin 3.7 3.5 - 5.0 g/dL   AST 23 15 - 41 U/L   ALT 44 0 - 44 U/L   Alkaline Phosphatase 74 38 - 126 U/L   Total Bilirubin 0.4 0.3 - 1.2 mg/dL   GFR calc non Af Amer >60 >60 mL/min   GFR calc Af Amer >60 >60 mL/min   Anion gap 9 5 - 15    Comment: Performed at [facility], [place] [address] [place], [place] [address]  Valproic acid level     Status: Abnormal   Collection Time: 07/21/19  4:28 AM  Result Value Ref Range   Valproic Acid Lvl 17 (L) 50.0 - 100.0 ug/mL    Comment: Performed at [facility], [place] [address] [place], [place] [address]    Current Facility-Administered Medications  Medication Dose Route Frequency Provider Last Rate Last Admin  . 0.9 %  sodium chloride infusion   Intravenous Continuous Etta Quill, DO 75 mL/hr at 07/18/19 1905 New Bag at 07/18/19 1905  . acetaminophen (TYLENOL) tablet 650 mg  650 mg Oral Q6H PRN Etta Quill, DO       Or  . acetaminophen (TYLENOL) suppository 650 mg  650 mg Rectal Q6H PRN Etta Quill, DO      . aspirin chewable tablet 81 mg  81 mg Oral Daily Jennette Kettle M, DO   81 mg at 07/21/19 1047  . carvedilol (COREG) tablet 3.125 mg  3.125 mg Oral BID WC Jennette Kettle M, DO   3.125 mg at 07/18/19 0856  . cholecalciferol (VITAMIN D3) tablet 5,000 Units  5,000 Units Oral Daily Etta Quill, DO   5,000 Units at 07/21/19 1048  .  divalproex (DEPAKOTE SPRINKLE) capsule 250 mg  250 mg Oral Q12H Suella Broad, FNP   250 mg at 07/21/19 1049  . enoxaparin (LOVENOX) injection 40 mg  40 mg Subcutaneous Q24H Jennette Kettle M, DO   40 mg at 07/20/19 2114  . haloperidol lactate (HALDOL) injection 1-2 mg  1-2 mg Intravenous Q6H PRN Etta Quill, DO   2 mg at 07/11/19 1825  . hydrOXYzine (ATARAX/VISTARIL) tablet 25 mg  25 mg Oral QHS Guilford Shi, MD   25 mg at 07/20/19 2115  . ibuprofen (ADVIL) tablet 200 mg  200 mg Oral Q6H PRN Etta Quill, DO      . levETIRAcetam (KEPPRA) 100 MG/ML solution 1,500 mg  1,500 mg Oral BID Polly Cobia, RPH   1,500 mg at 07/21/19 1053  . melatonin tablet 10 mg  10 mg Oral QHS Jennette Kettle M, DO   10 mg at 07/20/19 2115  . multivitamin with minerals tablet 1 tablet  1 tablet Oral Daily Etta Quill, DO   1  tablet at 07/21/19 1046  . ondansetron (ZOFRAN) tablet 4 mg  4 mg Oral Q6H PRN Etta Quill, DO       Or  . ondansetron [facility]) injection 4 mg  4 mg Intravenous Q6H PRN Etta Quill, DO      . QUEtiapine (SEROQUEL) tablet 12.5 mg  12.5 mg Oral Q4H PRN Etta Quill, DO   12.5 mg at 07/18/19 2226  . QUEtiapine (SEROQUEL) tablet 50 mg  50 mg Oral BID Marcell Anger, MD   50 mg at 07/21/19 1046    Musculoskeletal: Strength & Muscle Tone: within normal limits Gait & Station: did not see patient ambulate Patient leans: N/A  Psychiatric Specialty Exam: Physical Exam  Review of Systems  Blood pressure (!) 92/57, pulse (!) 51, temperature 98.3 F (36.8 C), temperature source Axillary, resp. rate 18, height 5\' 1"  (1.549 m), weight 58.4 kg, SpO2 97 %.Body mass index is 24.33 kg/m.  General Appearance: Casual; hospital gown  Eye Contact:  Good  Speech:  Clear at times; mostly couldn't understand  Volume:  Normal  Mood:  Pleasant, smiling  Affect:  Appropriate  Thought Process:  Linear and Descriptions of Associations: Loose  Orientation:  Other:  person  Thought Content:  Unable to assess  Suicidal Thoughts:  No  Homicidal Thoughts:  No  Memory:  Immediate;   Poor Recent;   Poor Remote;   Poor  Judgement:  Impaired  Insight:  Unable to assess  Psychomotor Activity:  Normal  Concentration:  Concentration: Poor and Attention Span: Poor  Recall:  Poor  Fund of Knowledge:  Poor  Language:  Poor  Akathisia:  No  Handed:  Right  AIMS (if indicated):     Assets:  Financial Resources/Insurance Social Support  ADL's:  Impaired  Cognition:  Impaired,  Moderate  Sleep:        Treatment Plan Summary: Plan Psychiatrically cleared.  Social work/Transition will assist family with skilled nursing facility placement.  Social work will also assist with setting patient up with psychiatric services for medication management.  Patient to continue current psychotropic  medications  Disposition:  Psychiatrically cleared No evidence of imminent risk to self or others at present.   Patient does not meet criteria for psychiatric inpatient admission. Supportive therapy provided about ongoing stressors. Discussed crisis plan, support from social network, calling 911, coming to the Emergency Department, and calling Suicide Hotline.   Message sent  to Dr. Maren Beach, Lupita Leash informing:  Patient seen today and psychiatrically cleared.  Spoke with husband and social work and feel that patient will benefit for higher level of care been waiting on gero psych bed for 2 weeks and hasn't been accepted anywhere.  Patient can continue current psychotropic medications. Social work will assist with (SNF) placement and setting up with outpatient psychiatric services for medication management.   Lisa Croson, NP 07/21/2019 4:17 PM

## 2019-07-22 DIAGNOSIS — R451 Restlessness and agitation: Secondary | ICD-10-CM | POA: Diagnosis not present

## 2019-07-22 NOTE — Progress Notes (Signed)
Patient is pleasantly confused, but cooperative.  She ambulates with supervision, is easily redirectable.  No aggressive behaviors observed.  Episodes of agitation easily modified with diversional, non-pharmaceutical measures, such as music, walking and relaxation techniques.  Virginia Rochester, RN
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
## 2019-07-22 NOTE — TOC Progression Note (Signed)
Transition of Care [facility]) - Progression Note    Patient Details  Name: Lisa Mclaughlin MRN: [id number] Date of Birth: Apr 14, 1950  [facility] CM/SW Contact  Joaquin Courts, RN Phone Number: 07/22/2019, 4:21 PM  Clinical Narrative:    CM spoke with spouse again regarding bed offer. Spouse accepts bed offer from [facility].  CM spoke with facility rep who states that after reviewing the chart in detail and speaking with the spouse, facility is concerned about some if the behaviors that patient has exhibited and admissions rep will now need to present the case to the director to see if facility can still accept patient.  At this time it remains unclear if facility will accept patient, there are no other bed offers at this time.      Expected Discharge Plan: Skilled Nursing Facility Barriers to Discharge: Psych Bed not available  Expected Discharge Plan and Services Expected Discharge Plan: [place] Choice: [place] arrangements for the past 2 months: Assisted Living Facility                                       Social Determinants of Health (SDOH) Interventions    Readmission Risk Interventions No flowsheet data found.

## 2019-07-22 NOTE — TOC Progression Note (Signed)
Transition of Care [facility]) - Progression Note    Patient Details  Name: Lisa Mclaughlin MRN: [id number] Date of Birth: [birth year]  Transition of Care [facility]) CM/SW Contact  Joaquin Courts, RN Phone Number: 07/22/2019, 10:02 AM  Clinical Narrative:  CM spoke with patient's spouse and provided bed offer.  At this time patient has one bed offer and this was communicated to the spouse.  Spouse reports he is unfamiliar with the facility and would like to at least speak with a rep and drive out to see the location before definitively accepting the offer.  Further spouse requests an expanded search beyond the initial fax out.  Patient re-faxed out to  Facilities in [place], [place], [place], [place], and surrounding areas.  CM did discuss with spouse that given the patient's dementia and care needs, it is possible that her current bed offer may remain the only bed offer and spouse will need to prepare for this possibility.  Spouse to investigate facility that extended bed offer with a plan to speak with CM later today.  CM will continue to follow.     Expected Discharge Plan: Skilled Nursing Facility Barriers to Discharge: Psych Bed not available  Expected Discharge Plan and Services Expected Discharge Plan: [place] Choice: [place] arrangements for the past 2 months: Assisted Living Facility                                       Social Determinants of Health (SDOH) Interventions    Readmission Risk Interventions No flowsheet data found.

## 2019-07-22 NOTE — Progress Notes (Signed)
Pt has been put to bed after walking many laps on the unit and sitting with staff at the desk. 10pm meds needed to be given at [place] in order to help pt unwind and start to get sleepy. No sitter available, Bed alarm on

## 2019-07-23 DIAGNOSIS — G309 Alzheimer's disease, unspecified: Secondary | ICD-10-CM | POA: Diagnosis not present

## 2019-07-23 DIAGNOSIS — F0280 Dementia in other diseases classified elsewhere without behavioral disturbance: Secondary | ICD-10-CM | POA: Diagnosis not present

## 2019-07-23 NOTE — Progress Notes (Signed)
PT Cancellation Note  Patient Details Name: Lisa Mclaughlin MRN: [id number] DOB: 19-Jun-1950   Cancelled Treatment:    Reason Eval/Treat Not Completed: Attempted PT eval-pt unwilling to participate with PT on today. Husband present during session. He tried to get pt to participate but she continued to decline. Will attempt again on tomrrow. Husband requested PT try again in a.m    Doreatha Massed, PT Acute Rehabilitation  Office: [phone] Pager: [phone]

## 2019-07-23 NOTE — Progress Notes (Signed)
Pt is asleep

## 2019-07-23 NOTE — TOC Progression Note (Addendum)
Transition of Care [facility]) - Progression Note    Patient Details  Name: Earnstine Meinders MRN: [id number] Date of Birth: 1950/09/24  Transition of Care [facility]) CM/SW Contact  Ross Ludwig, [place] Phone Number: 07/23/2019, 12:54 PM  Clinical Narrative:     CSW contacted [facility], they are still reviewing patient's information.  CSW continuing to follow patient's progression.  2:00pm  CSW spoke to [employer] [facility], they are still reviewing patient's information.  SNF requested that PT see her to see if she would benefit from some rehab.  CSW asked physician to order PT.  Patient also needs a [facility] number, per [facility], they requested clinicals to be faxed to them, CSW faxed requested clinicals.  CSW to continue to follow patient's progress throughout discharge planning.  Patient will also need insurance approval for her to go to SNF.  CSW updated patient's husband on progress being made with SNF placement.  CSW continuing to follow patient's progress throughout discharge planning.   Expected Discharge Plan: Skilled Nursing Facility Barriers to Discharge: Psych Bed not available  Expected Discharge Plan and Services Expected Discharge Plan: [place] Choice: [place] arrangements for the past 2 months: Assisted Living Facility                                       Social Determinants of Health (SDOH) Interventions    Readmission Risk Interventions No flowsheet data found.

## 2019-07-23 NOTE — Clinical Social Work Note (Signed)
Dementia [facility] note  RE: Lisa Mclaughlin        Date of Birth: 05-Jun-1950    Date: 07-23-2019        To Whom It May Concern:  Please be advised that the above-named patient has a primary diagnosis of dementia which supersedes any psychiatric diagnosis.  Evette Cristal, MSW, [place] Mike [phone]

## 2019-07-23 NOTE — NC FL2 (Signed)
[place] LEVEL OF CARE SCREENING TOOL     IDENTIFICATION  Patient Name: Lisa Mclaughlin Birthdate: 08/04/1950 Sex: female Admission Date (Current Location): 07/08/2019  [facility] and [place] Number:  Herbalist and Address:  [facility],  [place], [place]      Provider Number: [id number]  Attending Physician Name and Address:  Bonnielee Haff, MD  Relative Name and Phone Number:  Man Bonneau [phone]  husband    Current Level of Care: Hospital Recommended Level of Care: [place], Memory Care Prior Approval Number:    Date Approved/Denied:   PASRR Number: Pending  Discharge Plan: SNF    Current Diagnoses: Patient Active Problem List   Diagnosis Date Noted  . Agitation due to dementia ([place]) 07/08/2019  . Alkaline phosphatase elevation 01/19/2017  . Muscular deconditioning 01/08/2017  . Acute respiratory failure ([place])   . Attention deficit disorder   . Seizures ([place])   . Elevated blood pressure reading   . Leukocytosis   . Hypokalemia   . Supplemental oxygen dependent   . Status epilepticus ([place]) 12/26/2016  . Altered mental state   . Rash 12/17/2016  . Cough 05/22/2016  . Coronary artery disease involving native coronary artery of native heart with angina pectoris ([place]) 04/14/2016  . Dyspnea on exertion 01/28/2016  . Bradycardia 01/28/2016  . Colon cancer screening 01/17/2016  . Medicare welcome visit 11/20/2015  . Advance care planning 11/20/2015  . SOB (shortness of breath) on exertion 11/20/2015  . Agitation 10/16/2014  . Edema leg 07/09/2014  . Back pain 11/14/2012  . Hot flashes 05/24/2012  . Orthostasis 11/24/2011  . Dementia in Alzheimer's disease ([place]) 09/20/2011  . IBS (irritable bowel syndrome) 01/04/2011  . Vitamin D deficiency 10/04/2009  . FATIGUE 03/15/2009  . POSTMENOPAUSAL BLEEDING 05/05/2008  . HYPERLIPIDEMIA 10/14/2007  . SCOLIOSIS 10/14/2007  . Depression with anxiety  07/11/2007  . OSTEOPOROSIS 07/11/2007    Orientation RESPIRATION BLADDER Height & Weight     Self  Normal Continent Weight: 128 lb 12 oz (58.4 kg) Height:  5\' 1"  (154.9 cm)  BEHAVIORAL SYMPTOMS/MOOD NEUROLOGICAL BOWEL NUTRITION STATUS   (None in hospital)  (none) Continent Diet (Cardiac diet)  AMBULATORY STATUS COMMUNICATION OF NEEDS Skin   Supervision Verbally Normal                       Personal Care Assistance Level of Assistance  Bathing, Feeding, Dressing Bathing Assistance: Limited assistance Feeding assistance: Independent Dressing Assistance: Limited assistance     Functional Limitations Info  Sight, Hearing, Speech Sight Info: Adequate Hearing Info: Adequate Speech Info: Adequate    SPECIAL CARE FACTORS FREQUENCY  PT (By licensed PT), OT (By licensed OT)     PT Frequency: Minimum 5x a week OT Frequency: Minimum 5x a week            Contractures Contractures Info: Not present    Additional Factors Info  Code Status, Allergies, Psychotropic Code Status Info: dnr Allergies Info: [name], Wellbutrin, Gluten meal, Tetracycline Hcl, Psychotropic Info: divalproex (DEPAKOTE SPRINKLE) capsule 250 mg         Current Medications (07/23/2019):  This is the current hospital active medication list Current Facility-Administered Medications  Medication Dose Route Frequency Provider Last Rate Last Admin  . acetaminophen (TYLENOL) tablet 650 mg  650 mg Oral Q6H PRN Etta Quill, DO       Or  . acetaminophen (TYLENOL) suppository 650  mg  650 mg Rectal Q6H PRN Etta Quill, DO      . aspirin chewable tablet 81 mg  81 mg Oral Daily Etta Quill, DO   81 mg at 07/23/19 0916  . carvedilol (COREG) tablet 3.125 mg  3.125 mg Oral BID WC Jennette Kettle M, DO   3.125 mg at 07/23/19 0916  . cholecalciferol (VITAMIN D3) tablet 5,000 Units  5,000 Units Oral Daily Etta Quill, DO   5,000 Units at 07/23/19 0916  . divalproex (DEPAKOTE SPRINKLE) capsule 250 mg   250 mg Oral Q12H Suella Broad, FNP   250 mg at 07/23/19 0916  . enoxaparin (LOVENOX) injection 40 mg  40 mg Subcutaneous Q24H Jennette Kettle M, DO   40 mg at 07/21/19 2111  . haloperidol lactate (HALDOL) injection 1-2 mg  1-2 mg Intravenous Q6H PRN Etta Quill, DO   2 mg at 07/11/19 1825  . hydrOXYzine (ATARAX/VISTARIL) tablet 25 mg  25 mg Oral QHS Gu[place] Shi, MD   25 mg at 07/22/19 1945  . ibuprofen (ADVIL) tablet 200 mg  200 mg Oral Q6H PRN Etta Quill, DO   200 mg at 07/21/19 1928  . levETIRAcetam (KEPPRA) 100 MG/ML solution 1,500 mg  1,500 mg Oral BID Polly Cobia, RPH   1,500 mg at 07/23/19 3704  . melatonin tablet 10 mg  10 mg Oral QHS Jennette Kettle M, DO   10 mg at 07/22/19 1945  . multivitamin with minerals tablet 1 tablet  1 tablet Oral Daily Etta Quill, DO   1 tablet at 07/23/19 0916  . ondansetron (ZOFRAN) tablet 4 mg  4 mg Oral Q6H PRN Etta Quill, DO       Or  . ondansetron [facility]) injection 4 mg  4 mg Intravenous Q6H PRN Etta Quill, DO      . QUEtiapine (SEROQUEL) tablet 12.5 mg  12.5 mg Oral Q4H PRN Etta Quill, DO   12.5 mg at 07/18/19 2226  . QUEtiapine (SEROQUEL) tablet 50 mg  50 mg Oral BID Marcell Anger, MD   50 mg at 07/23/19 8889     Discharge Medications: Please see discharge summary for a list of discharge medications.  Relevant Imaging Results:  Relevant Lab Results:   Additional Information SS#: [id number]  Ross Ludwig, LCSW
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
## 2019-07-24 DIAGNOSIS — F0280 Dementia in other diseases classified elsewhere without behavioral disturbance: Secondary | ICD-10-CM | POA: Diagnosis not present

## 2019-07-24 DIAGNOSIS — G309 Alzheimer's disease, unspecified: Secondary | ICD-10-CM | POA: Diagnosis not present
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
## 2019-07-24 NOTE — Progress Notes (Addendum)
Rounding on unit. Introduced spiritual care as support due to LOS.  Provided brief support with pt's spouse at bedside. Spouse reports supporting pt through 10 years of dementia and spoke with chaplain about the transitions to their next steps in their journey with her illness.   Understands how to contact spiritual care for support needs 24/7.   Will follow up during rounds for additional support.
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
## 2019-07-25 DIAGNOSIS — F0280 Dementia in other diseases classified elsewhere without behavioral disturbance: Secondary | ICD-10-CM | POA: Diagnosis not present

## 2019-07-25 DIAGNOSIS — G309 Alzheimer's disease, unspecified: Secondary | ICD-10-CM | POA: Diagnosis not present

## 2019-07-25 NOTE — Progress Notes (Signed)
Refused am vitals
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
## 2019-07-25 NOTE — TOC Progression Note (Signed)
Transition of Care [facility]) - Progression Note    Patient Details  Name: Lisa Mclaughlin MRN: [id number] Date of Birth: 11-06-1950  [facility] of [facility] CM/SW Contact  Ross Ludwig, [place] Phone Number: 07/25/2019, 10:29 AM  Clinical Narrative:    CSW started insurance authorization for patient, reference number is [id number].  Patient has a bed at [facility] pending final approval from the SNF administration team.  CSW informed Mendel Corning, that insurance authorization was started via voice mail.  CSW to continue to follow patient's progress throughout discharge planning.  Patient's [employer] number has been received, Passar number is [id number].  CSW awaiting to hear back from insurance company.  CSW to continue to follow patient's progress throughout discharge planning.   Expected Discharge Plan: Skilled Nursing Facility Barriers to Discharge: Psych Bed not available  Expected Discharge Plan and Services Expected Discharge Plan: [place] Choice: [place] arrangements for the past 2 months: Assisted Living Facility                                       Social Determinants of Health (SDOH) Interventions    Readmission Risk Interventions No flowsheet data found.

## 2019-07-26 DIAGNOSIS — G309 Alzheimer's disease, unspecified: Secondary | ICD-10-CM | POA: Diagnosis not present

## 2019-07-26 DIAGNOSIS — F0280 Dementia in other diseases classified elsewhere without behavioral disturbance: Secondary | ICD-10-CM | POA: Diagnosis not present

## 2019-07-26 LAB — BASIC METABOLIC PANEL
Anion gap: 11 (ref 5–15)
BUN: 24 mg/dL — ABNORMAL HIGH (ref 8–23)
CO2: 23 mmol/L (ref 22–32)
Calcium: 9.4 mg/dL (ref 8.9–10.3)
Chloride: 107 mmol/L (ref 98–111)
Creatinine, Ser: 0.83 mg/dL (ref 0.44–1.00)
GFR calc Af Amer: >60 mL/min (ref >60)
GFR calc non Af Amer: >60 mL/min (ref >60)
Glucose, Bld: 91 mg/dL (ref 70–99)
Potassium: 4 mmol/L (ref 3.5–5.1)
Sodium: 141 mmol/L (ref 135–145)

## 2019-07-26 LAB — CBC
HCT: 41.2 % (ref 36.0–46.0)
Hemoglobin: 12.7 g/dL (ref 12.0–15.0)
MCH: 29.1 pg (ref 26.0–34.0)
MCHC: 30.8 g/dL (ref 30.0–36.0)
MCV: 94.3 fL (ref 80.0–100.0)
Platelets: 303 10*3/uL (ref 150–400)
RBC: 4.37 MIL/uL (ref 3.87–5.11)
RDW: 13.8 % (ref 11.5–15.5)
WBC: 5.1 10*3/uL (ref 4.0–10.5)
nRBC: 0 % (ref 0.0–0.2)
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
## 2019-07-27 DIAGNOSIS — F0280 Dementia in other diseases classified elsewhere without behavioral disturbance: Secondary | ICD-10-CM | POA: Diagnosis not present

## 2019-07-27 DIAGNOSIS — G309 Alzheimer's disease, unspecified: Secondary | ICD-10-CM | POA: Diagnosis not present
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
## 2019-07-28 DIAGNOSIS — G309 Alzheimer's disease, unspecified: Secondary | ICD-10-CM | POA: Diagnosis not present

## 2019-07-28 DIAGNOSIS — F0280 Dementia in other diseases classified elsewhere without behavioral disturbance: Secondary | ICD-10-CM | POA: Diagnosis not present
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
## 2019-07-28 NOTE — TOC Progression Note (Signed)
Transition of Care [facility]) - Progression Note    Patient Details  Name: [name] MRN: [id number] Date of Birth: November 19, 1950  Transition of Care [facility]) CM/SW [place], [place] Phone Number: 07/28/2019, 2:56 PM  Clinical Narrative:   Patient was denied authorization by [facility] for rehab.  Husband informed. Texted Freda Munro at [facility].  The two of them need to talk to work out particulars of self pay agreement. Asked MD for COVID order.  Hope to move patient tomorrow. TOC will continue to follow during the course of hospitalization.     Expected Discharge Plan: Skilled Nursing Facility Barriers to Discharge: Other (comment) (Husband and facility working out particulars of self pay)  Expected Discharge Plan and Services Expected Discharge Plan: [place] Choice: [place] arrangements for the past 2 months: Assisted Living Facility                                       Social Determinants of Health (SDOH) Interventions    Readmission Risk Interventions No flowsheet data found.

## 2019-07-29 LAB — SARS CORONAVIRUS 2 BY RT PCR (HOSPITAL ORDER, PERFORMED IN ~~LOC~~ HOSPITAL LAB): SARS Coronavirus 2: NEGATIVE

## 2019-07-29 MED ORDER — QUETIAPINE FUMARATE 50 MG PO TABS: 50.0000 mg | ORAL_TABLET | Freq: Two times a day (BID) | ORAL | Status: DC

## 2019-07-29 MED ORDER — HYDROXYZINE HCL 25 MG PO TABS: 25.0000 mg | ORAL_TABLET | Freq: Every day | ORAL | 0 refills | Status: AC

## 2019-07-29 MED ORDER — LEVETIRACETAM 100 MG/ML PO SOLN: 1500.0000 mg | Freq: Two times a day (BID) | ORAL | 12 refills | Status: AC

## 2019-07-29 MED ORDER — DIVALPROEX SODIUM 125 MG PO CSDR: 250.0000 mg | DELAYED_RELEASE_CAPSULE | Freq: Two times a day (BID) | ORAL | Status: AC
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
## 2019-07-29 NOTE — Discharge Instructions (Signed)
Delirium Delirium is a state of mental confusion. It comes on quickly and causes significant changes in a person's thinking and behavior. People with delirium usually have trouble paying attention to what is going on or knowing where they are. They may become very withdrawn or very emotional and unable to sit still. They may even see or feel things that are not there (hallucinations). Delirium is a sign of a serious underlying medical condition. What are the causes? Delirium occurs when something suddenly affects the signals that the brain sends out. Brain signals can be affected by anything that puts severe stress on the body and brain and causes brain chemicals to be out of balance. The most common causes of delirium include:  Infections. These may be bacterial, viral, fungal, or protozoal.  Medicines. These include many over-the-counter and prescription medicines.  Recreational drugs.  Substance withdrawal. This occurs with sudden discontinuation of alcohol, certain medicines, or recreational drugs.  Surgery and anesthesia.  Sudden vascular events, such as stroke and brain hemorrhage.  Other brain disorders, such as migraines, tumors, seizures, and physical head trauma.  Metabolic disorders, such as kidney or liver failure.  Low blood oxygen (anoxia). This may occur with lung disease, cardiac arrest, or carbon monoxide poisoning.  Hormone imbalances (endocrinopathies), such as an overactive thyroid (hyperthyroidism) or underactive thyroid (hypothyroidism).  Vitamin deficiencies. What increases the risk? The following factors may make someone more likely to develop this condition.  Being a child.  Being an older person.  Living alone.  Having vision loss or hearing loss.  Having an existing brain disease, such as dementia.  Having long-lasting (chronic) medical conditions, such as heart disease.  Being hospitalized for long periods of time. What are the signs or  symptoms? Delirium starts with a sudden change in a person's thinking or behavior. Symptoms include:  Not being able to stay awake (drowsiness) or pay attention.  Being confused about places, time, and people.  Forgetfulness.  Having extreme energy levels. These may be low or high.  Changes in sleep patterns.  Extreme mood swings, such as sudden anger or anxiety.  Focusing on things or ideas that are not important.  Rambling and senseless talking.  Difficulty speaking, understanding speech, or both.  Hallucinations.  Tremor or unsteady gait. Symptoms come and go (fluctuate) over time, and they are often worse at the end of the day. How is this diagnosed? People with delirium may not realize that they have the condition. Often, a family member or health care provider is the first person to notice the changes. This condition may be diagnosed based on a physical exam, health history, and tests.  The health care provider will obtain a detailed history. This may include questions about: ? Current symptoms. ? Medical issues. ? Medicines. ? Recreational drug use.  The health care provider will perform a mental status examination by: ? Asking questions to check for confusion. ? Watching for abnormal behavior.  The health care provider may also order lab tests or additional studies to determine the cause of the delirium. How is this treated? Treatment of delirium depends on the cause and severity. Delirium usually goes away within days or weeks of treating the underlying cause. In the meantime, do not leave the person alone because he or she may accidentally cause self-harm. This condition may be treated with supportive care, such as:  Increased light during the day and decreased light at night.  Low noise level.  Uninterrupted sleep.  A regular daily schedule.    Clocks and calendars to help with orientation.  Familiar objects, including the person's pictures and  clothing.  Frequent visits from familiar family and friends.  A healthy diet.  Gentle exercise. In more severe cases of delirium, medicine may be prescribed to help the person keep calm and think more clearly. Follow these instructions at home:  Continue supportive care as told by a health care provider.  Over-the-counter and prescription medicines should be taken only as told by a health care provider.  Ask a health care provider before using herbs or supplements.  Do not use alcohol or recreational drugs.  Keep all follow-up visits as told by a health care provider. This is important. Contact a health care provider if:  Symptoms do not get better or they become worse.  New symptoms of delirium develop.  Caring for the person at home does not seem safe.  Eating, drinking, or communicating stops.  There are side effects of medicines, such as changes in sleep patterns, dizziness, weight gain, restlessness, movement changes, or tremors. Get help right away if:  Serious thoughts occur about self-harm or about hurting others.  There are serious side effects of medicine, such as: ? Swelling of the face, lips, tongue, or throat. ? Fever, confusion, muscle spasms, or seizures. Summary  Delirium is a state of mental confusion. It comes on quickly and causes significant changes in a person's thinking and behavior.  Delirium is a sign of a serious underlying medical condition.  Certain medical conditions or a long hospital stay may increase the risk of developing delirium.  Treatment of delirium involves treating the underlying cause and providing supportive treatments, such as a calm and familiar environment. This information is not intended to replace advice given to you by your health care provider. Make sure you discuss any questions you have with your health care provider. Document Revised: 08/09/2017 Document Reviewed: 08/09/2017 Elsevier Patient Education  2020 Elsevier  Inc.  

## 2019-07-30 DIAGNOSIS — R451 Restlessness and agitation: Secondary | ICD-10-CM | POA: Diagnosis not present
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
## 2019-07-31 NOTE — Progress Notes (Signed)
[facility] PROGRESS NOTE  Patient: Lisa Mclaughlin [id number]   PCP: Patient, No Pcp Per DOB: 1950-12-06   DOA: 07/08/2019   DOS: 07/31/2019    Subjective: No acute events.  Ambulating in the hallway.  Objective:  Vitals:   07/31/19 1438 07/31/19 1801  BP: 116/77 (!) 122/63  Pulse: 65 59  Resp: 16   Temp: 98.1 F (36.7 C)   SpO2: 100% 96%    Refused examination.  Assessment and plan: Unable to find a facility that will accept the patient.  Social worker consult appreciated.  Medically remained stable for discharge.  Author: Berle Mull, MD [facility] 07/31/2019 6:36 PM   If 7PM-7AM, please contact night-coverage at [email or web address]

## 2019-07-31 NOTE — TOC Progression Note (Signed)
Transition of Care [facility]) - Progression Note    Patient Details  Name: Lisa Mclaughlin MRN: [id number] Date of Birth: Nov 24, 1950  Transition of Care [facility]) CM/SW [place], [place] Phone Number: 07/31/2019, 4:37 PM  Clinical Narrative:   [facility] referral unraveled.  Spoke with husband about moving forward.  He is open to referrals to at ALF's.  I have spoken with both [place] and [place], and both are considering.  Husband wants me to call [facility] to find out about criteria for admission there. Will follow up.    Expected Discharge Plan: Skilled Nursing Facility Barriers to Discharge: Other (comment) (Husband and facility working out particulars of self pay)  Expected Discharge Plan and Services Expected Discharge Plan: [place] Choice: [place] arrangements for the past 2 months: [place] Expected Discharge Date: 07/29/19                                     Social Determinants of Health (SDOH) Interventions    Readmission Risk Interventions No flowsheet data found.

## 2019-08-01 NOTE — Progress Notes (Signed)
[facility] PROGRESS NOTE  Patient: Lisa Mclaughlin PXT:[id number]   PCP: Patient, No Pcp Per DOB: 1950-01-13   DOA: 07/08/2019   DOS: 08/01/2019    Subjective: No nausea no vomiting.  No fever no chills.  Objective:  Vitals:   08/01/19 1056 08/01/19 1651  BP: (!) 127/62 120/75  Pulse: 51 52  Resp: 18 16  Temp: 98.5 F (36.9 C)   SpO2: 98% 100%    Refused examination.  Assessment and plan: Unable to find a facility that will accept the patient.  Social worker consult appreciated.  Medically remained stable for discharge.  Author: Berle Mull, MD [facility] 08/01/2019 6:15 PM   If 7PM-7AM, please contact night-coverage at [email or web address]

## 2019-08-01 NOTE — TOC Progression Note (Addendum)
Transition of Care [facility]) - Progression Note    Patient Details  Name: Lisa Mclaughlin MRN: [id number] Date of Birth: 03-13-1950  Transition of Care [facility] [place]) CM/SW [place], [place] Phone Number: 08/01/2019, 8:20 AM  Clinical Narrative:   [facility] would be an option if patient is qualified for MCD or husband pays 22K per month.  Spoke with husband, who plans to [employer] about qualifying.  In meantime, continue to work on local placement at local ALF memory care. TOC will continue to follow during the course of hospitalization.  Melissa from [facility] will visit on Monday for screening.     Expected Discharge Plan: Skilled Nursing Facility Barriers to Discharge: Other (comment) (Husband and facility working out particulars of self pay)  Expected Discharge Plan and Services Expected Discharge Plan: [place] Choice: [place] arrangements for the past 2 months: [place] Expected Discharge Date: 07/29/19                                     Social Determinants of Health (SDOH) Interventions    Readmission Risk Interventions No flowsheet data found.

## 2019-08-02 LAB — COMPREHENSIVE METABOLIC PANEL
ALT: 107 U/L — ABNORMAL HIGH (ref 0–44)
AST: 44 U/L — ABNORMAL HIGH (ref 15–41)
Albumin: 4.3 g/dL (ref 3.5–5.0)
Alkaline Phosphatase: 114 U/L (ref 38–126)
Anion gap: 13 (ref 5–15)
BUN: 39 mg/dL — ABNORMAL HIGH (ref 8–23)
CO2: 25 mmol/L (ref 22–32)
Calcium: 10 mg/dL (ref 8.9–10.3)
Chloride: 109 mmol/L (ref 98–111)
Creatinine, Ser: 0.89 mg/dL (ref 0.44–1.00)
GFR calc Af Amer: >60 mL/min (ref >60)
GFR calc non Af Amer: >60 mL/min (ref >60)
Glucose, Bld: 107 mg/dL — ABNORMAL HIGH (ref 70–99)
Potassium: 4.1 mmol/L (ref 3.5–5.1)
Sodium: 147 mmol/L — ABNORMAL HIGH (ref 135–145)
Total Bilirubin: 0.7 mg/dL (ref 0.3–1.2)
Total Protein: 8.4 g/dL — ABNORMAL HIGH (ref 6.5–8.1)

## 2019-08-02 LAB — CBC WITH DIFFERENTIAL/PLATELET
Abs Immature Granulocytes: 0 10*3/uL (ref 0.00–0.07)
Basophils Absolute: 0.1 10*3/uL (ref 0.0–0.1)
Basophils Relative: 2 %
Eosinophils Absolute: 1.9 10*3/uL — ABNORMAL HIGH (ref 0.0–0.5)
Eosinophils Relative: 48 %
HCT: 46.4 % — ABNORMAL HIGH (ref 36.0–46.0)
Hemoglobin: 14.8 g/dL (ref 12.0–15.0)
Immature Granulocytes: 0 %
Lymphocytes Relative: 19 %
Lymphs Abs: 0.7 10*3/uL (ref 0.7–4.0)
MCH: 29.6 pg (ref 26.0–34.0)
MCHC: 31.9 g/dL (ref 30.0–36.0)
MCV: 92.8 fL (ref 80.0–100.0)
Monocytes Absolute: 0.2 10*3/uL (ref 0.1–1.0)
Monocytes Relative: 4 %
Neutro Abs: 1.1 10*3/uL — ABNORMAL LOW (ref 1.7–7.7)
Neutrophils Relative %: 27 %
Platelets: 333 10*3/uL (ref 150–400)
RBC: 5 MIL/uL (ref 3.87–5.11)
RDW: 13.6 % (ref 11.5–15.5)
WBC: 4 10*3/uL (ref 4.0–10.5)
nRBC: 0 % (ref 0.0–0.2)

## 2019-08-02 LAB — MAGNESIUM: Magnesium: 2.5 mg/dL — ABNORMAL HIGH (ref 1.7–2.4)

## 2019-08-02 NOTE — Progress Notes (Signed)
[facility] PROGRESS NOTE  Patient: Lisa Mclaughlin LTY:[id number]   PCP: Patient, No Pcp Per DOB: 06-Apr-1950   DOA: 07/08/2019   DOS: 08/02/2019    Subjective: Per RN patient is more sleepy.  No nausea no vomiting.  Patient was easily arousable on my evaluation.  Denies any complaint.  Objective:  Vitals:   08/02/19 0522 08/02/19 1401  BP: (!) 136/67 113/72  Pulse: 61 86  Resp: 20 16  Temp: 98.2 F (36.8 C) 97.7 F (36.5 C)  SpO2: 100% 98%    Clear to auscultation.  S1-S2 present.  Assessment and plan: Advance Alzheimer's dementia with behavioral disturbances. Currently stable. No agitation. Continue current regimen. Not requiring any sitter.  Seizure disorder. Stable, no seizures. Monitor.   Author: Berle Mull, MD [facility] 08/02/2019 5:51 PM   If 7PM-7AM, please contact night-coverage at [email or web address]

## 2019-08-03 NOTE — Progress Notes (Signed)
[facility] PROGRESS NOTE  Patient: Lisa Mclaughlin SWH:[id number]   PCP: Patient, No Pcp Per DOB: March 14, 1950   DOA: 07/08/2019   DOS: 08/03/2019    Subjective: No acute event.  No nausea no vomiting.  Stable.  Objective:  Vitals:   08/03/19 0838 08/03/19 1312  BP: (!) 114/61 115/77  Pulse: 73 64  Resp:  16  Temp:  (!) 97.5 F (36.4 C)  SpO2:  93%    Assessment and plan: Advance Alzheimer's dementia with behavioral disturbances. Currently stable. No agitation. Continue current regimen. Not requiring any sitter.  Seizure disorder. Stable, no seizures. Monitor.   Author: Berle Mull, MD [facility] 08/03/2019 5:22 PM   If 7PM-7AM, please contact night-coverage at [email or web address]

## 2019-08-04 LAB — BASIC METABOLIC PANEL
Anion gap: 14 (ref 5–15)
Anion gap: 10 (ref 5–15)
Anion gap: 12 (ref 5–15)
BUN: 40 mg/dL — ABNORMAL HIGH (ref 8–23)
BUN: 41 mg/dL — ABNORMAL HIGH (ref 8–23)
BUN: 41 mg/dL — ABNORMAL HIGH (ref 8–23)
CO2: 26 mmol/L (ref 22–32)
CO2: 28 mmol/L (ref 22–32)
CO2: 26 mmol/L (ref 22–32)
Calcium: 10.5 mg/dL — ABNORMAL HIGH (ref 8.9–10.3)
Calcium: 10.5 mg/dL — ABNORMAL HIGH (ref 8.9–10.3)
Calcium: 9.8 mg/dL (ref 8.9–10.3)
Chloride: 111 mmol/L (ref 98–111)
Chloride: 111 mmol/L (ref 98–111)
Chloride: 110 mmol/L (ref 98–111)
Creatinine, Ser: 0.89 mg/dL (ref 0.44–1.00)
Creatinine, Ser: 0.94 mg/dL (ref 0.44–1.00)
Creatinine, Ser: 0.76 mg/dL (ref 0.44–1.00)
GFR calc Af Amer: >60 mL/min (ref >60)
GFR calc Af Amer: >60 mL/min (ref >60)
GFR calc Af Amer: >60 mL/min (ref >60)
GFR calc non Af Amer: >60 mL/min (ref >60)
GFR calc non Af Amer: >60 mL/min (ref >60)
GFR calc non Af Amer: >60 mL/min (ref >60)
Glucose, Bld: 110 mg/dL — ABNORMAL HIGH (ref 70–99)
Glucose, Bld: 113 mg/dL — ABNORMAL HIGH (ref 70–99)
Glucose, Bld: 103 mg/dL — ABNORMAL HIGH (ref 70–99)
Potassium: 4.5 mmol/L (ref 3.5–5.1)
Potassium: 4.7 mmol/L (ref 3.5–5.1)
Potassium: 4.4 mmol/L (ref 3.5–5.1)
Sodium: 151 mmol/L — ABNORMAL HIGH (ref 135–145)
Sodium: 149 mmol/L — ABNORMAL HIGH (ref 135–145)
Sodium: 148 mmol/L — ABNORMAL HIGH (ref 135–145)

## 2019-08-04 MED ORDER — QUETIAPINE FUMARATE 25 MG PO TABS
50.0000 mg | ORAL_TABLET | Freq: Every day | ORAL | Status: DC
  Administered 2019-08-04 – 2019-08-27 (×24): 50 mg via ORAL
  Filled 2019-08-04 (×25): qty 2

## 2019-08-04 MED ORDER — ENSURE ENLIVE PO LIQD
237.0000 mL | Freq: Two times a day (BID) | ORAL | Status: DC
  Administered 2019-08-04 – 2019-08-19 (×26): 237 mL via ORAL
  Administered 2019-08-20: 40 mL via ORAL
  Administered 2019-08-21 – 2019-08-27 (×13): 237 mL via ORAL

## 2019-08-04 MED ORDER — QUETIAPINE FUMARATE 25 MG PO TABS: 25.0000 mg | ORAL_TABLET | Freq: Every morning | ORAL | Status: DC

## 2019-08-04 MED ORDER — DEXTROSE-NACL 5-0.45 % IV SOLN: INTRAVENOUS | Status: DC

## 2019-08-04 MED ORDER — HYDROXYZINE HCL 25 MG PO TABS
25.0000 mg | ORAL_TABLET | Freq: Every day | ORAL | Status: DC
  Administered 2019-08-04 – 2019-08-27 (×24): 25 mg via ORAL
  Filled 2019-08-04 (×24): qty 1

## 2019-08-04 NOTE — Progress Notes (Signed)
[facility] Progress Note  Patient: Lisa Mclaughlin    AST:[id number]  DOA: 07/08/2019     Date of Service: the patient was seen and examined on 08/04/2019  Brief hospital course: Patient with past medical history of dementia, seizure disorder and hypertension as well as CAD presents with failure to thrive and behavioral disturbances regarding her dementia. Patient was a prolonged hospital stay due to inability to find placement appropriate for the patient. Psychiatry has cleared the patient for placement. Currently patient is suffering from poor p.o. intake and resultant hypernatremia. Currently plan is treat sodium.  Assessment and Plan: 1.  Advanced Alzheimer's dementia. Behavioral disturbances. Depression with anxiety. Agitation. Seen by psychiatry. Chronically on Depakote hydroxyzine and Seroquel. Patient was on benzodiazepine which have been discontinued. Initial plan was to go to geriatric psychiatric facility. Psychiatry currently has cleared the patient to go to SNF. Now the patient has improved significantly but she does not have any skilled nursing requirement. Currently stable to go to ALF level of care. Agitation has significantly improved. Decrease Seroquel now that agitation is improved. Discontinue melatonin.  Continue hydroxyzine.  2.  Hypernatremia. Hypokalemia. Poor p.o. intake Patient has poor p.o. intake for last few days. Husband reports that the patient has lost significant amount of weight as well. Sodium is significantly elevated. Patient currently difficult to work with for medical treatment. At present we will encourage p.o. fluids and monitor sodium. If the sodium does not improve by tomorrow we may have to restrain the patient to get an IV line and give her IV fluids. Suspect that this is combination of progression of her dementia as well as excessive sleepiness may be from her medication. Medications were also adjusted.  3.  Seizure  disorder Continue Keppra. Continue Depakote.  4.  Goals of care conversation Had an extensive discussion with husband at bedside. Patient initially was on medication to help with her agitation. Later on likely resulted in excessive sleepiness. Now has poor p.o. intake and hypernatremia. We are reducing the medication gradually and increasing p.o. fluid although suspect patient will not cooperate with p.o. fluid. If the hyponatremia does not improve patient will require IV hydration. Which will cause to more agitation as patient will not cooperate with the treatment and may require more medication. Discussed with husband that at present concern is quality of life and control of agitation without restraint in a long-term ongoing basis. Not sure patient will do well in the long-term basis given difficulty with medication adjustment. We will continue to monitor sodium for now and continue to engage with husband regarding goals of care.  5.  UTI Treated with antibiotic. Cultures without growth.  6.  History of CAD. Essential hypertension. Currently stable.  Continue beta-blocker.  Continue aspirin.  7.  Weight loss. Husband reports weight loss. Monitor.  Body mass index is 24.33 kg/m.    Interventions:        Diet: Regular diet DVT Prophylaxis:   enoxaparin (LOVENOX) injection 40 mg Start: 07/08/19 2200    Advance goals of care discussion: DNR  Family Communication: family was present at bedside, at the time of interview.  The pt provided permission to discuss medical plan with the family. Opportunity was given to ask question and all questions were answered satisfactorily.   Disposition:  Status is: Inpatient  Remains inpatient appropriate because:Unsafe d/c plan and IV treatments appropriate due to intensity of illness or inability to take PO   Dispo:  Patient From: Home  Planned Disposition: ALF  Expected discharge date: 08/07/2019.  Medically stable for discharge:  Not medically ready         Subjective: Remains confused fatigue and tired.  No nausea no vomiting.  No fever no chills.  Physical Exam:  General: Appear in mild distress, no Rash; Oral Mucosa Clear, moist. no Abnormal Neck Mass Or lumps, Conjunctiva normal  Cardiovascular: S1 and S2 Present, no Murmur, Respiratory: good respiratory effort, Bilateral Air entry present and Clear to Auscultation, no Crackles, no wheezes Abdomen: Bowel Sound present, Soft and no tenderness Extremities: no Pedal edema, no calf tenderness Neurology: alert and not oriented to time, place, and person affect emotionally labile. no new focal deficit Gait not checked due to patient safety concerns  Vitals:   08/04/19 0817 08/04/19 1339 08/04/19 1726 08/04/19 1815  BP: (!) 136/105 (!) 127/46 128/73   Pulse: 60 67 (!) 59 71  Resp:  14    Temp:  98 F (36.7 C)    TempSrc:  Oral    SpO2:  100%  97%  Weight:      Height:        Intake/Output Summary (Last 24 hours) at 08/04/2019 1909 Last data filed at 08/04/2019 1700 Gross per 24 hour  Intake 575 ml  Output --  Net 575 ml   Filed Weights   07/21/19 0400  Weight: 58.4 kg    Data Reviewed: I have personally reviewed and interpreted daily labs, tele strips, imagings as discussed above. I reviewed all nursing notes, pharmacy notes, vitals, pertinent old records I have discussed plan of care as described above with RN and patient/family.  CBC: Recent Labs  Lab 08/02/19 1106  WBC 4.0  NEUTROABS 1.1*  HGB 14.8  HCT 46.4*  MCV 92.8  PLT 478   Basic Metabolic Panel: Recent Labs  Lab 08/02/19 1106 08/04/19 0425 08/04/19 1109 08/04/19 1648  NA 147* 151* 149* 148*  K 4.1 4.5 4.7 4.4  CL 109 111 111 110  CO2 25 26 28 26   GLUCOSE 107* 110* 113* 103*  BUN 39* 40* 41* 41*  CREATININE 0.89 0.89 0.94 0.76  CALCIUM 10.0 10.5* 10.5* 9.8  MG 2.5*  --   --   --     Studies: No results found.  Scheduled Meds: . aspirin  81 mg Oral Daily  .  carvedilol  3.125 mg Oral BID WC  . cholecalciferol  5,000 Units Oral Daily  . divalproex  250 mg Oral Q12H  . enoxaparin (LOVENOX) injection  40 mg Subcutaneous Q24H  . feeding supplement (ENSURE ENLIVE)  237 mL Oral BID BM  . hydrOXYzine  25 mg Oral QHS  . levETIRAcetam  1,500 mg Oral BID  . multivitamin with minerals  1 tablet Oral Daily  . [START ON 08/05/2019] QUEtiapine  25 mg Oral q morning - 10a  . QUEtiapine  50 mg Oral QHS   Continuous Infusions: PRN Meds: acetaminophen **OR** acetaminophen, ibuprofen, ondansetron **OR** ondansetron (ZOFRAN) IV  Time spent: 35 minutes  Author: Berle Mull, MD [facility] 08/04/2019 7:09 PM  To reach On-call, see care teams to locate the attending and reach out via [email or web address]. Between 7PM-7AM, please contact night-coverage If you still have difficulty reaching the attending provider, please page the [facility] (Director on Call) for [facility] on amion for assistance.

## 2019-08-05 LAB — BASIC METABOLIC PANEL
Anion gap: 12 (ref 5–15)
Anion gap: 13 (ref 5–15)
BUN: 38 mg/dL — ABNORMAL HIGH (ref 8–23)
BUN: 40 mg/dL — ABNORMAL HIGH (ref 8–23)
CO2: 25 mmol/L (ref 22–32)
CO2: 26 mmol/L (ref 22–32)
Calcium: 9.8 mg/dL (ref 8.9–10.3)
Calcium: 10.7 mg/dL — ABNORMAL HIGH (ref 8.9–10.3)
Chloride: 111 mmol/L (ref 98–111)
Chloride: 113 mmol/L — ABNORMAL HIGH (ref 98–111)
Creatinine, Ser: 0.77 mg/dL (ref 0.44–1.00)
Creatinine, Ser: 0.88 mg/dL (ref 0.44–1.00)
GFR calc Af Amer: >60 mL/min (ref >60)
GFR calc Af Amer: >60 mL/min (ref >60)
GFR calc non Af Amer: >60 mL/min (ref >60)
GFR calc non Af Amer: >60 mL/min (ref >60)
Glucose, Bld: 103 mg/dL — ABNORMAL HIGH (ref 70–99)
Glucose, Bld: 111 mg/dL — ABNORMAL HIGH (ref 70–99)
Potassium: 3.8 mmol/L (ref 3.5–5.1)
Potassium: 4.2 mmol/L (ref 3.5–5.1)
Sodium: 148 mmol/L — ABNORMAL HIGH (ref 135–145)
Sodium: 152 mmol/L — ABNORMAL HIGH (ref 135–145)

## 2019-08-05 MED ORDER — QUETIAPINE FUMARATE 25 MG PO TABS
25.0000 mg | ORAL_TABLET | Freq: Every day | ORAL | Status: DC | PRN
  Administered 2019-08-09 – 2019-08-28 (×9): 25 mg via ORAL
  Filled 2019-08-05 (×9): qty 1

## 2019-08-05 NOTE — Progress Notes (Addendum)
[facility] Progress Note  Patient: Lisa Mclaughlin    EHO:[id number]  DOA: 07/08/2019     Date of Service: the patient was seen and examined on 08/05/2019  Brief hospital course: Patient with past medical history of dementia, seizure disorder and hypertension as well as CAD presents with failure to thrive and behavioral disturbances regarding her dementia. Patient has a prolonged hospital stay due to inability to find placement appropriate for the patient. Psychiatry has cleared the patient for placement. Currently patient is suffering from poor p.o. intake and resultant hypernatremia. Currently plan is continue to engage in goals of care conversation and continue current medication.  Assessment and Plan: 1.  Advanced Alzheimer's dementia. Behavioral disturbances. Depression with anxiety. Agitation.  Currently resolved as of on 08/05/2019. Seen by psychiatry. Chronically on Depakote, hydroxyzine and Seroquel. Patient was on benzodiazepine which have been discontinued. Initial plan was to go to geriatric psychiatric facility. Psychiatry currently has cleared the patient to go to SNF. Now the patient has improved significantly  She does not have any skilled nursing requirement. Currently stable to go to ALF level of care. Agitation has significantly improved. Decrease Seroquel now that agitation is improved. Discontinue melatonin.  Continue hydroxyzine.  2.  Hypernatremia. Hypokalemia. Poor p.o. intake Patient has poor p.o. intake Husband reports that the patient has lost significant amount of weight as well. Sodium is elevated. Patient currently difficult to work with for medical treatment due to her dementia. At present we will encourage p.o. fluids and monitor sodium. Suspect that this is combination of progression of her dementia as well as excessive sleepiness may be from her medication. Medications were also adjusted.  3.  Seizure disorder Continue Keppra. Continue  Depakote.  4.  Goals of care conversation Had an extensive discussion with husband at bedside on 08/05/2019 as well as 08/04/2019. Patient initially was on medication to help with her agitation. Later on likely resulted in excessive sleepiness. Now has poor p.o. intake and hypernatremia. We are reducing the medication gradually and increasing p.o. fluid.  Patient is taking oral medication and oral hydration but patient will not cooperate with IV fluid. Discussed with husband that at present concern is quality of life and control of agitation without restraint in a long-term ongoing basis. Not sure patient will do well in the long-term basis given difficulty with medication adjustment and remains at risk for recurrent admission, infection, pneumonia, bedsores, fall trauma injury from being on medications that are psychotropic. Husband would like to engage with hospice agencies for outpatient palliative care.  Husband currently thinking about transitioning to hospice as well but has not made any decision yet.  5.  UTI Treated with antibiotic. Cultures without growth.  6.  History of CAD. Essential hypertension. Currently stable.  Continue beta-blocker.  Continue aspirin.  7.  Weight loss. Husband reports weight loss. Monitor. Continue supplements  Body mass index is 24.33 kg/m.    Interventions:        Diet: Regular diet, aspiration precaution DVT Prophylaxis:   enoxaparin (LOVENOX) injection 40 mg Start: 07/08/19 2200    Advance goals of care discussion: DNR  Family Communication: family was present at bedside, at the time of interview.  The pt provided permission to discuss medical plan with the family. Opportunity was given to ask question and all questions were answered satisfactorily.   Disposition:  Status is: Inpatient  Remains inpatient appropriate because:Unsafe d/c plan and IV treatments appropriate due to intensity of illness or inability to take  PO  Dispo:  Patient From: Home  Planned Disposition: ALF  Expected discharge date: 08/07/2019.  Medically stable for discharge: medically ready     Subjective: Confusion remains.  No nausea no vomiting.  No fever no chills.  No acute events overnight.  Physical Exam:  General: Appear in mild distress, no Rash; Oral Mucosa Clear, moist. no Abnormal Neck Mass Or lumps, Conjunctiva normal  Cardiovascular: S1 and S2 Present, no Murmur, Respiratory: good respiratory effort, Bilateral Air entry present and Clear to Auscultation, no Crackles, no wheezes Abdomen: Bowel Sound present, Soft and no tenderness Extremities: no Pedal edema, no calf tenderness Neurology: alert and not oriented to time, place, and person affect emotionally labile. no new focal deficit Gait not checked due to patient safety concerns  Vitals:   08/04/19 1938 08/05/19 0500 08/05/19 0907 08/05/19 1338  BP: 116/74 134/73 111/86 (!) 147/84  Pulse: 68 74 73 79  Resp: 19 17 16 16   Temp: 97.8 F (36.6 C) 98 F (36.7 C) (!) 97.5 F (36.4 C) 97.9 F (36.6 C)  TempSrc:   Oral Oral  SpO2: 100% 95% 100% (!) 86%  Weight:      Height:        Intake/Output Summary (Last 24 hours) at 08/05/2019 1616 Last data filed at 08/05/2019 0930 Gross per 24 hour  Intake 315 ml  Output --  Net 315 ml   Filed Weights   07/21/19 0400  Weight: 58.4 kg    Data Reviewed: I have personally reviewed and interpreted daily labs, tele strips, imagings as discussed above. I reviewed all nursing notes, pharmacy notes, vitals, pertinent old records I have discussed plan of care as described above with RN and patient/family.  CBC: Recent Labs  Lab 08/02/19 1106  WBC 4.0  NEUTROABS 1.1*  HGB 14.8  HCT 46.4*  MCV 92.8  PLT 637   Basic Metabolic Panel: Recent Labs  Lab 08/02/19 1106 08/04/19 0425 08/04/19 1109 08/04/19 1648 08/05/19 0340  NA 147* 151* 149* 148* 148*  K 4.1 4.5 4.7 4.4 3.8  CL 109 111 111 110 111  CO2 25  26 28 26 25   GLUCOSE 107* 110* 113* 103* 103*  BUN 39* 40* 41* 41* 38*  CREATININE 0.89 0.89 0.94 0.76 0.77  CALCIUM 10.0 10.5* 10.5* 9.8 9.8  MG 2.5*  --   --   --   --     Studies: No results found.  Scheduled Meds: . aspirin  81 mg Oral Daily  . carvedilol  3.125 mg Oral BID WC  . cholecalciferol  5,000 Units Oral Daily  . divalproex  250 mg Oral Q12H  . enoxaparin (LOVENOX) injection  40 mg Subcutaneous Q24H  . feeding supplement (ENSURE ENLIVE)  237 mL Oral BID BM  . hydrOXYzine  25 mg Oral QHS  . levETIRAcetam  1,500 mg Oral BID  . multivitamin with minerals  1 tablet Oral Daily  . QUEtiapine  50 mg Oral QHS   Continuous Infusions: PRN Meds: acetaminophen **OR** acetaminophen, ibuprofen, ondansetron **OR** ondansetron (ZOFRAN) IV, QUEtiapine  Time spent: 50 minutes.  Spent 35 minutes discussion with family regarding goals of care.   Author: Berle Mull, MD [facility] 08/05/2019 4:16 PM  To reach On-call, see care teams to locate the attending and reach out via [email or web address]. Between 7PM-7AM, please contact night-coverage If you still have difficulty reaching the attending provider, please page the [facility] (Director on Call) for [facility] on amion for assistance.

## 2019-08-05 NOTE — TOC Progression Note (Signed)
Transition of Care [facility]) - Progression Note    Patient Details  Name: Shanasia Ibrahim MRN: [id number] Date of Birth: 02-03-1950  Transition of Care [facility]) CM/SW [place], [place] Phone Number: 08/05/2019, 2:33 PM  Clinical Narrative:   Spoke with Lenna Sciara at [facility].  She rescheduled to come out on Thursday PM.  Have left a message for call back at [place], and [place], [place] and [place]. Also [facility].  Spoke to husband about referral to [facility], and made referral to [facility] for palliative care. TOC will continue to follow during the course of hospitalization.     Expected Discharge Plan: Skilled Nursing Facility Barriers to Discharge: Other (comment) (Husband and facility working out particulars of self pay)  Expected Discharge Plan and Services Expected Discharge Plan: [place] Choice: [place] arrangements for the past 2 months: [place] Expected Discharge Date: 07/29/19                                     Social Determinants of Health (SDOH) Interventions    Readmission Risk Interventions No flowsheet data found.

## 2019-08-05 NOTE — Progress Notes (Signed)
Hydrologist [facility])  Hospital Liaison: RN note         Notified by [facility] manager of patient/family request for [facility] Palliative services at home after discharge.         Writer left VM message for husband, Louie Casa.               [place] [facility]iative team will follow up with patient after discharge.         Please call with any hospice or palliative related questions.         Thank you for this referral.         Farrel Gordon, RN, CCM  [place] (listed on [place] under Hospice[facility])    [phone]

## 2019-08-06 LAB — BASIC METABOLIC PANEL
Anion gap: 11 (ref 5–15)
Anion gap: 15 (ref 5–15)
BUN: 43 mg/dL — ABNORMAL HIGH (ref 8–23)
BUN: 38 mg/dL — ABNORMAL HIGH (ref 8–23)
CO2: 26 mmol/L (ref 22–32)
CO2: 27 mmol/L (ref 22–32)
Calcium: 10.1 mg/dL (ref 8.9–10.3)
Calcium: 10.9 mg/dL — ABNORMAL HIGH (ref 8.9–10.3)
Chloride: 110 mmol/L (ref 98–111)
Chloride: 111 mmol/L (ref 98–111)
Creatinine, Ser: 1.05 mg/dL — ABNORMAL HIGH (ref 0.44–1.00)
Creatinine, Ser: 0.97 mg/dL (ref 0.44–1.00)
GFR calc Af Amer: >60 mL/min (ref >60)
GFR calc Af Amer: >60 mL/min (ref >60)
GFR calc non Af Amer: 54 mL/min — ABNORMAL LOW (ref >60)
GFR calc non Af Amer: 60 mL/min — ABNORMAL LOW (ref >60)
Glucose, Bld: 115 mg/dL — ABNORMAL HIGH (ref 70–99)
Glucose, Bld: 125 mg/dL — ABNORMAL HIGH (ref 70–99)
Potassium: 4.6 mmol/L (ref 3.5–5.1)
Potassium: 5 mmol/L (ref 3.5–5.1)
Sodium: 147 mmol/L — ABNORMAL HIGH (ref 135–145)
Sodium: 153 mmol/L — ABNORMAL HIGH (ref 135–145)

## 2019-08-06 MED ORDER — MEGESTROL ACETATE 40 MG PO TABS
40.0000 mg | ORAL_TABLET | Freq: Every day | ORAL | Status: DC
  Filled 2019-08-06: qty 1

## 2019-08-06 MED ORDER — MEGESTROL ACETATE 400 MG/10ML PO SUSP
400.0000 mg | Freq: Every day | ORAL | Status: DC
  Administered 2019-08-06 – 2019-08-27 (×22): 400 mg via ORAL
  Filled 2019-08-06 (×23): qty 10

## 2019-08-06 NOTE — Progress Notes (Signed)
PROGRESS NOTE    Lisa Mclaughlin  KDX:[id number] DOB: 1950/07/20 DOA: 07/08/2019 PCP: Patient, No Pcp Per   Brief Narrative:  69 yo Patient with past medical history of dementia, seizure disorder and hypertension as well as CAD presents with failure to thrive and behavioral disturbances regarding her dementia. Patient has a prolonged hospital stay due to inability to find placement appropriate for the patient.Psychiatry has cleared the patient for placement. Currently patient is suffering from poor p.o. intake and resultant hypernatremia. Currently plan is continue to engage in goals of care conversation and continue current medication.   Assessment & Plan:   Principal Problem:   Agitation Active Problems:   Dementia in Alzheimer's disease ([place])   Seizures ([place])   Agitation due to dementia [facility])  Advanced Alzheimer's dementia. Behavioral disturbances. Depression with anxiety. Agitation.  Currently resolved as of on 08/05/2019. Patient has been seen and cleared by psychiatry.  Continue chronic Depakote, hydroxyzine and Seroquel.  Initially was supposed to be going to psych facility but now going to ALF as patient refused SNF. Seroquel dose reduced Melatonin discontinued   Hypernatremia.  Secondary to intravascular volume depletion Hypokalemia. Poor p.o. intake Encourage oral intake.  Progressive dementia.  Patient not cooperating with IV fluids.  Seizure disorder Keppra and Depakote  Goals of care conversation Initially on medication for agitation which caused excessive drowsiness.  Gradually doses have been reduced.  Encouraging oral hydration. Previously discussed quality of life, and controlling agitation.  Husband is having discussion with hospice about transitioning the patient.  Urinary tract infection without hematuria Completed antibiotic treatment.  Cultures remain negative  History of CAD. Essential hypertension. Currently stable and chest pain-free.  Continue  beta-blocker.  Continue aspirin.  Weight loss, protein calorie malnutrition, moderate Encourage oral intake, continue supplements  Body mass index is 24.33 kg/m.  Interventions:  Extensively discussed her goals of care with the patient's husband at bedside, at this time unit there is concerns of aspiration-would use basic aspiration precautions otherwise no aggressive care.  We will try and give regular diet/chopped.     Diet: Regular diet, aspiration precaution DVT Prophylaxis:   enoxaparin (LOVENOX) injection 40 mg Start: 07/08/19 2200    Advance goals of care discussion: DNR  Family Communication:  Husband at bedside  Disposition:  Status is: Inpatient  Remains inpatient appropriate because:Unsafe d/c plan and IV treatments appropriate due to intensity of illness or inability to take PO   Dispo:             Patient From: Home             Planned Disposition: ALF             Expected discharge date: 08/07/2019.             Medically stable for discharge: medically ready     Body mass index is 24.33 kg/m.     Subjective: Patient seen and examined at bedside, does not communicate effectively.  She is awake.  Met with husband at bedside.  Review of Systems Otherwise negative except as per HPI, including: Difficult to obtain given her mentation  Examination:  General exam: Not in acute distress, elderly frail chronically ill-appearing with bilateral temporal wasting Respiratory system: Clear to auscultation. Respiratory effort normal. Cardiovascular system: S1 & S2 heard, RRR. No JVD, murmurs, rubs, gallops or clicks. No pedal edema. Gastrointestinal system: Abdomen is nondistended, soft and nontender. No organomegaly or masses felt. Normal bowel sounds heard. Central nervous system: No focal neuro  deficits Extremities: Symmetric 5 x 5 power. Skin: No rashes, lesions or ulcers Psychiatry: Poor judgment and insight, awake but not  alert  Objective: Vitals:   08/05/19 1338 08/05/19 1811 08/05/19 1948 08/06/19 0425  BP: (!) 147/84 131/87 (!) 121/94 (!) 142/62  Pulse: 79 76 72 64  Resp: '16 16 17 17  '$ Temp: 97.9 F (36.6 C) 97.6 F (36.4 C) 97.7 F (36.5 C) 97.6 F (36.4 C)  TempSrc: Oral Oral Axillary Oral  SpO2: (!) 86% 100% 100% 99%  Weight:      Height:        Intake/Output Summary (Last 24 hours) at 08/06/2019 0721 Last data filed at 08/05/2019 1745 Gross per 24 hour  Intake 360 ml  Output --  Net 360 ml   Filed Weights   07/21/19 0400  Weight: 58.4 kg     Data Reviewed:   CBC: Recent Labs  Lab 08/02/19 1106  WBC 4.0  NEUTROABS 1.1*  HGB 14.8  HCT 46.4*  MCV 92.8  PLT 964   Basic Metabolic Panel: Recent Labs  Lab 08/02/19 1106 08/02/19 1106 08/04/19 0425 08/04/19 1109 08/04/19 1648 08/05/19 0340 08/05/19 1655  NA 147*   < > 151* 149* 148* 148* 152*  K 4.1   < > 4.5 4.7 4.4 3.8 4.2  CL 109   < > 111 111 110 111 113*  CO2 25   < > '26 28 26 25 26  '$ GLUCOSE 107*   < > 110* 113* 103* 103* 111*  BUN 39*   < > 40* 41* 41* 38* 40*  CREATININE 0.89   < > 0.89 0.94 0.76 0.77 0.88  CALCIUM 10.0   < > 10.5* 10.5* 9.8 9.8 10.7*  MG 2.5*  --   --   --   --   --   --    < > = values in this interval not displayed.   GFR: Estimated Creatinine Clearance: 49.5 mL/min (by C-G formula based on SCr of 0.88 mg/dL). Liver Function Tests: Recent Labs  Lab 08/02/19 1106  AST 44*  ALT 107*  ALKPHOS 114  BILITOT 0.7  PROT 8.4*  ALBUMIN 4.3   No results for input(s): LIPASE, AMYLASE in the last 168 hours. No results for input(s): AMMONIA in the last 168 hours. Coagulation Profile: No results for input(s): INR, PROTIME in the last 168 hours. Cardiac Enzymes: No results for input(s): CKTOTAL, CKMB, CKMBINDEX, TROPONINI in the last 168 hours. BNP (last 3 results) No results for input(s): PROBNP in the last 8760 hours. HbA1C: No results for input(s): HGBA1C in the last 72 hours. CBG: No  results for input(s): GLUCAP in the last 168 hours. Lipid Profile: No results for input(s): CHOL, HDL, LDLCALC, TRIG, CHOLHDL, LDLDIRECT in the last 72 hours. Thyroid Function Tests: No results for input(s): TSH, T4TOTAL, FREET4, T3FREE, THYROIDAB in the last 72 hours. Anemia Panel: No results for input(s): VITAMINB12, FOLATE, FERRITIN, TIBC, IRON, RETICCTPCT in the last 72 hours. Sepsis Labs: No results for input(s): PROCALCITON, LATICACIDVEN in the last 168 hours.  Recent Results (from the past 240 hour(s))  SARS Coronavirus 2 by RT PCR (hospital order, performed in [facility] hospital lab) Nasopharyngeal Nasopharyngeal Swab     Status: None   Collection Time: 07/29/19  7:25 AM   Specimen: Nasopharyngeal Swab  Result Value Ref Range Status   SARS Coronavirus 2 NEGATIVE NEGATIVE Final    Comment: (NOTE) SARS-CoV-2 target nucleic acids are NOT DETECTED.  The SARS-CoV-2 RNA is  generally detectable in upper and lower respiratory specimens during the acute phase of infection. The lowest concentration of SARS-CoV-2 viral copies this assay can detect is 250 copies / mL. A negative result does not preclude SARS-CoV-2 infection and should not be used as the sole basis for treatment or other patient management decisions.  A negative result may occur with improper specimen collection / handling, submission of specimen other than nasopharyngeal swab, presence of viral mutation(s) within the areas targeted by this assay, and inadequate number of viral copies (<250 copies / mL). A negative result must be combined with clinical observations, patient history, and epidemiological information.  Fact Sheet for Patients:   StrictlyIdeas.no  Fact Sheet for Healthcare Providers: BankingDealers.co.za  This test is not yet approved or  cleared by the [place] FDA and has been authorized for detection and/or diagnosis of SARS-CoV-2 by FDA under an  Emergency Use Authorization (EUA).  This EUA will remain in effect (meaning this test can be used) for the duration of the COVID-19 declaration under Section 564(b)(1) of the Act, 21 U.S.C. section 360bbb-3(b)(1), unless the authorization is terminated or revoked sooner.  Performed at [facility], [place] [address] [place], [place] [address]          Radiology Studies: No results found.      Scheduled Meds: . aspirin  81 mg Oral Daily  . carvedilol  3.125 mg Oral BID WC  . cholecalciferol  5,000 Units Oral Daily  . divalproex  250 mg Oral Q12H  . enoxaparin (LOVENOX) injection  40 mg Subcutaneous Q24H  . feeding supplement (ENSURE ENLIVE)  237 mL Oral BID BM  . hydrOXYzine  25 mg Oral QHS  . levETIRAcetam  1,500 mg Oral BID  . multivitamin with minerals  1 tablet Oral Daily  . QUEtiapine  50 mg Oral QHS   Continuous Infusions:   LOS: 27 days   Time spent= 20 mins    Chester Romero Arsenio Loader, MD [facility]  If 7PM-7AM, please contact night-coverage  08/06/2019, 7:21 AM

## 2019-08-06 NOTE — TOC Progression Note (Signed)
Transition of Care [facility]) - Progression Note    Patient Details  Name: Lisa Mclaughlin MRN: [id number] Date of Birth: 19-Jan-1950  Transition of Care [facility] CM/SW [place], [place] Phone Number: 08/06/2019, 1:49 PM  Clinical Narrative:  Patient and Husband visited by Lovena Le, RN,  from [place].  She will run patient for clinical presentation and get back with me re:  Status of bed offer.  Melissa from [facility] is supposed to visit tomorrow.  Also got interest call from [place] at [facility]. TOC will continue to follow during the course of hospitalization.      Expected Discharge Plan: Skilled Nursing Facility Barriers to Discharge: Other (comment) (Husband and facility working out particulars of self pay)  Expected Discharge Plan and Services Expected Discharge Plan: [place] Choice: [place] arrangements for the past 2 months: [place] Expected Discharge Date: 07/29/19                                     Social Determinants of Health (SDOH) Interventions    Readmission Risk Interventions No flowsheet data found.

## 2019-08-07 DIAGNOSIS — R451 Restlessness and agitation: Secondary | ICD-10-CM | POA: Diagnosis not present

## 2019-08-07 NOTE — Progress Notes (Signed)
PROGRESS NOTE    Lisa Mclaughlin  VOZ:[id number] DOB: 04-07-1950 DOA: 07/08/2019 PCP: Patient, No Pcp Per   Brief Narrative:  69 yo Patient with past medical history of dementia, seizure disorder and hypertension as well as CAD presents with failure to thrive and behavioral disturbances regarding her dementia. Patient has a prolonged hospital stay due to inability to find placement appropriate for the patient.Psychiatry has cleared the patient for placement. Currently patient is suffering from poor p.o. intake and resultant hypernatremia. Currently plan is continue to engage in goals of care conversation and continue current medication.   Assessment & Plan:   Principal Problem:   Agitation Active Problems:   Dementia in Alzheimer's disease ([place])   Seizures ([place])   Agitation due to dementia [facility])  Advanced Alzheimer's dementia. Behavioral disturbances. Depression with anxiety. Agitation.  Currently resolved as of on 08/05/2019. Patient has been seen and cleared by psychiatry.  Continue chronic Depakote, hydroxyzine and Seroquel.  Initially was supposed to be going to psych facility but now going to ALF as patient refused SNF. Seroquel can be further reduced if necessary Melatonin discontinued   Hypernatremia.  Secondary to intravascular volume depletion Hypokalemia. Poor p.o. intake Patient is not allowing any sort of IV fluids or IV Therefore encourage oral intake.  Seizure disorder Keppra and Depakote  Goals of care conversation Initially on medication for agitation which caused excessive drowsiness.  Gradually doses have been reduced.  Encouraging oral hydration. Previously discussed quality of life, and controlling agitation.  Husband is having discussion with hospice about transitioning the patient.  Urinary tract infection without hematuria Completed antibiotic treatment.  Cultures remain negative  History of CAD. Essential hypertension. Currently stable and chest  pain-free.  Continue beta-blocker.  Continue aspirin.  Weight loss, protein calorie malnutrition, moderate Encourage oral intake, continue supplements  Body mass index is 24.33 kg/m.  Interventions:  Eating with assistance     Diet: Regular diet, aspiration precaution DVT Prophylaxis:   enoxaparin (LOVENOX) injection 40 mg Start: 07/08/19 2200    Advance goals of care discussion: DNR  Family Communication:  None at bedside today  Disposition:  Status is: Inpatient  Remains inpatient appropriate because:Unsafe d/c plan and IV treatments appropriate due to intensity of illness or inability to take PO   Dispo:             Patient From: Home             Planned Disposition: ALF             Expected discharge date: 08/07/2019.             Medically stable for discharge: medically ready.  Awaiting placement     Body mass index is 24.33 kg/m.     Subjective: Patient is laying in the bed does not have any complaints.  Does not communicate effectively but is able to track me across the room and follow very basic commands.  Review of Systems Otherwise negative except as per HPI, including: Difficult to obtain given her mentation  Examination:  Constitutional: Chronically ill-appearing but not in any acute distress Respiratory: Clear to auscultation bilaterally Cardiovascular: Normal sinus rhythm, no rubs Abdomen: Nontender nondistended good bowel sounds Musculoskeletal: No edema noted Skin: No rashes seen Neurologic: Grossly moving all the extremities but otherwise difficult to assess Psychiatric: Poor judgment and insight  Objective: Vitals:   08/06/19 1333 08/06/19 1957 08/07/19 0401 08/07/19 0839  BP: 132/67 (!) 138/95 135/76 133/68  Pulse: 72 81 (!) 108  72  Resp: 16 18 18 20   Temp: 97.7 F (36.5 C) 98.1 F (36.7 C) 98.1 F (36.7 C) 98.1 F (36.7 C)  TempSrc: Oral   Oral  SpO2: 100% 100% 96% 100%  Weight:      Height:        Intake/Output  Summary (Last 24 hours) at 08/07/2019 1108 Last data filed at 08/07/2019 1101 Gross per 24 hour  Intake 360 ml  Output 100 ml  Net 260 ml   Filed Weights   07/21/19 0400  Weight: 58.4 kg     Data Reviewed:   CBC: Recent Labs  Lab 08/02/19 1106  WBC 4.0  NEUTROABS 1.1*  HGB 14.8  HCT 46.4*  MCV 92.8  PLT 086   Basic Metabolic Panel: Recent Labs  Lab 08/02/19 1106 08/04/19 0425 08/04/19 1648 08/05/19 0340 08/05/19 1655 08/06/19 0703 08/06/19 1710  NA 147*   < > 148* 148* 152* 147* 153*  K 4.1   < > 4.4 3.8 4.2 4.6 5.0  CL 109   < > 110 111 113* 110 111  CO2 25   < > 26 25 26 26 27   GLUCOSE 107*   < > 103* 103* 111* 115* 125*  BUN 39*   < > 41* 38* 40* 43* 38*  CREATININE 0.89   < > 0.76 0.77 0.88 1.05* 0.97  CALCIUM 10.0   < > 9.8 9.8 10.7* 10.1 10.9*  MG 2.5*  --   --   --   --   --   --    < > = values in this interval not displayed.   GFR: Estimated Creatinine Clearance: 44.9 mL/min (by C-G formula based on SCr of 0.97 mg/dL). Liver Function Tests: Recent Labs  Lab 08/02/19 1106  AST 44*  ALT 107*  ALKPHOS 114  BILITOT 0.7  PROT 8.4*  ALBUMIN 4.3   No results for input(s): LIPASE, AMYLASE in the last 168 hours. No results for input(s): AMMONIA in the last 168 hours. Coagulation Profile: No results for input(s): INR, PROTIME in the last 168 hours. Cardiac Enzymes: No results for input(s): CKTOTAL, CKMB, CKMBINDEX, TROPONINI in the last 168 hours. BNP (last 3 results) No results for input(s): PROBNP in the last 8760 hours. HbA1C: No results for input(s): HGBA1C in the last 72 hours. CBG: No results for input(s): GLUCAP in the last 168 hours. Lipid Profile: No results for input(s): CHOL, HDL, LDLCALC, TRIG, CHOLHDL, LDLDIRECT in the last 72 hours. Thyroid Function Tests: No results for input(s): TSH, T4TOTAL, FREET4, T3FREE, THYROIDAB in the last 72 hours. Anemia Panel: No results for input(s): VITAMINB12, FOLATE, FERRITIN, TIBC, IRON, RETICCTPCT  in the last 72 hours. Sepsis Labs: No results for input(s): PROCALCITON, LATICACIDVEN in the last 168 hours.  Recent Results (from the past 240 hour(s))  SARS Coronavirus 2 by RT PCR (hospital order, performed in [facility] hospital lab) Nasopharyngeal Nasopharyngeal Swab     Status: None   Collection Time: 07/29/19  7:25 AM   Specimen: Nasopharyngeal Swab  Result Value Ref Range Status   SARS Coronavirus 2 NEGATIVE NEGATIVE Final    Comment: (NOTE) SARS-CoV-2 target nucleic acids are NOT DETECTED.  The SARS-CoV-2 RNA is generally detectable in upper and lower respiratory specimens during the acute phase of infection. The lowest concentration of SARS-CoV-2 viral copies this assay can detect is 250 copies / mL. A negative result does not preclude SARS-CoV-2 infection and should not be used as the sole basis for treatment  or other patient management decisions.  A negative result may occur with improper specimen collection / handling, submission of specimen other than nasopharyngeal swab, presence of viral mutation(s) within the areas targeted by this assay, and inadequate number of viral copies (<250 copies / mL). A negative result must be combined with clinical observations, patient history, and epidemiological information.  Fact Sheet for Patients:   StrictlyIdeas.no  Fact Sheet for Healthcare Providers: BankingDealers.co.za  This test is not yet approved or  cleared by the [place] FDA and has been authorized for detection and/or diagnosis of SARS-CoV-2 by FDA under an Emergency Use Authorization (EUA).  This EUA will remain in effect (meaning this test can be used) for the duration of the COVID-19 declaration under Section 564(b)(1) of the Act, 21 U.S.C. section 360bbb-3(b)(1), unless the authorization is terminated or revoked sooner.  Performed at [facility], [place] [address] [place],   [address]          Radiology Studies: No results found.      Scheduled Meds: . aspirin  81 mg Oral Daily  . carvedilol  3.125 mg Oral BID WC  . cholecalciferol  5,000 Units Oral Daily  . divalproex  250 mg Oral Q12H  . enoxaparin (LOVENOX) injection  40 mg Subcutaneous Q24H  . feeding supplement (ENSURE ENLIVE)  237 mL Oral BID BM  . hydrOXYzine  25 mg Oral QHS  . levETIRAcetam  1,500 mg Oral BID  . megestrol  400 mg Oral Daily  . multivitamin with minerals  1 tablet Oral Daily  . QUEtiapine  50 mg Oral QHS   Continuous Infusions:   LOS: 28 days   Time spent= 20 mins    Gregorey Nabor Arsenio Loader, MD [facility]  If 7PM-7AM, please contact night-coverage  08/07/2019, 11:08 AM

## 2019-08-08 DIAGNOSIS — R451 Restlessness and agitation: Secondary | ICD-10-CM | POA: Diagnosis not present

## 2019-08-08 NOTE — TOC Progression Note (Signed)
Transition of Care [facility]) - Progression Note    Patient Details  Name: Lisa Mclaughlin MRN: [id number] Date of Birth: 1950/02/01  Transition of Care [facility]) CM/SW [place], [employer] Phone Number: 08/08/2019, 11:25 AM  Clinical Narrative:   Spoke with Lovena Le, RN at [facility], [phone], who states they have still not gotten notes to review from [facility].  Asked if I could request someone from that facility call her directly, and she agreed.  Called husband to report this, who stated he would call director and ask her to call. TOC will continue to follow during the course of hospitalization.     Expected Discharge Plan: Skilled Nursing Facility Barriers to Discharge: Other (comment) (Husband and facility working out particulars of self pay)  Expected Discharge Plan and Services Expected Discharge Plan: [place] Choice: [place] arrangements for the past 2 months: [place] Expected Discharge Date: 07/29/19                                     Social Determinants of Health (SDOH) Interventions    Readmission Risk Interventions No flowsheet data found.

## 2019-08-08 NOTE — Progress Notes (Signed)
PROGRESS NOTE    Lisa Mclaughlin  OZH:[id number] DOB: 16-Jan-1950 DOA: 07/08/2019 PCP: Patient, No Pcp Per   Brief Narrative:  69 yo Patient with past medical history of dementia, seizure disorder and hypertension as well as CAD presents with failure to thrive and behavioral disturbances regarding her dementia. Patient has a prolonged hospital stay due to inability to find placement appropriate for the patient.Psychiatry has cleared the patient for placement. Currently patient is suffering from poor p.o. intake and resultant hypernatremia. Currently plan is continue to engage in goals of care conversation and continue current medication.   Assessment & Plan:   Principal Problem:   Agitation Active Problems:   Dementia in Alzheimer's disease ([place])   Seizures ([place])   Agitation due to dementia [facility])  Advanced Alzheimer's dementia. Behavioral disturbances. Depression with anxiety. Agitation.  Currently resolved as of on 08/05/2019. Patient has been seen and cleared by psychiatry.  Continue chronic Depakote, hydroxyzine and Seroquel.  Initially was supposed to be going to psych facility but now going to ALF as patient refused SNF. Current dose of Seroquel working well, reduce if necessary Melatonin has been stopped   Hypernatremia.  Secondary to intravascular volume depletion Hypokalemia. Poor p.o. intake Patient is not allowing any sort of IV fluids or IV Therefore encourage oral intake.  Seizure disorder Keppra and Depakote  Goals of care conversation Initially on medication for agitation which caused excessive drowsiness.  Gradually doses have been reduced.  Encouraging oral hydration. Previously discussed quality of life, and controlling agitation.  Husband is having discussion with hospice about transitioning the patient.  Urinary tract infection without hematuria Completed antibiotic treatment.  Cultures remain negative  History of CAD. Essential hypertension. Currently stable  and chest pain-free.  Continue beta-blocker.  Continue aspirin.  Weight loss, protein calorie malnutrition, moderate Encourage oral intake, continue supplements  Body mass index is 24.33 kg/m.  Interventions:  Eating with assistance     Diet: Regular diet, aspiration precaution DVT Prophylaxis:   enoxaparin (LOVENOX) injection 40 mg Start: 07/08/19 2200    Advance goals of care discussion: DNR  Family Communication:  Spoke with son yesterday afternoon.  Disposition:  Status is: Inpatient  Remains inpatient appropriate because:Unsafe d/c plan and IV treatments appropriate due to intensity of illness or inability to take PO   Dispo:             Patient From: Home             Planned Disposition: ALF             Expected discharge date: 08/07/2019.             Medically stable for discharge: medically ready.  Awaiting placement     Body mass index is 24.33 kg/m.     Subjective: Sitting up in bed attempting to eat her breakfast, no complaints.  Review of Systems Otherwise negative except as per HPI, including: Difficult to obtain given her mentation  Examination: Constitutional: Elderly frail Respiratory: Clear to auscultation bilaterally Cardiovascular: Normal sinus rhythm, no rubs Abdomen: Nontender nondistended good bowel sounds Musculoskeletal: No edema noted Skin: No rashes seen Neurologic: Exam is nonfocal but overall difficult to assess Psychiatric: Alert to name but poor judgment and insight.  Around her baseline  Objective: Vitals:   08/07/19 1600 08/07/19 1947 08/08/19 0548 08/08/19 0800  BP: 126/72 (!) 145/81 131/72 136/69  Pulse: 68 71 63 74  Resp: 20 17 17 18   Temp:  98.1 F (36.7 C) 98.5 F (  36.9 C)   TempSrc:      SpO2:  100% 100%   Weight:      Height:        Intake/Output Summary (Last 24 hours) at 08/08/2019 0942 Last data filed at 08/07/2019 1327 Gross per 24 hour  Intake 178 ml  Output 100 ml  Net 78 ml   Filed  Weights   07/21/19 0400  Weight: 58.4 kg     Data Reviewed:   CBC: Recent Labs  Lab 08/02/19 1106  WBC 4.0  NEUTROABS 1.1*  HGB 14.8  HCT 46.4*  MCV 92.8  PLT 620   Basic Metabolic Panel: Recent Labs  Lab 08/02/19 1106 08/04/19 0425 08/04/19 1648 08/05/19 0340 08/05/19 1655 08/06/19 0703 08/06/19 1710  NA 147*   < > 148* 148* 152* 147* 153*  K 4.1   < > 4.4 3.8 4.2 4.6 5.0  CL 109   < > 110 111 113* 110 111  CO2 25   < > 26 25 26 26 27   GLUCOSE 107*   < > 103* 103* 111* 115* 125*  BUN 39*   < > 41* 38* 40* 43* 38*  CREATININE 0.89   < > 0.76 0.77 0.88 1.05* 0.97  CALCIUM 10.0   < > 9.8 9.8 10.7* 10.1 10.9*  MG 2.5*  --   --   --   --   --   --    < > = values in this interval not displayed.   GFR: Estimated Creatinine Clearance: 44.9 mL/min (by C-G formula based on SCr of 0.97 mg/dL). Liver Function Tests: Recent Labs  Lab 08/02/19 1106  AST 44*  ALT 107*  ALKPHOS 114  BILITOT 0.7  PROT 8.4*  ALBUMIN 4.3   No results for input(s): LIPASE, AMYLASE in the last 168 hours. No results for input(s): AMMONIA in the last 168 hours. Coagulation Profile: No results for input(s): INR, PROTIME in the last 168 hours. Cardiac Enzymes: No results for input(s): CKTOTAL, CKMB, CKMBINDEX, TROPONINI in the last 168 hours. BNP (last 3 results) No results for input(s): PROBNP in the last 8760 hours. HbA1C: No results for input(s): HGBA1C in the last 72 hours. CBG: No results for input(s): GLUCAP in the last 168 hours. Lipid Profile: No results for input(s): CHOL, HDL, LDLCALC, TRIG, CHOLHDL, LDLDIRECT in the last 72 hours. Thyroid Function Tests: No results for input(s): TSH, T4TOTAL, FREET4, T3FREE, THYROIDAB in the last 72 hours. Anemia Panel: No results for input(s): VITAMINB12, FOLATE, FERRITIN, TIBC, IRON, RETICCTPCT in the last 72 hours. Sepsis Labs: No results for input(s): PROCALCITON, LATICACIDVEN in the last 168 hours.  No results found for this or any  previous visit (from the past 240 hour(s)).       Radiology Studies: No results found.      Scheduled Meds: . aspirin  81 mg Oral Daily  . carvedilol  3.125 mg Oral BID WC  . cholecalciferol  5,000 Units Oral Daily  . divalproex  250 mg Oral Q12H  . enoxaparin (LOVENOX) injection  40 mg Subcutaneous Q24H  . feeding supplement (ENSURE ENLIVE)  237 mL Oral BID BM  . hydrOXYzine  25 mg Oral QHS  . levETIRAcetam  1,500 mg Oral BID  . megestrol  400 mg Oral Daily  . multivitamin with minerals  1 tablet Oral Daily  . QUEtiapine  50 mg Oral QHS   Continuous Infusions:   LOS: 29 days   Time spent= 20 mins  Yaziel Brandon Arsenio Loader, MD [facility]  If 7PM-7AM, please contact night-coverage  08/08/2019, 9:42 AM

## 2019-08-09 DIAGNOSIS — G2119 Other drug induced secondary parkinsonism: Secondary | ICD-10-CM

## 2019-08-09 DIAGNOSIS — I442 Atrioventricular block, complete: Secondary | ICD-10-CM

## 2019-08-09 DIAGNOSIS — R451 Restlessness and agitation: Secondary | ICD-10-CM | POA: Diagnosis not present

## 2019-08-09 NOTE — Progress Notes (Signed)
Hydrologist [facility])  Hospital Liaison: RN note         Notified by [facility] manager of patient/family request for [facility] Palliative services at home after discharge.         Write spoke with husband and answered questions.        [place] Palliative team will follow up with patient after discharge.         Please call with any hospice or palliative related questions.         Thank you for this referral.         Farrel Gordon, RN, CCM  [place] (listed on [place] under Hospice[facility])    [phone]

## 2019-08-09 NOTE — Progress Notes (Signed)
PROGRESS NOTE    Thu Baggett  TIR:[id number] DOB: 22-Feb-1950 DOA: 07/08/2019 PCP: Patient, No Pcp Per   Brief Narrative:  69 yo Patient with past medical history of dementia, seizure disorder and hypertension as well as CAD presents with failure to thrive and behavioral disturbances regarding her dementia. Patient has a prolonged hospital stay due to inability to find placement appropriate for the patient.Psychiatry has cleared the patient for placement. Currently patient is suffering from poor p.o. intake and resultant hypernatremia. Currently plan is continue to engage in goals of care conversation and continue current medication.   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 Behavioral disturbances. Depression with anxiety. Agitation.  Currently resolved as of on 08/05/2019. Patient has been seen and cleared by psychiatry.  Continue chronic Depakote, hydroxyzine and Seroquel.  Initially was supposed to be going to psych facility but now going to ALF as patient refused SNF. Current dose of Seroquel working well therefore no further adjustment necessary for now, can reduce it if she becomes drowsy. Melatonin stopped   Hypernatremia.  Secondary to intravascular volume depletion Hypokalemia. Poor p.o. intake Patient is not allowing any sort of IV fluids or IV Therefore encourage oral intake.  Seizure disorder Continue Keppra and Depakote  Goals of care conversation Initially on medication for agitation which caused excessive drowsiness.  Gradually doses have been reduced.  Encouraging oral hydration. Previously discussed quality of life, and controlling  agitation.  Husband is having discussion with hospice about transitioning the patient.  Urinary tract infection without hematuria Completed antibiotic treatment.  Cultures remain negative  History of CAD. Essential hypertension. Currently stable and chest pain-free.  Continue beta-blocker.  Continue aspirin.  Weight loss, protein calorie malnutrition, moderate Encourage oral intake, continue supplements  Body mass index is 24.33 kg/m.  Interventions:  Eating with assistance     Diet: Regular diet, aspiration precaution DVT Prophylaxis:   enoxaparin (LOVENOX) injection 40 mg Start: 07/08/19 2200    Advance goals of care discussion: DNR  Family Communication:  None  Disposition:  Status is: Inpatient  Remains inpatient appropriate because:Unsafe d/c plan and IV treatments appropriate due to intensity of illness or inability to take PO   Dispo:             Patient From: Home             Planned Disposition: ALF             Expected discharge date-as soon as bed is available            Patient has been medically stable to be discharged whenever she has a placement available.  TOC team has been working on this     Body mass index is 24.33 kg/m.     Subjective: Patient is laying in the bed does not have any complaints.  Difficult to carry on conversation with her given her advanced dementia.  Grossly moving all the extremities.  Review of Systems Otherwise negative except as per HPI, including: Difficult to obtain full review of systems due to her mentation  Examination: Constitutional: Elderly frail laying in the bed Respiratory: Clear to auscultation bilaterally Cardiovascular: Normal sinus rhythm, no rubs Abdomen:  Nontender nondistended good bowel sounds Musculoskeletal: No edema noted Skin: No rashes seen Neurologic: Grossly moving all the extremities but unable to perform full neuro exam as she is not following all commands. Psychiatric: Alert  to name but otherwise poor judgment and insight.  Unable to carry on a full conversation  Objective: Vitals:   08/08/19 1518 08/08/19 1725 08/08/19 2037 08/09/19 0548  BP: 114/62 (!) 115/58 (!) 144/116 (!) 131/119  Pulse: (!) 57 64 64 67  Resp: 18  16 16   Temp: 98.4 F (36.9 C)  (!) 97.4 F (36.3 C) 97.9 F (36.6 C)  TempSrc: Oral     SpO2: 99% 100% 99% 100%  Weight:      Height:        Intake/Output Summary (Last 24 hours) at 08/09/2019 1025 Last data filed at 08/08/2019 1930 Gross per 24 hour  Intake 178 ml  Output 50 ml  Net 128 ml   Filed Weights   07/21/19 0400  Weight: 58.4 kg     Data Reviewed:   CBC: Recent Labs  Lab 08/02/19 1106  WBC 4.0  NEUTROABS 1.1*  HGB 14.8  HCT 46.4*  MCV 92.8  PLT 629   Basic Metabolic Panel: Recent Labs  Lab 08/02/19 1106 08/04/19 0425 08/04/19 1648 08/05/19 0340 08/05/19 1655 08/06/19 0703 08/06/19 1710  NA 147*   < > 148* 148* 152* 147* 153*  K 4.1   < > 4.4 3.8 4.2 4.6 5.0  CL 109   < > 110 111 113* 110 111  CO2 25   < > 26 25 26 26 27   GLUCOSE 107*   < > 103* 103* 111* 115* 125*  BUN 39*   < > 41* 38* 40* 43* 38*  CREATININE 0.89   < > 0.76 0.77 0.88 1.05* 0.97  CALCIUM 10.0   < > 9.8 9.8 10.7* 10.1 10.9*  MG 2.5*  --   --   --   --   --   --    < > = values in this interval not displayed.   GFR: Estimated Creatinine Clearance: 44.9 mL/min (by C-G formula based on SCr of 0.97 mg/dL). Liver Function Tests: Recent Labs  Lab 08/02/19 1106  AST 44*  ALT 107*  ALKPHOS 114  BILITOT 0.7  PROT 8.4*  ALBUMIN 4.3   No results for input(s): LIPASE, AMYLASE in the last 168 hours. No results for input(s): AMMONIA in the last 168 hours. Coagulation Profile: No results for input(s): INR, PROTIME in the last 168 hours. Cardiac Enzymes: No results for input(s): CKTOTAL, CKMB, CKMBINDEX, TROPONINI in the last 168 hours. BNP (last 3 results) No results for input(s): PROBNP in the last 8760 hours. HbA1C: No results  for input(s): HGBA1C in the last 72 hours. CBG: No results for input(s): GLUCAP in the last 168 hours. Lipid Profile: No results for input(s): CHOL, HDL, LDLCALC, TRIG, CHOLHDL, LDLDIRECT in the last 72 hours. Thyroid Function Tests: No results for input(s): TSH, T4TOTAL, FREET4, T3FREE, THYROIDAB in the last 72 hours. Anemia Panel: No results for input(s): VITAMINB12, FOLATE, FERRITIN, TIBC, IRON, RETICCTPCT in the last 72 hours. Sepsis Labs: No results for input(s): PROCALCITON, LATICACIDVEN in the last 168 hours.  No results found for this or any previous visit (from the past 240 hour(s)).       Radiology Studies: No results found.      Scheduled Meds: . aspirin  81 mg Oral Daily  . carvedilol  3.125 mg Oral BID  WC  . cholecalciferol  5,000 Units Oral Daily  . divalproex  250 mg Oral Q12H  . enoxaparin (LOVENOX) injection  40 mg Subcutaneous Q24H  . feeding supplement (ENSURE ENLIVE)  237 mL Oral BID BM  . hydrOXYzine  25 mg Oral QHS  . levETIRAcetam  1,500 mg Oral BID  . megestrol  400 mg Oral Daily  . multivitamin with minerals  1 tablet Oral Daily  . QUEtiapine  50 mg Oral QHS   Continuous Infusions:   LOS: 30 days   Time spent= 20 mins    Hillary Struss Arsenio Loader, MD [facility]  If 7PM-7AM, please contact night-coverage  08/09/2019, 10:25 AM

## 2019-08-10 DIAGNOSIS — R451 Restlessness and agitation: Secondary | ICD-10-CM | POA: Diagnosis not present

## 2019-08-10 NOTE — Progress Notes (Signed)
PROGRESS NOTE    Lisa Mclaughlin  LOV:[id number] DOB: 1950/03/26 DOA: 07/08/2019 PCP: Patient, No Pcp Per   Brief Narrative:  69 yo Patient with past medical history of dementia, seizure disorder and hypertension as well as CAD presents with failure to thrive and behavioral disturbances regarding her dementia. Patient has a prolonged hospital stay due to inability to find placement appropriate for the patient.Psychiatry has cleared the patient for placement. Currently patient is suffering from poor p.o. intake and resultant hypernatremia. Currently plan is continue to engage in goals of care conversation and continue current medication.   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 Behavioral disturbances.  Intermittent Depression with anxiety. Agitation.  Currently resolved as of on 08/05/2019. Patient has been seen and cleared by psychiatry.  Continue chronic Depakote, hydroxyzine and Seroquel.  Initially was supposed to be going to psych facility but now going to ALF as patient refused SNF. Current dose of Seroquel working well therefore no further adjustment necessary for now, can reduce it if she becomes drowsy. Melatonin stopped   Hypernatremia.  Secondary to intravascular volume depletion Hypokalemia. Poor p.o. intake Patient not allowing any IV fluids or IV line.  Therefore encourage oral intake.  Seizure disorder Continue Keppra and Depakote  Goals of care conversation Initially on medication for agitation which caused excessive drowsiness.  Gradually doses have been reduced.  Encouraging oral hydration. Previously discussed quality of life, and controlling  agitation.  Husband is having discussion with hospice about transitioning the patient.  Urinary tract infection without hematuria Completed antibiotic treatment.  Cultures remain negative  History of CAD. Essential hypertension. Currently stable and chest pain-free.  Continue beta-blocker.  Continue aspirin.  Weight loss, protein calorie malnutrition, moderate Encourage oral intake, continue supplements  Body mass index is 24.33 kg/m.  Interventions:  Eating with assistance     Diet: Regular diet, aspiration precaution DVT Prophylaxis:   enoxaparin (LOVENOX) injection 40 mg Start: 07/08/19 2200    Advance goals of care discussion: DNR  Family Communication:  Son has been periodically been updated.  Called his son Lisa Mclaughlin today but no response  Disposition:  Status is: Inpatient  Remains inpatient appropriate because:Unsafe d/c plan and IV treatments appropriate due to intensity of illness or inability to take PO   Dispo:             Patient From: Home             Planned Disposition: ALF             Expected discharge date-as soon as bed is available            Patient has been medically stable to be discharged whenever she has a placement available.  TOC team has been working on this     Body mass index is 24.33 kg/m.     Subjective: Patient attempting to eat her breakfast with the help of nurse tech this morning.  No complaints  Review of Systems Otherwise negative except as per HPI, including: Difficult to obtain full review of systems due to her mentation  Examination: Constitutional: Not in acute distress Respiratory: Clear to auscultation bilaterally Cardiovascular: Normal sinus rhythm, no rubs  Abdomen: Nontender nondistended good bowel sounds Musculoskeletal: No edema noted Skin: No rashes seen Neurologic: Difficult to assess as she does not follow all the commands but exam is nonfocal. Psychiatric: Poor judgment and insight.  She is  awake and alert but not oriented at all. Objective: Vitals:   08/09/19 0548 08/09/19 1436 08/09/19 1952 08/10/19 0501  BP: (!) 131/119 (!) 141/72 (!) 126/115 (!) 144/75  Pulse: 67 77 74 85  Resp: 16 18 18 16   Temp: 97.9 F (36.6 C) 97.7 F (36.5 C) 98.2 F (36.8 C) 97.7 F (36.5 C)  TempSrc:  Oral Oral Oral  SpO2: 100% 95% 100% 99%  Weight:      Height:        Intake/Output Summary (Last 24 hours) at 08/10/2019 0756 Last data filed at 08/10/2019 0600 Gross per 24 hour  Intake 596 ml  Output --  Net 596 ml   Filed Weights   07/21/19 0400  Weight: 58.4 kg     Data Reviewed:   CBC: No results for input(s): WBC, NEUTROABS, HGB, HCT, MCV, PLT in the last 168 hours. Basic Metabolic Panel: Recent Labs  Lab 08/04/19 1648 08/05/19 0340 08/05/19 1655 08/06/19 0703 08/06/19 1710  NA 148* 148* 152* 147* 153*  K 4.4 3.8 4.2 4.6 5.0  CL 110 111 113* 110 111  CO2 26 25 26 26 27   GLUCOSE 103* 103* 111* 115* 125*  BUN 41* 38* 40* 43* 38*  CREATININE 0.76 0.77 0.88 1.05* 0.97  CALCIUM 9.8 9.8 10.7* 10.1 10.9*   GFR: Estimated Creatinine Clearance: 44.9 mL/min (by C-G formula based on SCr of 0.97 mg/dL). Liver Function Tests: No results for input(s): AST, ALT, ALKPHOS, BILITOT, PROT, ALBUMIN in the last 168 hours. No results for input(s): LIPASE, AMYLASE in the last 168 hours. No results for input(s): AMMONIA in the last 168 hours. Coagulation Profile: No results for input(s): INR, PROTIME in the last 168 hours. Cardiac Enzymes: No results for input(s): CKTOTAL, CKMB, CKMBINDEX, TROPONINI in the last 168 hours. BNP (last 3 results) No results for input(s): PROBNP in the last 8760 hours. HbA1C: No results for input(s): HGBA1C in the last 72 hours. CBG: No results for input(s): GLUCAP in the last 168 hours. Lipid Profile: No results for input(s): CHOL, HDL, LDLCALC, TRIG, CHOLHDL, LDLDIRECT in the last 72 hours. Thyroid Function Tests: No results for input(s): TSH,  T4TOTAL, FREET4, T3FREE, THYROIDAB in the last 72 hours. Anemia Panel: No results for input(s): VITAMINB12, FOLATE, FERRITIN, TIBC, IRON, RETICCTPCT in the last 72 hours. Sepsis Labs: No results for input(s): PROCALCITON, LATICACIDVEN in the last 168 hours.  No results found for this or any previous visit (from the past 240 hour(s)).       Radiology Studies: No results found.      Scheduled Meds: . aspirin  81 mg Oral Daily  . carvedilol  3.125 mg Oral BID WC  . cholecalciferol  5,000 Units Oral Daily  . divalproex  250 mg Oral Q12H  . enoxaparin (LOVENOX) injection  40 mg Subcutaneous Q24H  . feeding supplement (ENSURE ENLIVE)  237 mL Oral BID BM  . hydrOXYzine  25 mg Oral QHS  . levETIRAcetam  1,500 mg Oral BID  . megestrol  400 mg Oral Daily  . multivitamin with minerals  1 tablet Oral Daily  . QUEtiapine  50 mg Oral QHS   Continuous Infusions:   LOS: 31 days   Time spent= 20 mins    Bucky Grigg Arsenio Loader, MD  [facility]  If 7PM-7AM, please contact night-coverage  08/10/2019, 7:56 AM

## 2019-08-11 DIAGNOSIS — R451 Restlessness and agitation: Secondary | ICD-10-CM | POA: Diagnosis not present

## 2019-08-11 NOTE — Progress Notes (Signed)
Current Palliative:  Manufacturing engineer [facility]) Community Based Palliative Care        This patient ha been referred for our palliative care services in the community.  [facility] will continue to follow for any discharge planning needs and to coordinate initiation of palliative care.    If you have questions or need assistance, please call [phone] or contact the hospital Liaison listed on [facility].        Domenic Moras, BSN, RN [address] [facility] Liaison   [phone] (24h on call)

## 2019-08-11 NOTE — TOC Progression Note (Signed)
Transition of Care [facility]) - Progression Note    Patient Details  Name: Lisa Mclaughlin MRN: [id number] Date of Birth: 02/28/1950  Transition of Care [facility]) CM/SW Contact  Shade Flood, LCSW Phone Number: 08/11/2019, 11:04 AM  Clinical Narrative:     TOC following. Spoke with Lovena Le at [facility] to inquire on status of referral. Lovena Le indicated she was still waiting on clinical information from [facility]. This LCSW then spoke with [facility] at [facility] to request follow up with [facility]. Spoke again with Lovena Le who stated she did receive a call and fax from [place] and that she will be forwarding the information to her supervisor for admission decision. She indicates that they should have decision tomorrow.  Also left message for Melissa at [facility] and [place] at [facility] to follow up on referral. Awaiting return calls.  Expected Discharge Plan: Skilled Nursing Facility Barriers to Discharge: Other (comment) (Husband and facility working out particulars of self pay)  Expected Discharge Plan and Services Expected Discharge Plan: [place] Choice: [place] arrangements for the past 2 months: [place] Expected Discharge Date: 07/29/19                                     Social Determinants of Health (SDOH) Interventions    Readmission Risk Interventions No flowsheet data found.

## 2019-08-11 NOTE — Progress Notes (Signed)
PROGRESS NOTE    Lisa Mclaughlin  OYD:[id number] DOB: November 12, 1950 DOA: 07/08/2019 PCP: Patient, No Pcp Per   Brief Narrative:  69 yo Patient with past medical history of dementia, seizure disorder and hypertension as well as CAD presents with failure to thrive and behavioral disturbances regarding her dementia. Patient has a prolonged hospital stay due to inability to find placement appropriate for the patient.Psychiatry has cleared the patient for placement. Currently patient is suffering from poor p.o. intake and resultant hypernatremia. Currently plan is continue to engage in goals of care conversation and continue current medication.   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 Behavioral disturbances.  Improved Depression with anxiety. Agitation.  Currently resolved as of on 08/05/2019. Patient has been seen and cleared by psychiatry.  Continue chronic Depakote, hydroxyzine and Seroquel.  Initially was supposed to be going to psych facility but now going to ALF as patient refused SNF. Current dose of Seroquel working well therefore no further adjustment necessary for now, can reduce it if she becomes drowsy. Melatonin stopped   Hypernatremia.  Secondary to intravascular volume depletion Poor p.o. intake Patient not allowing to have IV line placed for IV fluids.  Encouraging oral intake  Seizure disorder Continue Keppra and Depakote  Goals of care conversation Initially on medication for agitation which caused excessive drowsiness.  Gradually doses have been reduced.  Encouraging oral hydration. Previously discussed quality of life, and controlling agitation.   Husband is having discussion with hospice about transitioning the patient.  Urinary tract infection without hematuria Completed antibiotic treatment.  Cultures remain negative  History of CAD. Essential hypertension. Currently stable and chest pain-free.  Continue beta-blocker.  Continue aspirin.  Weight loss, protein calorie malnutrition, moderate Adult failure to thrive Encourage oral intake, continue supplements  Body mass index is 24.33 kg/m.  Interventions:  Eating with assistance     Diet: Regular diet, aspiration precaution DVT Prophylaxis:   enoxaparin (LOVENOX) injection 40 mg Start: 07/08/19 2200    Advance goals of care discussion: DNR  Family Communication:  Family periodically updated. Louie Casa updated today.   Disposition:  Status is: Inpatient  Remains inpatient appropriate because:Unsafe d/c plan and IV treatments appropriate due to intensity of illness or inability to take PO   Dispo:             Patient From: Home             Planned Disposition: ALF             Expected discharge date-as soon as bed is available            Patient is medically stable pending placement   Body mass index is 24.33 kg/m.  Subjective: No acute event overnight. Still continues to have poor oral intake.   Review of Systems Otherwise negative except as per HPI, including: Denies any complaints but difficult to obtain full review of systems  Examination: Constitutional: NAD, laying in the bed. Elderly frail, appears chronically ill, bilateral temporal wasting Respiratory: Clear to auscultation bilaterally Cardiovascular: Normal sinus rhythm, no rubs Abdomen: Nontender nondistended good bowel sounds Musculoskeletal: No edema noted Skin: No rashes seen Neurologic: Unable to fully assess Psychiatric: Poor  judgment and insight   Objective: Vitals:   08/10/19 1430 08/10/19 2211 08/11/19 0647 08/11/19 0650  BP: (!) 152/132 (!) 128/97 (!) 160/119 140/68   Pulse: 67 74 (!) 58 60  Resp: 18 18 15 17   Temp: 97.8 F (36.6 C) 98.2 F (36.8 C) 98.2 F (36.8 C)   TempSrc: Axillary Oral Oral   SpO2: 100% 98% 100%   Weight:      Height:        Intake/Output Summary (Last 24 hours) at 08/11/2019 0801 Last data filed at 08/11/2019 0600 Gross per 24 hour  Intake 417 ml  Output 250 ml  Net 167 ml   Filed Weights   07/21/19 0400  Weight: 58.4 kg     Data Reviewed:   CBC: No results for input(s): WBC, NEUTROABS, HGB, HCT, MCV, PLT in the last 168 hours. Basic Metabolic Panel: Recent Labs  Lab 08/04/19 1648 08/05/19 0340 08/05/19 1655 08/06/19 0703 08/06/19 1710  NA 148* 148* 152* 147* 153*  K 4.4 3.8 4.2 4.6 5.0  CL 110 111 113* 110 111  CO2 26 25 26 26 27   GLUCOSE 103* 103* 111* 115* 125*  BUN 41* 38* 40* 43* 38*  CREATININE 0.76 0.77 0.88 1.05* 0.97  CALCIUM 9.8 9.8 10.7* 10.1 10.9*   GFR: Estimated Creatinine Clearance: 44.9 mL/min (by C-G formula based on SCr of 0.97 mg/dL). Liver Function Tests: No results for input(s): AST, ALT, ALKPHOS, BILITOT, PROT, ALBUMIN in the last 168 hours. No results for input(s): LIPASE, AMYLASE in the last 168 hours. No results for input(s): AMMONIA in the last 168 hours. Coagulation Profile: No results for input(s): INR, PROTIME in the last 168 hours. Cardiac Enzymes: No results for input(s): CKTOTAL, CKMB, CKMBINDEX, TROPONINI in the last 168 hours. BNP (last 3 results) No results for input(s): PROBNP in the last 8760 hours. HbA1C: No results for input(s): HGBA1C in the last 72 hours. CBG: No results for input(s): GLUCAP in the last 168 hours. Lipid Profile: No results for input(s): CHOL, HDL, LDLCALC, TRIG, CHOLHDL, LDLDIRECT in the last 72 hours. Thyroid Function Tests: No results for input(s): TSH, T4TOTAL, FREET4, T3FREE, THYROIDAB in the last 72 hours. Anemia Panel: No results for input(s): VITAMINB12, FOLATE, FERRITIN, TIBC, IRON, RETICCTPCT in the last 72 hours. Sepsis  Labs: No results for input(s): PROCALCITON, LATICACIDVEN in the last 168 hours.  No results found for this or any previous visit (from the past 240 hour(s)).       Radiology Studies: No results found.      Scheduled Meds:  aspirin  81 mg Oral Daily   carvedilol  3.125 mg Oral BID WC   cholecalciferol  5,000 Units Oral Daily   divalproex  250 mg Oral Q12H   enoxaparin (LOVENOX) injection  40 mg Subcutaneous Q24H   feeding supplement (ENSURE ENLIVE)  237 mL Oral BID BM   hydrOXYzine  25 mg Oral QHS   levETIRAcetam  1,500 mg Oral BID   megestrol  400 mg Oral Daily   multivitamin with minerals  1 tablet Oral Daily   QUEtiapine  50 mg Oral QHS   Continuous Infusions:   LOS: 32 days   Time spent= 20 mins    Koby Hartfield Arsenio Loader, MD [facility]  If 7PM-7AM, please contact night-coverage  08/11/2019, 8:01 AM

## 2019-08-12 DIAGNOSIS — R451 Restlessness and agitation: Secondary | ICD-10-CM | POA: Diagnosis not present

## 2019-08-12 NOTE — Progress Notes (Signed)
PROGRESS NOTE    Lisa Mclaughlin  PPI:[id number] DOB: 1950/03/18 DOA: 07/08/2019 PCP: Patient, No Pcp Per   Brief Narrative:  69 yo Patient with past medical history of dementia, seizure disorder and hypertension as well as CAD presents with failure to thrive and behavioral disturbances regarding her dementia. Patient has a prolonged hospital stay due to inability to find placement appropriate for the patient.Psychiatry has cleared the patient for placement. Currently patient is suffering from poor p.o. intake and resultant hypernatremia. Currently plan is continue to engage in goals of care conversation and continue current medication.   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 Behavioral disturbances.  Improved Depression with anxiety. Agitation.  Currently resolved as of on 08/05/2019. Patient has been seen and cleared by psychiatry.  Continue chronic Depakote, hydroxyzine and Seroquel.  Initially was supposed to be going to psych facility but now going to ALF as patient refused SNF. Current dose of Seroquel working well therefore no further adjustment necessary for now, can reduce it if she becomes drowsy. Melatonin stopped   Hypernatremia.  Secondary to intravascular volume depletion Poor p.o. intake Patient not allowing to have IV line placed for IV fluids.  Encouraging oral intake  Seizure disorder Continue Keppra and Depakote  Goals of care conversation Initially on medication for agitation which caused excessive drowsiness.  Gradually doses have been reduced.  Encouraging oral hydration. Previously discussed quality of life, and controlling agitation.   Husband is having discussion with hospice about transitioning the patient.  Urinary tract infection without hematuria Completed antibiotic treatment.  Cultures remain negative  History of CAD. Essential hypertension. Currently stable and chest pain-free.  Continue beta-blocker.  Continue aspirin.  Weight loss, protein calorie malnutrition, moderate Adult failure to thrive Encourage oral intake, continue supplements  Body mass index is 24.33 kg/m.  Interventions:  Eating with assistance     Diet: Regular diet, aspiration precaution DVT Prophylaxis:   enoxaparin (LOVENOX) injection 40 mg Start: 07/08/19 2200    Advance goals of care discussion: DNR  Family Communication:  Family been updated periodically, spoke with her spouse yesterday.  Disposition:  Status is: Inpatient  Remains inpatient appropriate because:Unsafe d/c plan and IV treatments appropriate due to intensity of illness or inability to take PO   Dispo:             Patient From: Home             Planned Disposition: ALF             Expected discharge date-as soon as bed is available            Patient is medically stable pending placement   Body mass index is 24.33 kg/m.  Subjective: Laying in the bed attempting to eat her breakfast.  Review of Systems Otherwise negative except as per HPI, including: Denies any complaints but difficult to obtain full review of systems  Examination: Constitutional: Not in acute distress, elderly and chronically ill-appearing Respiratory: Clear to auscultation bilaterally Cardiovascular: Normal sinus rhythm, no rubs Abdomen: Nontender nondistended good bowel sounds Musculoskeletal: No edema noted Skin: No rashes seen Neurologic: Grossly moving all the extremities.  No focal neuro deficits Psychiatric: Alert  to name only, poor judgment and insight   Objective: Vitals:   08/11/19 0650 08/11/19 1346 08/11/19 2205 08/12/19 0455  BP: 140/68 (!) 126/49  109/65 115/85  Pulse: 60 60 71 62  Resp: 17 15 18 17   Temp:  98.4 F (36.9 C)  98.1 F (36.7 C)  TempSrc:  Oral    SpO2:  100% 96% 100%  Weight:      Height:        Intake/Output Summary (Last 24 hours) at 08/12/2019 0742 Last data filed at 08/11/2019 1838 Gross per 24 hour  Intake 700 ml  Output --  Net 700 ml   Filed Weights   07/21/19 0400  Weight: 58.4 kg     Data Reviewed:   CBC: No results for input(s): WBC, NEUTROABS, HGB, HCT, MCV, PLT in the last 168 hours. Basic Metabolic Panel: Recent Labs  Lab 08/05/19 1655 08/06/19 0703 08/06/19 1710  NA 152* 147* 153*  K 4.2 4.6 5.0  CL 113* 110 111  CO2 26 26 27   GLUCOSE 111* 115* 125*  BUN 40* 43* 38*  CREATININE 0.88 1.05* 0.97  CALCIUM 10.7* 10.1 10.9*   GFR: Estimated Creatinine Clearance: 44.9 mL/min (by C-G formula based on SCr of 0.97 mg/dL). Liver Function Tests: No results for input(s): AST, ALT, ALKPHOS, BILITOT, PROT, ALBUMIN in the last 168 hours. No results for input(s): LIPASE, AMYLASE in the last 168 hours. No results for input(s): AMMONIA in the last 168 hours. Coagulation Profile: No results for input(s): INR, PROTIME in the last 168 hours. Cardiac Enzymes: No results for input(s): CKTOTAL, CKMB, CKMBINDEX, TROPONINI in the last 168 hours. BNP (last 3 results) No results for input(s): PROBNP in the last 8760 hours. HbA1C: No results for input(s): HGBA1C in the last 72 hours. CBG: No results for input(s): GLUCAP in the last 168 hours. Lipid Profile: No results for input(s): CHOL, HDL, LDLCALC, TRIG, CHOLHDL, LDLDIRECT in the last 72 hours. Thyroid Function Tests: No results for input(s): TSH, T4TOTAL, FREET4, T3FREE, THYROIDAB in the last 72 hours. Anemia Panel: No results for input(s): VITAMINB12, FOLATE, FERRITIN, TIBC, IRON, RETICCTPCT in the last 72 hours. Sepsis Labs: No results for input(s): PROCALCITON, LATICACIDVEN in the last 168 hours.  No results found for this or any previous  visit (from the past 240 hour(s)).       Radiology Studies: No results found.      Scheduled Meds: . aspirin  81 mg Oral Daily  . carvedilol  3.125 mg Oral BID WC  . cholecalciferol  5,000 Units Oral Daily  . divalproex  250 mg Oral Q12H  . enoxaparin (LOVENOX) injection  40 mg Subcutaneous Q24H  . feeding supplement (ENSURE ENLIVE)  237 mL Oral BID BM  . hydrOXYzine  25 mg Oral QHS  . levETIRAcetam  1,500 mg Oral BID  . megestrol  400 mg Oral Daily  . multivitamin with minerals  1 tablet Oral Daily  . QUEtiapine  50 mg Oral QHS   Continuous Infusions:   LOS: 33 days   Time spent= 20 mins    Elohim Brune Arsenio Loader, MD [facility]  If 7PM-7AM, please contact night-coverage  08/12/2019, 7:42 AM

## 2019-08-12 NOTE — TOC Progression Note (Signed)
Transition of Care [facility]) - Progression Note    Patient Details  Name: Lisa Mclaughlin MRN: [id number] Date of Birth: December 13, 1950  Transition of Care [facility]) CM/SW [place], [place] Phone Number: 08/12/2019, 3:03 PM  Clinical Narrative:  Clinton Gallant to ask him about a decision.  "I will call you in the AM to let you know our fainal decision." TOC will continue to follow during the course of hospitalization.      Expected Discharge Plan: Skilled Nursing Facility Barriers to Discharge: Other (comment) (Husband and facility working out particulars of self pay)  Expected Discharge Plan and Services Expected Discharge Plan: [place] Choice: [place] arrangements for the past 2 months: [place] Expected Discharge Date: 07/29/19                                     Social Determinants of Health (SDOH) Interventions    Readmission Risk Interventions No flowsheet data found.

## 2019-08-13 DIAGNOSIS — R451 Restlessness and agitation: Secondary | ICD-10-CM | POA: Diagnosis not present

## 2019-08-13 NOTE — TOC Progression Note (Signed)
Transition of Care [facility]) - Progression Note    Patient Details  Name: Lisa Mclaughlin MRN: [id number] Date of Birth: [birth year]  Transition of Care [facility]) CM/SW [place], [place] Phone Number: 08/13/2019, 4:11 PM  Clinical Narrative:   Six days later, [facility] still doesn't have an answer.  I asked them for an answer by the end of the day.  Lovena Le said she had no control over that, but regional director is leaning towards no.  I thanked her for her time, and spoke with husband about expanding out search, to which he readily agreed.  Jasmine from [facility] will be here on Friday at 8:30A to interview patient. TOC will continue to follow during the course of hospitalization.     Expected Discharge Plan: Skilled Nursing Facility Barriers to Discharge: Other (comment) (Husband and facility working out particulars of self pay)  Expected Discharge Plan and Services Expected Discharge Plan: [place] Choice: [place] arrangements for the past 2 months: [place] Expected Discharge Date: 07/29/19                                     Social Determinants of Health (SDOH) Interventions    Readmission Risk Interventions No flowsheet data found.

## 2019-08-13 NOTE — Progress Notes (Signed)
PROGRESS NOTE    Lisa Mclaughlin  OMV:[id number] DOB: [birth year] DOA: 07/08/2019 PCP: Patient, No Pcp Per   Brief Narrative:  69 yo Patient with past medical history of dementia, seizure disorder and hypertension as well as CAD presents with failure to thrive and behavioral disturbances regarding her dementia. Patient has a prolonged hospital stay due to inability to find placement appropriate for the patient.Psychiatry has cleared the patient for placement. Currently patient is suffering from poor p.o. intake and resultant hypernatremia. Currently plan is continue to engage in goals of care conversation and continue current medication.   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 Behavioral disturbances.  Improved Depression with anxiety. Agitation.  Currently resolved as of on 08/05/2019. Patient has been seen and cleared by psychiatry.  Continue chronic Depakote, hydroxyzine and Seroquel.  Initially was supposed to be going to psych facility but now going to ALF as patient refused SNF. Current dose of Seroquel working well Melatonin stopped   Hypernatremia.  Secondary to intravascular volume depletion Poor p.o. intake Patient not allowing to have IV line placed for IV fluids.  Encouraging oral intake  Seizure disorder Continue Keppra and Depakote  Goals of care conversation Initially on medication for agitation which caused excessive drowsiness.  Gradually doses have been reduced.  Encouraging oral hydration. Previously discussed quality of life, and controlling agitation.  Husband is having discussion with hospice about transitioning the patient.  Urinary  tract infection without hematuria Completed antibiotic treatment.  Cultures remain negative  History of CAD. Essential hypertension. Currently stable and chest pain-free.  Continue beta-blocker.  Continue aspirin.  Weight loss, protein calorie malnutrition, moderate Adult failure to thrive Encourage oral intake, continue supplements  Body mass index is 24.33 kg/m.  Interventions:  Eating with assistance     Diet: Regular diet, aspiration precaution DVT Prophylaxis:   enoxaparin (LOVENOX) injection 40 mg Start: 07/08/19 2200    Advance goals of care discussion: DNR  Family Communication:  [place] updated periodically Disposition:  Status is: Inpatient  Remains inpatient appropriate because:Unsafe d/c plan and IV treatments appropriate due to intensity of illness or inability to take PO   Dispo:             Patient From: Home             Planned Disposition: ALF             Expected discharge date-as soon as bed is available            Patient is medically stable pending placement   Body mass index is 24.33 kg/m.  Subjective: Laying in the bed, no complaints this morning  Review of Systems Otherwise negative except as per HPI, including: Denies any complaints but difficult to obtain full review of systems  Examination: Constitutional: Not in acute distress, elderly frail Respiratory: Clear to auscultation bilaterally Cardiovascular: Normal sinus rhythm, no rubs Abdomen: Nontender nondistended good bowel sounds Musculoskeletal: No edema noted Skin: No rashes seen Neurologic: Difficult to assess, grossly moving all the extremities Psychiatric: Alert to name but poor judgment and insight   Objective: Vitals:   08/12/19 1434 08/12/19 1946 08/13/19 0500 08/13/19 0855  BP: (!) 110/57 114/69  111/74 100/74  Pulse: 67 70 76 77  Resp: 14 18 19    Temp: 98.3 F (36.8 C) 98.2 F (36.8 C) 98.1 F (36.7 C)   TempSrc: Oral     SpO2: 100% 96% 97% 96%   Weight:      Height:        Intake/Output Summary (Last 24 hours) at 08/13/2019 1125 Last data filed at 08/12/2019 1719 Gross per 24 hour  Intake 532 ml  Output --  Net 532 ml   Filed Weights   07/21/19 0400  Weight: 58.4 kg     Data Reviewed:   CBC: No results for input(s): WBC, NEUTROABS, HGB, HCT, MCV, PLT in the last 168 hours. Basic Metabolic Panel: Recent Labs  Lab 08/06/19 1710  NA 153*  K 5.0  CL 111  CO2 27  GLUCOSE 125*  BUN 38*  CREATININE 0.97  CALCIUM 10.9*   GFR: Estimated Creatinine Clearance: 44.9 mL/min (by C-G formula based on SCr of 0.97 mg/dL). Liver Function Tests: No results for input(s): AST, ALT, ALKPHOS, BILITOT, PROT, ALBUMIN in the last 168 hours. No results for input(s): LIPASE, AMYLASE in the last 168 hours. No results for input(s): AMMONIA in the last 168 hours. Coagulation Profile: No results for input(s): INR, PROTIME in the last 168 hours. Cardiac Enzymes: No results for input(s): CKTOTAL, CKMB, CKMBINDEX, TROPONINI in the last 168 hours. BNP (last 3 results) No results for input(s): PROBNP in the last 8760 hours. HbA1C: No results for input(s): HGBA1C in the last 72 hours. CBG: No results for input(s): GLUCAP in the last 168 hours. Lipid Profile: No results for input(s): CHOL, HDL, LDLCALC, TRIG, CHOLHDL, LDLDIRECT in the last 72 hours. Thyroid Function Tests: No results for input(s): TSH, T4TOTAL, FREET4, T3FREE, THYROIDAB in the last 72 hours. Anemia Panel: No results for input(s): VITAMINB12, FOLATE, FERRITIN, TIBC, IRON, RETICCTPCT in the last 72 hours. Sepsis Labs: No results for input(s): PROCALCITON, LATICACIDVEN in the last 168 hours.  No results found for this or any previous visit (from the past 240 hour(s)).       Radiology Studies: No results found.      Scheduled Meds: . aspirin  81 mg Oral Daily  . carvedilol  3.125 mg Oral BID WC  . cholecalciferol  5,000 Units Oral Daily  . divalproex  250 mg  Oral Q12H  . enoxaparin (LOVENOX) injection  40 mg Subcutaneous Q24H  . feeding supplement (ENSURE ENLIVE)  237 mL Oral BID BM  . hydrOXYzine  25 mg Oral QHS  . levETIRAcetam  1,500 mg Oral BID  . megestrol  400 mg Oral Daily  . multivitamin with minerals  1 tablet Oral Daily  . QUEtiapine  50 mg Oral QHS   Continuous Infusions:   LOS: 34 days   Time spent= 20 mins    Stormee Duda Arsenio Loader, MD [facility]  If 7PM-7AM, please contact night-coverage  08/13/2019, 11:25 AM

## 2019-08-14 DIAGNOSIS — R451 Restlessness and agitation: Secondary | ICD-10-CM | POA: Diagnosis not present

## 2019-08-14 NOTE — Progress Notes (Signed)
PROGRESS NOTE    Lisa Mclaughlin  OMV:[id number] DOB: [birth year] DOA: 07/08/2019 PCP: Patient, No Pcp Per   Brief Narrative:  69 yo Patient with past medical history of dementia, seizure disorder and hypertension as well as CAD presents with failure to thrive and behavioral disturbances regarding her dementia. Patient has a prolonged hospital stay due to inability to find placement appropriate for the patient.Psychiatry has cleared the patient for placement. Currently patient is suffering from poor p.o. intake and resultant hypernatremia. Currently plan is continue to engage in goals of care conversation and continue current medication.  Patient is currently awaiting placement.  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 Behavioral disturbances.  Improved Depression with anxiety. Agitation.  Currently resolved as of on 08/05/2019. Patient has been seen and cleared by psychiatry.  Continue chronic Depakote, hydroxyzine and Seroquel.  Initially was supposed to be going to psych facility but now going to ALF as patient refused SNF. Current dose of Seroquel working well, if necessary can get Haldol Melatonin stopped   Hypernatremia.  Secondary to intravascular volume depletion Poor p.o. intake Encourage oral intake, refusing IV fluids or IV line.  Seizure disorder Continue Keppra and Depakote  Goals of care conversation Initially on medication for agitation which caused excessive drowsiness.  Gradually doses have been reduced.  Encouraging oral hydration. Previously discussed quality of life, and controlling agitation.    Urinary tract infection without  hematuria Completed antibiotic treatment.  Cultures remain negative  History of CAD. Essential hypertension. Currently stable and chest pain-free.  Continue beta-blocker.  Continue aspirin.  Weight loss, protein calorie malnutrition, moderate Adult failure to thrive Encourage oral intake, continue supplements  Body mass index is 24.33 kg/m.  Interventions:  Eating with assistance     Diet: Regular diet, aspiration precaution DVT Prophylaxis:   enoxaparin (LOVENOX) injection 40 mg Start: 07/08/19 2200    Advance goals of care discussion: DNR  Family Communication:  [place] updated periodically Disposition:  Status is: Inpatient  Remains inpatient appropriate because:Unsafe d/c plan and IV treatments appropriate due to intensity of illness or inability to take PO   Dispo:             Patient From: Home             Planned Disposition: ALF             Expected discharge date-as soon as bed is available            Patient is medically stable pending placement   Body mass index is 24.33 kg/m.  Subjective: Work with therapy this morning and during my evaluation she was sleeping as she was tired.  No other complaints.   Examination: Constitutional: Not in acute distress, chronically frail appearing Respiratory: Clear to auscultation bilaterally Cardiovascular: Normal sinus rhythm, no rubs Abdomen: Nontender nondistended good bowel sounds Musculoskeletal: No edema noted Skin: No rashes seen Neurologic: Difficult to assess, grossly moving all the extremities Psychiatric: Alert to name poor judgment and insight   Objective: Vitals:   08/13/19 1701 08/13/19 2144 08/14/19 0500 08/14/19 1017  BP: (!) 104/54 117/65 (!) 111/59 124/60  Pulse: 65 70 69 61  Resp:  18 19  Temp:  98.2 F (36.8 C) 98 F (36.7 C)   TempSrc:  Oral Oral   SpO2: 100% 99% 99%   Weight:      Height:        Intake/Output Summary (Last 24 hours) at 08/14/2019 1117 Last data filed  at 08/14/2019 0957 Gross per 24 hour  Intake 480 ml  Output --  Net 480 ml   Filed Weights   07/21/19 0400  Weight: 58.4 kg     Data Reviewed:   CBC: No results for input(s): WBC, NEUTROABS, HGB, HCT, MCV, PLT in the last 168 hours. Basic Metabolic Panel: No results for input(s): NA, K, CL, CO2, GLUCOSE, BUN, CREATININE, CALCIUM, MG, PHOS in the last 168 hours. GFR: Estimated Creatinine Clearance: 44.9 mL/min (by C-G formula based on SCr of 0.97 mg/dL). Liver Function Tests: No results for input(s): AST, ALT, ALKPHOS, BILITOT, PROT, ALBUMIN in the last 168 hours. No results for input(s): LIPASE, AMYLASE in the last 168 hours. No results for input(s): AMMONIA in the last 168 hours. Coagulation Profile: No results for input(s): INR, PROTIME in the last 168 hours. Cardiac Enzymes: No results for input(s): CKTOTAL, CKMB, CKMBINDEX, TROPONINI in the last 168 hours. BNP (last 3 results) No results for input(s): PROBNP in the last 8760 hours. HbA1C: No results for input(s): HGBA1C in the last 72 hours. CBG: No results for input(s): GLUCAP in the last 168 hours. Lipid Profile: No results for input(s): CHOL, HDL, LDLCALC, TRIG, CHOLHDL, LDLDIRECT in the last 72 hours. Thyroid Function Tests: No results for input(s): TSH, T4TOTAL, FREET4, T3FREE, THYROIDAB in the last 72 hours. Anemia Panel: No results for input(s): VITAMINB12, FOLATE, FERRITIN, TIBC, IRON, RETICCTPCT in the last 72 hours. Sepsis Labs: No results for input(s): PROCALCITON, LATICACIDVEN in the last 168 hours.  No results found for this or any previous visit (from the past 240 hour(s)).       Radiology Studies: No results found.      Scheduled Meds: . aspirin  81 mg Oral Daily  . carvedilol  3.125 mg Oral BID WC  . cholecalciferol  5,000 Units Oral Daily  . divalproex  250 mg Oral Q12H  . enoxaparin (LOVENOX) injection  40 mg Subcutaneous Q24H  . feeding supplement (ENSURE ENLIVE)  237 mL Oral BID BM   . hydrOXYzine  25 mg Oral QHS  . levETIRAcetam  1,500 mg Oral BID  . megestrol  400 mg Oral Daily  . multivitamin with minerals  1 tablet Oral Daily  . QUEtiapine  50 mg Oral QHS   Continuous Infusions:   LOS: 35 days   Time spent= 20 mins    Shawnda Mauney Arsenio Loader, MD [facility]  If 7PM-7AM, please contact night-coverage  08/14/2019, 11:17 AM

## 2019-08-15 DIAGNOSIS — R451 Restlessness and agitation: Secondary | ICD-10-CM | POA: Diagnosis not present

## 2019-08-15 NOTE — Progress Notes (Signed)
PROGRESS NOTE    Lisa Mclaughlin  YSA:[id number] DOB: Aug 07, 1950 DOA: 07/08/2019 PCP: Patient, No Pcp Per   Chief Complaint  Patient presents with  . Altered Mental Status   Brief Narrative: 69 yo Patient with past medical history of dementia, seizure disorder and hypertension as well as CAD presents with failure to thrive and behavioral disturbances regarding her dementia. Patienthasa prolonged hospital stay due to inability to find placement appropriate for the patient.Psychiatry has cleared the patient for placement. Currently patient is suffering from poor p.o. intake and resultant hypernatremia. Currently plan iscontinue to engage in goals of care conversation and continue current medication. At this time patient medically stable and scheduled in 1 placement.  Subjective: Husband at the bedside.  She is alert awake will answer some questions but remains confused.  Calm.   Assessment & Plan:  Alzheimer's dementia advanced with behavioral disturbance/anxiety/depression/agitation: Agitation resolved.  Behavioral disturbance improved.  Seen by psych medication has been adjusted continue current Depakote hydroxyzine Seroquel.  Initial plan was to go to inpatient psych however at this time plan is to ALF   Hypernatremia: 2/2 poor oral intake volume depletion . Patient not allowing to have IV line placed for IV fluids.  Encouraging oral intake  Seizure disorder continue Keppra and Depakote.  GOC: DNR.  Husband is having discussion with hospice about transitioning the patient.  UTI without hematuria completed antibiotic course.  Cultures negative.  History of CAD/essential hypertension: No chest pain.  Continue aspirin, beta-blocker  Protein calorie malnutrition moderate with adult failure to thrive/weight loss, encourage oral intake.  Continue supplementation.  DVT prophylaxis: enoxaparin (LOVENOX) injection 40 mg Start: 07/08/19 2200 Code Status:   Code Status: DNR  Family  Communication: plan of care discussed with patient at bedside.  Status is: Inpatient  Remains inpatient appropriate because:Unsafe d/c plan.  Awaiting for placement   Dispo:  Patient From: Home  Planned Disposition: ALF  Expected discharge date: 08/13/19  Medically stable for discharge: YES   Nutrition: Diet Order            Diet regular Room service appropriate? Yes with Assist; Fluid consistency: Thin  Diet effective now                       Body mass index is 22.37 kg/m.     Consultants:see note  Procedures:see note Microbiology:see note Blood Culture    Component Value Date/Time   SDES  07/08/2019 1545    Urine Performed at [facility], [place] [address] [place], [place] [address]    SPECREQUEST  07/08/2019 1545    NONE Performed at [facility], [place] [address] [place], [place] [address]    CULT  07/08/2019 1545    NO GROWTH Performed at [facility], [place] [address], [place], [place] [address]    REPTSTATUS 07/09/2019 FINAL 07/08/2019 1545    Other culture-see note  Medications: Scheduled Meds: . aspirin  81 mg Oral Daily  . carvedilol  3.125 mg Oral BID WC  . cholecalciferol  5,000 Units Oral Daily  . divalproex  250 mg Oral Q12H  . enoxaparin (LOVENOX) injection  40 mg Subcutaneous Q24H  . feeding supplement (ENSURE ENLIVE)  237 mL Oral BID BM  . hydrOXYzine  25 mg Oral QHS  . levETIRAcetam  1,500 mg Oral BID  . megestrol  400 mg Oral Daily  . multivitamin with minerals  1 tablet Oral Daily  . QUEtiapine  50  mg Oral QHS   Continuous Infusions:  Antimicrobials: Anti-infectives (From admission, onward)   Start     Dose/Rate Route Frequency Ordered Stop   07/09/19 1800  cefTRIAXone (ROCEPHIN) 1 g in sodium chloride 0.9 % 100 mL IVPB        1 g 200 mL/hr over 30 Minutes Intravenous Every 24 hours 07/09/19 0814 07/11/19 1748   07/08/19 1900  cefTRIAXone (ROCEPHIN) 1 g in sodium chloride 0.9 % 100  mL IVPB        1 g 200 mL/hr over 30 Minutes Intravenous  Once 07/08/19 1853 07/08/19 2015       Objective: Vitals: Today's Vitals   08/14/19 1748 08/14/19 2034 08/15/19 0509 08/15/19 0751  BP:  127/69 102/67 119/68  Pulse:  85 66 62  Resp:  18 18 16   Temp:  97.7 F (36.5 C) 98.3 F (36.8 C) 98.1 F (36.7 C)  TempSrc:  Axillary Axillary Oral  SpO2:  97% 100% 98%  Weight: 53.7 kg     Height: 5\' 1"  (1.549 m)     PainSc:        Intake/Output Summary (Last 24 hours) at 08/15/2019 1127 Last data filed at 08/15/2019 1024 Gross per 24 hour  Intake 552 ml  Output --  Net 552 ml   Filed Weights   07/21/19 0400 08/14/19 1748  Weight: 58.4 kg 53.7 kg   Weight change:    Intake/Output from previous day: 08/12 0701 - 08/13 0700 In: 316 [P.O.:316] Out: -  Intake/Output this shift: Total I/O In: 236 [P.O.:236] Out: -   Examination:  General exam: AAOX0-1 ,NAD, weak appearing. HEENT:Oral mucosa moist, Ear/Nose WNL grossly,dentition normal. Respiratory system: bilaterally CLEAR,no wheezing or crackles,no use of accessory muscle, non tender. Cardiovascular system: S1 & S2 +, regular, No JVD. Gastrointestinal system: Abdomen soft, NT,ND, BS+. Nervous System:Alert, awake, moving extremities and grossly nonfocal Extremities: No edema, distal peripheral pulses palpable.  Skin: No rashes,no icterus. MSK: Normal muscle bulk,tone, power  Data Reviewed: I have personally reviewed following labs and imaging studies CBC: No results for input(s): WBC, NEUTROABS, HGB, HCT, MCV, PLT in the last 168 hours. Basic Metabolic Panel: No results for input(s): NA, K, CL, CO2, GLUCOSE, BUN, CREATININE, CALCIUM, MG, PHOS in the last 168 hours. GFR: Estimated Creatinine Clearance: 41.3 mL/min (by C-G formula based on SCr of 0.97 mg/dL). Liver Function Tests: No results for input(s): AST, ALT, ALKPHOS, BILITOT, PROT, ALBUMIN in the last 168 hours. No results for input(s): LIPASE, AMYLASE in  the last 168 hours. No results for input(s): AMMONIA in the last 168 hours. Coagulation Profile: No results for input(s): INR, PROTIME in the last 168 hours. Cardiac Enzymes: No results for input(s): CKTOTAL, CKMB, CKMBINDEX, TROPONINI in the last 168 hours. BNP (last 3 results) No results for input(s): PROBNP in the last 8760 hours. HbA1C: No results for input(s): HGBA1C in the last 72 hours. CBG: No results for input(s): GLUCAP in the last 168 hours. Lipid Profile: No results for input(s): CHOL, HDL, LDLCALC, TRIG, CHOLHDL, LDLDIRECT in the last 72 hours. Thyroid Function Tests: No results for input(s): TSH, T4TOTAL, FREET4, T3FREE, THYROIDAB in the last 72 hours. Anemia Panel: No results for input(s): VITAMINB12, FOLATE, FERRITIN, TIBC, IRON, RETICCTPCT in the last 72 hours. Sepsis Labs: No results for input(s): PROCALCITON, LATICACIDVEN in the last 168 hours.  No results found for this or any previous visit (from the past 240 hour(s)).    Radiology Studies: No results found.   LOS: 36  days   Antonieta Pert, MD [facility]  08/15/2019, 11:27 AM

## 2019-08-15 NOTE — NC FL2 (Signed)
[place] LEVEL OF CARE SCREENING TOOL     IDENTIFICATION  Patient Name: Lisa Mclaughlin Birthdate: October 15, 1950 Sex: female Admission Date (Current Location): 07/08/2019  [facility] and [place] Number:  Herbalist and Address:  [facility],  [place] [address] [place]      Provider Number: [id number]  Attending Physician Name and Address:  Antonieta Pert, MD  Relative Name and Phone Number:  Cherril Hett [phone]  husband    Current Level of Care: Hospital Recommended Level of Care: [place], Memory Care Prior Approval Number:    Date Approved/Denied:   PASRR Number: Pending  Discharge Plan: SNF    Current Diagnoses: Patient Active Problem List   Diagnosis Date Noted  . 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 Rhythm, idioventricular ([place]) 08/09/2019  . Agitation due to dementia ([place]) 07/08/2019  . Alkaline phosphatase elevation 01/19/2017  . Muscular deconditioning 01/08/2017  . Acute respiratory failure ([place])   . Attention deficit disorder   . Seizures ([place])   . Elevated blood pressure reading   . Leukocytosis   . Hypokalemia   . Supplemental oxygen dependent   . Status epilepticus ([place]) 12/26/2016  . Altered mental state   . Rash 12/17/2016  . Cough 05/22/2016  . Coronary artery disease involving native coronary artery of native heart with angina pectoris ([place]) 04/14/2016  . Dyspnea on exertion 01/28/2016  . Bradycardia 01/28/2016  . Colon cancer screening 01/17/2016  . Medicare welcome visit 11/20/2015  . Advance care planning 11/20/2015  . SOB (shortness of breath) on exertion 11/20/2015  . Agitation 10/16/2014  . Edema leg 07/09/2014  . Back pain 11/14/2012  . Hot flashes 05/24/2012  . Orthostasis 11/24/2011  . Dementia in Alzheimer's disease ([place]) 09/20/2011  . IBS (irritable bowel syndrome) 01/04/2011  . Vitamin D deficiency 10/04/2009  . FATIGUE 03/15/2009  . POSTMENOPAUSAL BLEEDING  05/05/2008  . HYPERLIPIDEMIA 10/14/2007  . SCOLIOSIS 10/14/2007  . Depression with anxiety 07/11/2007  . OSTEOPOROSIS 07/11/2007    Orientation RESPIRATION BLADDER Height & Weight     Self  Normal Continent Weight: 53.7 kg Height:  5\' 1"  (154.9 cm)  BEHAVIORAL SYMPTOMS/MOOD NEUROLOGICAL BOWEL NUTRITION STATUS   (None in hospital)  (none) Continent Diet (Cardiac diet)  AMBULATORY STATUS COMMUNICATION OF NEEDS Skin   Limited Assist Verbally Normal                       Personal Care Assistance Level of Assistance  Bathing, Feeding, Dressing Bathing Assistance: Limited assistance Feeding assistance: Limited assistance Dressing Assistance: Limited assistance     Functional Limitations Info  Sight, Hearing, Speech Sight Info: Adequate Hearing Info: Adequate Speech Info: Adequate    SPECIAL CARE FACTORS FREQUENCY  OT (By licensed OT)     PT Frequency: 3-5X weekly OT Frequency: Minimum 5x a week            Contractures Contractures Info: Not present    Additional Factors Info  Code Status, Allergies, Psychotropic Code Status Info: dnr Allergies Info: [name], Wellbutrin, Gluten meal, Tetracycline Hcl, Psychotropic Info: Depakote Sprinkle 250 Q12 hrs. Seroquel 50 QHS         Current Medications (08/15/2019):  This is the current hospital active medication list Current Facility-Administered Medications  Medication Dose Route Frequency Provider Last Rate Last Admin  . acetaminophen (TYLENOL) tablet 650 mg  650 mg Oral Q6H PRN Etta Quill, DO   650 mg at 08/10/19  1021   Or  . acetaminophen (TYLENOL) suppository 650 mg  650 mg Rectal Q6H PRN Etta Quill, DO      . aspirin chewable tablet 81 mg  81 mg Oral Daily Jennette Kettle M, DO   81 mg at 08/15/19 1007  . carvedilol (COREG) tablet 3.125 mg  3.125 mg Oral BID WC Jennette Kettle M, DO   3.125 mg at 08/15/19 1006  . cholecalciferol (VITAMIN D3) tablet 5,000 Units  5,000 Units Oral Daily Etta Quill,  DO   5,000 Units at 08/15/19 1006  . divalproex (DEPAKOTE SPRINKLE) capsule 250 mg  250 mg Oral Q12H Suella Broad, FNP   250 mg at 08/15/19 1007  . enoxaparin (LOVENOX) injection 40 mg  40 mg Subcutaneous Q24H Jennette Kettle M, DO   40 mg at 08/14/19 2048  . feeding supplement (ENSURE ENLIVE) (ENSURE ENLIVE) liquid 237 mL  237 mL Oral BID BM Lavina Hamman, MD   237 mL at 08/15/19 1007  . hydrOXYzine (ATARAX/VISTARIL) tablet 25 mg  25 mg Oral QHS Lavina Hamman, MD   25 mg at 08/14/19 2048  . ibuprofen (ADVIL) tablet 200 mg  200 mg Oral Q6H PRN Etta Quill, DO   200 mg at 08/07/19 2218  . levETIRAcetam (KEPPRA) 100 MG/ML solution 1,500 mg  1,500 mg Oral BID Polly Cobia, RPH   1,500 mg at 08/15/19 1007  . megestrol (MEGACE) 400 MG/10ML suspension 400 mg  400 mg Oral Daily Amin, Ankit Chirag, MD   400 mg at 08/15/19 1007  . multivitamin with minerals tablet 1 tablet  1 tablet Oral Daily Jennette Kettle M, DO   1 tablet at 08/15/19 1006  . ondansetron (ZOFRAN) tablet 4 mg  4 mg Oral Q6H PRN Etta Quill, DO       Or  . ondansetron [facility]) injection 4 mg  4 mg Intravenous Q6H PRN Etta Quill, DO      . QUEtiapine (SEROQUEL) tablet 25 mg  25 mg Oral Daily PRN Lavina Hamman, MD   25 mg at 08/12/19 1428  . QUEtiapine (SEROQUEL) tablet 50 mg  50 mg Oral QHS Lavina Hamman, MD   50 mg at 08/14/19 2049     Discharge Medications: Please see discharge summary for a list of discharge medications.  Relevant Imaging Results:  Relevant Lab Results:   Additional Information SS#: [id number]  Trish Mage, LCSW

## 2019-08-15 NOTE — TOC Progression Note (Addendum)
Transition of Care [facility]) - Progression Note    Patient Details  Name: Lisa Mclaughlin MRN: [id number] Date of Birth: 07-Mar-1950  Transition of Care [facility]) CM/SW [place],  Phone Number: 08/15/2019, 10:23 AM  Clinical Narrative:  Delana Meyer at [facility] was to see patient this AM.  Got caught up in an emergency.  States she will come by at 5:30 today.  Let husband know. TOC will continue to follow during the course of hospitalization. Sent referral information to [facility] per husband request     Expected Discharge Plan: Skilled Nursing Facility Barriers to Discharge: Other (comment) (Husband and facility working out particulars of self pay)  Expected Discharge Plan and Services Expected Discharge Plan: [place] Choice: [place] arrangements for the past 2 months: [place] Expected Discharge Date: 07/29/19                                     Social Determinants of Health (SDOH) Interventions    Readmission Risk Interventions No flowsheet data found.

## 2019-08-16 DIAGNOSIS — R451 Restlessness and agitation: Secondary | ICD-10-CM | POA: Diagnosis not present

## 2019-08-16 LAB — COMPREHENSIVE METABOLIC PANEL
ALT: 53 U/L — ABNORMAL HIGH (ref 0–44)
AST: 24 U/L (ref 15–41)
Albumin: 3.2 g/dL — ABNORMAL LOW (ref 3.5–5.0)
Alkaline Phosphatase: 69 U/L (ref 38–126)
Anion gap: 8 (ref 5–15)
BUN: 27 mg/dL — ABNORMAL HIGH (ref 8–23)
CO2: 20 mmol/L — ABNORMAL LOW (ref 22–32)
Calcium: 8.5 mg/dL — ABNORMAL LOW (ref 8.9–10.3)
Chloride: 103 mmol/L (ref 98–111)
Creatinine, Ser: 0.75 mg/dL (ref 0.44–1.00)
GFR calc Af Amer: >60 mL/min (ref >60)
GFR calc non Af Amer: >60 mL/min (ref >60)
Glucose, Bld: 97 mg/dL (ref 70–99)
Potassium: 3.3 mmol/L — ABNORMAL LOW (ref 3.5–5.1)
Sodium: 131 mmol/L — ABNORMAL LOW (ref 135–145)
Total Bilirubin: 0.4 mg/dL (ref 0.3–1.2)
Total Protein: 6 g/dL — ABNORMAL LOW (ref 6.5–8.1)

## 2019-08-16 LAB — CBC
HCT: 35.6 % — ABNORMAL LOW (ref 36.0–46.0)
Hemoglobin: 11.4 g/dL — ABNORMAL LOW (ref 12.0–15.0)
MCH: 29.1 pg (ref 26.0–34.0)
MCHC: 32 g/dL (ref 30.0–36.0)
MCV: 90.8 fL (ref 80.0–100.0)
Platelets: 214 10*3/uL (ref 150–400)
RBC: 3.92 MIL/uL (ref 3.87–5.11)
RDW: 13.2 % (ref 11.5–15.5)
WBC: 6.7 10*3/uL (ref 4.0–10.5)
nRBC: 0 % (ref 0.0–0.2)

## 2019-08-16 MED ORDER — POTASSIUM CHLORIDE 20 MEQ/15ML (10%) PO SOLN
20.0000 meq | Freq: Once | ORAL | Status: AC
  Administered 2019-08-16: 20 meq via ORAL
  Filled 2019-08-16: qty 15

## 2019-08-16 NOTE — Progress Notes (Signed)
PROGRESS NOTE    Lisa Mclaughlin  WRU:[id number] DOB: 01-01-1951 DOA: 07/08/2019 PCP: Patient, No Pcp Per   Chief Complaint  Patient presents with   Altered Mental Status   Brief Narrative: 69 yo Patient with past medical history of dementia, seizure disorder and hypertension as well as CAD presents with failure to thrive and behavioral disturbances regarding her dementia. Patienthasa prolonged hospital stay due to inability to find placement appropriate for the patient.Psychiatry has cleared the patient for placement. Currently patient is suffering from poor p.o. intake and resultant hypernatremia. Currently plan iscontinue to engage in goals of care conversation and continue current medication. At this time patient medically stable and scheduled in 1 placement.  Subjective: Had successful lab draw that shows resolved hyponatremia potassium slightly low.  CBC stable.   No acute events overnight. She is able to tell me her name as ' Lisa Mclaughlin' Calm and coopereative, in fetal position  Assessment & Plan:  Alzheimer's dementia advanced with behavioral disturbance/anxiety/depression/agitation: Agitation resolved.  Behavioral disturbance improved.  Seen by psych medication has been adjusted continue current Depakote hydroxyzine Seroquel.  Initial plan was to go to inpatient psych however at this time plan is to ALF   Hypernatremia: 2/2 poor oral intake volume depletion . Patient not allowing to have IV line placed for IV fluids.  Sodium improved at 131 today. Encouraging oral intake  Seizure disorder continue Keppra and Depakote.  GOC: DNR.  Husband is having discussion with hospice about transitioning the patient.  UTI without hematuria completed antibiotic course.  Cultures negative.  History of CAD/essential hypertension: No chest pain.  Continue aspirin, beta-blocker  Protein calorie malnutrition moderate with adult failure to thrive/weight loss, encourage oral intake.  Continue  supplementation.  Hypokalemia will replete orally.  Elevated lfts ALT 53- monitor  DVT prophylaxis: enoxaparin (LOVENOX) injection 40 mg Start: 07/08/19 2200 Code Status:   Code Status: DNR  Family Communication: plan of care discussed with patient at bedside.  Status is: Inpatient  Remains inpatient appropriate because:Unsafe d/c plan.  Awaiting for placement   Dispo:  Patient From: Home  Planned Disposition: ALF  Expected discharge date: 08/13/19  Medically stable for discharge: YES   Nutrition: Diet Order            Diet regular Room service appropriate? Yes with Assist; Fluid consistency: Thin  Diet effective now                       Body mass index is 22.37 kg/m.     Consultants:see note  Procedures:see note Microbiology:see note Blood Culture    Component Value Date/Time   SDES  07/08/2019 1545    Urine Performed at [facility], [place] [address] [place], [place] [address]    SPECREQUEST  07/08/2019 1545    NONE Performed at [facility], [place] [address] [place], [place] [address]    CULT  07/08/2019 1545    NO GROWTH Performed at [facility], [place] [address], [place], [place] [address]    REPTSTATUS 07/09/2019 FINAL 07/08/2019 1545    Other culture-see note  Medications: Scheduled Meds:  aspirin  81 mg Oral Daily   carvedilol  3.125 mg Oral BID WC   cholecalciferol  5,000 Units Oral Daily   divalproex  250 mg Oral Q12H   enoxaparin (LOVENOX) injection  40 mg Subcutaneous Q24H   feeding supplement (ENSURE ENLIVE)  237 mL Oral BID BM   hydrOXYzine  25  mg Oral QHS   levETIRAcetam  1,500 mg Oral BID   megestrol  400 mg Oral Daily   multivitamin with minerals  1 tablet Oral Daily   potassium chloride  20 mEq Oral Once   QUEtiapine  50 mg Oral QHS   Continuous Infusions:  Antimicrobials: Anti-infectives (From admission, onward)   Start     Dose/Rate Route Frequency Ordered Stop    07/09/19 1800  cefTRIAXone (ROCEPHIN) 1 g in sodium chloride 0.9 % 100 mL IVPB        1 g 200 mL/hr over 30 Minutes Intravenous Every 24 hours 07/09/19 0814 07/11/19 1748   07/08/19 1900  cefTRIAXone (ROCEPHIN) 1 g in sodium chloride 0.9 % 100 mL IVPB        1 g 200 mL/hr over 30 Minutes Intravenous  Once 07/08/19 1853 07/08/19 2015       Objective: Vitals: Today's Vitals   08/15/19 1200 08/15/19 1524 08/15/19 1945 08/16/19 0321  BP:  111/76 (!) 105/93 118/70  Pulse:  66 87 66  Resp:  16 15 15   Temp:  98.1 F (36.7 C) 98.4 F (36.9 C) 98.3 F (36.8 C)  TempSrc:  Oral Axillary Oral  SpO2:  98% 99% 100%  Weight:      Height:      PainSc: 0-No pain       Intake/Output Summary (Last 24 hours) at 08/16/2019 0855 Last data filed at 08/15/2019 1024 Gross per 24 hour  Intake 236 ml  Output --  Net 236 ml   Filed Weights   07/21/19 0400 08/14/19 1748  Weight: 58.4 kg 53.7 kg   Weight change:    Intake/Output from previous day: 08/13 0701 - 08/14 0700 In: 236 [P.O.:236] Out: -  Intake/Output this shift: No intake/output data recorded.  Examination: General exam: AAO to self only , NAD, weak appearing. HEENT:Oral mucosa moist, Ear/Nose WNL grossly, dentition normal. Respiratory system: bilaterally clear,no wheezing or crackles,no use of accessory muscle Cardiovascular system: S1 & S2 +, No JVD,. Gastrointestinal system: Abdomen soft, NT,ND, BS+ Nervous System:Alert, awake, moving extremities and grossly nonfocal Extremities: No edema, distal peripheral pulses palpable.  Skin: No rashes,no icterus. MSK: Normal muscle bulk,tone, power  Data Reviewed: I have personally reviewed following labs and imaging studies CBC: Recent Labs  Lab 08/16/19 0319  WBC 6.7  HGB 11.4*  HCT 35.6*  MCV 90.8  PLT 893   Basic Metabolic Panel: Recent Labs  Lab 08/16/19 0319  NA 131*  K 3.3*  CL 103  CO2 20*  GLUCOSE 97  BUN 27*  CREATININE 0.75  CALCIUM 8.5*    GFR: Estimated Creatinine Clearance: 50.1 mL/min (by C-G formula based on SCr of 0.75 mg/dL). Liver Function Tests: Recent Labs  Lab 08/16/19 0319  AST 24  ALT 53*  ALKPHOS 69  BILITOT 0.4  PROT 6.0*  ALBUMIN 3.2*   No results for input(s): LIPASE, AMYLASE in the last 168 hours. No results for input(s): AMMONIA in the last 168 hours. Coagulation Profile: No results for input(s): INR, PROTIME in the last 168 hours. Cardiac Enzymes: No results for input(s): CKTOTAL, CKMB, CKMBINDEX, TROPONINI in the last 168 hours. BNP (last 3 results) No results for input(s): PROBNP in the last 8760 hours. HbA1C: No results for input(s): HGBA1C in the last 72 hours. CBG: No results for input(s): GLUCAP in the last 168 hours. Lipid Profile: No results for input(s): CHOL, HDL, LDLCALC, TRIG, CHOLHDL, LDLDIRECT in the last 72 hours. Thyroid Function Tests: No  results for input(s): TSH, T4TOTAL, FREET4, T3FREE, THYROIDAB in the last 72 hours. Anemia Panel: No results for input(s): VITAMINB12, FOLATE, FERRITIN, TIBC, IRON, RETICCTPCT in the last 72 hours. Sepsis Labs: No results for input(s): PROCALCITON, LATICACIDVEN in the last 168 hours.  No results found for this or any previous visit (from the past 240 hour(s)).    Radiology Studies: No results found.   LOS: 68 days   Antonieta Pert, MD [facility]  08/16/2019, 8:55 AM

## 2019-08-17 DIAGNOSIS — R451 Restlessness and agitation: Secondary | ICD-10-CM | POA: Diagnosis not present

## 2019-08-17 NOTE — Progress Notes (Signed)
PROGRESS NOTE    Lisa Mclaughlin  WER:[id number] DOB: 28-Oct-1950 DOA: 07/08/2019 PCP: Patient, No Pcp Per   Chief Complaint  Patient presents with  . Altered Mental Status   Brief Narrative: 69 yo Patient with past medical history of dementia, seizure disorder and hypertension as well as CAD presents with failure to thrive and behavioral disturbances regarding her dementia. Patienthasa prolonged hospital stay due to inability to find placement appropriate for the patient.Psychiatry has cleared the patient for placement. Currently patient is suffering from poor p.o. intake and resultant hypernatremia. Currently plan iscontinue to engage in goals of care conversation and continue current medication. At this time patient medically stable and scheduled in 1 placement.  Subjective: Patient 50 the patient.  Resting comfortably did not wake her up.  Assessment & Plan:  Alzheimer's dementia advanced with behavioral disturbance/anxiety/depression/agitation: Agitation resolved.  Behavioral disturbance improved.  Seen by psych medication has been adjusted continue current Depakote hydroxyzine Seroquel.  Initial plan was to go to inpatient psych however at this time plan is to ALF   Hypernatremia: 2/2 poor oral intake volume depletion . Patient not allowing to have IV line placed for IV fluids.  Sodium improved at 131 on repeat lab. Encouraging oral intake  Seizure disorder continue Keppra and Depakote.  GOC: DNR.  Husband is having discussion with hospice about transitioning the patient.  UTI without hematuria completed antibiotic course.  Cultures negative.  History of CAD/essential hypertension: No chest pain.  Continue aspirin, beta-blocker  Protein calorie malnutrition moderate with adult failure to thrive/weight loss, encourage oral intake.  Continue supplementation.  Hypokalemia will replete orally.  Elevated lfts ALT 53- monitor  DVT prophylaxis: enoxaparin (LOVENOX) injection 40 mg  Start: 07/08/19 2200 Code Status:   Code Status: DNR  Family Communication: plan of care discussed with patient at bedside.  Status is: Inpatient  Remains inpatient appropriate because:Unsafe d/c plan.  Awaiting for placement   Dispo:  Patient From: Home  Planned Disposition: ALF  Expected discharge date: 08/13/19  Medically stable for discharge: YES   Nutrition: Diet Order            Diet regular Room service appropriate? Yes with Assist; Fluid consistency: Thin  Diet effective now                       Body mass index is 22.37 kg/m.     Consultants:see note  Procedures:see note Microbiology:see note Blood Culture    Component Value Date/Time   SDES  07/08/2019 1545    Urine Performed at [facility], [place] [address], [place], [place] [address]    SPECREQUEST  07/08/2019 1545    NONE Performed at [facility], [place] [address] [place], [place] [address]    CULT  07/08/2019 1545    NO GROWTH Performed at [facility], [place] [address] [place], [place] [address]    REPTSTATUS 07/09/2019 FINAL 07/08/2019 1545    Other culture-see note  Medications: Scheduled Meds: . aspirin  81 mg Oral Daily  . carvedilol  3.125 mg Oral BID WC  . cholecalciferol  5,000 Units Oral Daily  . divalproex  250 mg Oral Q12H  . enoxaparin (LOVENOX) injection  40 mg Subcutaneous Q24H  . feeding supplement (ENSURE ENLIVE)  237 mL Oral BID BM  . hydrOXYzine  25 mg Oral QHS  . levETIRAcetam  1,500 mg Oral BID  . megestrol  400 mg Oral Daily  . multivitamin with  minerals  1 tablet Oral Daily  . QUEtiapine  50 mg Oral QHS   Continuous Infusions:  Antimicrobials: Anti-infectives (From admission, onward)   Start     Dose/Rate Route Frequency Ordered Stop   07/09/19 1800  cefTRIAXone (ROCEPHIN) 1 g in sodium chloride 0.9 % 100 mL IVPB        1 g 200 mL/hr over 30 Minutes Intravenous Every 24 hours 07/09/19 0814 07/11/19 1748   07/08/19  1900  cefTRIAXone (ROCEPHIN) 1 g in sodium chloride 0.9 % 100 mL IVPB        1 g 200 mL/hr over 30 Minutes Intravenous  Once 07/08/19 1853 07/08/19 2015       Objective: Vitals: Today's Vitals   08/16/19 1955 08/16/19 1957 08/17/19 0639 08/17/19 0930  BP:  126/71 124/60   Pulse:  62 (!) 58   Resp:  20 20   Temp:  98.1 F (36.7 C) 98.2 F (36.8 C)   TempSrc:      SpO2:  98% 97%   Weight:      Height:      PainSc: 0-No pain   4     Intake/Output Summary (Last 24 hours) at 08/17/2019 1158 Last data filed at 08/17/2019 1141 Gross per 24 hour  Intake 0 ml  Output --  Net 0 ml   Filed Weights   07/21/19 0400 08/14/19 1748  Weight: 58.4 kg 53.7 kg   Weight change:    Intake/Output from previous day: No intake/output data recorded. Intake/Output this shift: No intake/output data recorded.  Examination: General exam: Sleeping, resting comfortably, not in distress.  On room air.   HEENT:Oral mucosa moist, Ear/Nose WNL grossly, dentition normal. Respiratory system: bilaterally clear, no wheezing or crackles,no use of accessory muscle Cardiovascular system: S1 & S2 +, No JVD,. Gastrointestinal system: Abdomen soft, NT,ND, BS+ Nervous System: withdraws to pain,sleeping  Extremities: No edema, distal peripheral pulses palpable.  Skin: No rashes,no icterus. MSK: Normal muscle bulk,tone, power    Data Reviewed: I have personally reviewed following labs and imaging studies CBC: Recent Labs  Lab 08/16/19 0319  WBC 6.7  HGB 11.4*  HCT 35.6*  MCV 90.8  PLT 626   Basic Metabolic Panel: Recent Labs  Lab 08/16/19 0319  NA 131*  K 3.3*  CL 103  CO2 20*  GLUCOSE 97  BUN 27*  CREATININE 0.75  CALCIUM 8.5*   GFR: Estimated Creatinine Clearance: 50.1 mL/min (by C-G formula based on SCr of 0.75 mg/dL). Liver Function Tests: Recent Labs  Lab 08/16/19 0319  AST 24  ALT 53*  ALKPHOS 69  BILITOT 0.4  PROT 6.0*  ALBUMIN 3.2*   No results for input(s): LIPASE,  AMYLASE in the last 168 hours. No results for input(s): AMMONIA in the last 168 hours. Coagulation Profile: No results for input(s): INR, PROTIME in the last 168 hours. Cardiac Enzymes: No results for input(s): CKTOTAL, CKMB, CKMBINDEX, TROPONINI in the last 168 hours. BNP (last 3 results) No results for input(s): PROBNP in the last 8760 hours. HbA1C: No results for input(s): HGBA1C in the last 72 hours. CBG: No results for input(s): GLUCAP in the last 168 hours. Lipid Profile: No results for input(s): CHOL, HDL, LDLCALC, TRIG, CHOLHDL, LDLDIRECT in the last 72 hours. Thyroid Function Tests: No results for input(s): TSH, T4TOTAL, FREET4, T3FREE, THYROIDAB in the last 72 hours. Anemia Panel: No results for input(s): VITAMINB12, FOLATE, FERRITIN, TIBC, IRON, RETICCTPCT in the last 72 hours. Sepsis Labs: No results for input(s):  PROCALCITON, LATICACIDVEN in the last 168 hours.  No results found for this or any previous visit (from the past 240 hour(s)).    Radiology Studies: No results found.   LOS: 69 days   Antonieta Pert, MD [facility]  08/17/2019, 11:58 AM

## 2019-08-18 DIAGNOSIS — R451 Restlessness and agitation: Secondary | ICD-10-CM | POA: Diagnosis not present

## 2019-08-18 NOTE — Clinical Social Work Note (Signed)
Patient PASSR # is [id number] H. No end date-dementia primary.

## 2019-08-18 NOTE — Progress Notes (Signed)
[facility] ([facility]) Community Based Palliative Care       This patient has been referred to our palliative care services in the community.  [facility] will continue to follow for any discharge planning needs and to coordinate continuation of palliative care.   If you have questions or need assistance, please call [phone] or contact the hospital Liaison listed on AMION.     Thank you for the opportunity to participate in this patient's care.     Chrislyn King, BSN, RN [facility] Hospital Liaison   [phone]  

## 2019-08-18 NOTE — TOC Progression Note (Addendum)
Transition of Care [facility]) - Progression Note    Patient Details  Name: Lisa Mclaughlin MRN: [id number] Date of Birth: June 23, 1950  Transition of Care [facility]) CM/SW [place], [place] Phone Number: 08/18/2019, 3:04 PM  Clinical Narrative:   Delana Meyer from [facility] called to say that she does not have the staffing available to address patient's current needs, which she sees as high, and so they will not be able to take her.  In the meantime, I called Earnest Bailey, who is working with husband as Optometrist, and asked her to follow up on referral I sent to [employer], per her request, on Friday. TOC will continue to follow during the course of hospitalization.  Addendum: Sent referral to [facility]     Expected Discharge Plan: [place] Barriers to Discharge: Other (comment) (Husband and facility working out particulars of self pay)  Expected Discharge Plan and Services Expected Discharge Plan: [place] Choice: [place] arrangements for the past 2 months: [place] Expected Discharge Date: 07/29/19                                     Social Determinants of Health (SDOH) Interventions    Readmission Risk Interventions No flowsheet data found.

## 2019-08-18 NOTE — Progress Notes (Signed)
PROGRESS NOTE    Lisa Mclaughlin  ZHY:[id number] DOB: 20-Dec-1950 DOA: 07/08/2019 PCP: Lisa Mclaughlin, No Pcp Per   Chief Complaint  Lisa Mclaughlin presents with  . Altered Mental Status   Brief Narrative: 69 yo Lisa Mclaughlin with past medical history of dementia, seizure disorder and hypertension as well as CAD presents with failure to thrive and behavioral disturbances regarding her dementia. Patienthasa prolonged hospital stay due to inability to find placement appropriate for the Lisa Mclaughlin.Psychiatry has cleared the Lisa Mclaughlin for placement. Currently Lisa Mclaughlin is suffering from poor p.o. intake and resultant hypernatremia. Currently plan iscontinue to engage in goals of care conversation and continue current medication. At this time Lisa Mclaughlin medically stable and scheduled in 1 placement.  Subjective: On the bedside chair alert awake able to tell me her name after much prompting  Assessment & Plan:  Alzheimer's dementia advanced with behavioral disturbance/anxiety/depression/agitation: Agitation resolved.  Behavioral disturbance improved.  Seen by psych medication has been adjusted continue current Depakote hydroxyzine Seroquel.  Initial plan was to go to inpatient psych however at this time plan is to ALF   Hypernatremia: 2/2 poor oral intake volume depletion . Lisa Mclaughlin not allowing to have IV line placed for IV fluids.  Sodium improved at 131 on repeat lab. Encouraging oral intake  Seizure disorder continue Keppra and Depakote.  GOC: DNR.  Husband is having discussion with hospice about transitioning the Lisa Mclaughlin.  UTI without hematuria completed antibiotic course.  Cultures negative.  History of CAD/essential hypertension: No chest pain.  Continue aspirin, beta-blocker  Protein calorie malnutrition moderate with adult failure to thrive/weight loss, encourage oral intake.  Continue supplementation.  Hypokalemia will replete orally.  Elevated lfts ALT 53- monitor  DVT prophylaxis: enoxaparin (LOVENOX)  injection 40 mg Start: 07/08/19 2200 Code Status:   Code Status: DNR  Family Communication: plan of care discussed with Lisa Mclaughlin at bedside.  Status is: Inpatient  Remains inpatient appropriate because:Unsafe d/c plan.  Awaiting for placement   Dispo:  Lisa Mclaughlin From: Home  Planned Disposition: ALF  Expected discharge date: 08/13/19  Medically stable for discharge: YES   Nutrition: Diet Order            Diet regular Room service appropriate? Yes with Assist; Fluid consistency: Thin  Diet effective now                       Body mass index is 22.37 kg/m.     Consultants:see note  Procedures:see note Microbiology:see note Blood Culture    Component Value Date/Time   SDES  07/08/2019 1545    Urine Performed at [facility], [place] [address], [place], [place] [address]    SPECREQUEST  07/08/2019 1545    NONE Performed at [facility], [place] [address], [place], [place] [address]    CULT  07/08/2019 1545    NO GROWTH Performed at [facility], [place] [address] [place], [place] [address]    REPTSTATUS 07/09/2019 FINAL 07/08/2019 1545    Other culture-see note  Medications: Scheduled Meds: . aspirin  81 mg Oral Daily  . carvedilol  3.125 mg Oral BID WC  . cholecalciferol  5,000 Units Oral Daily  . divalproex  250 mg Oral Q12H  . enoxaparin (LOVENOX) injection  40 mg Subcutaneous Q24H  . feeding supplement (ENSURE ENLIVE)  237 mL Oral BID BM  . hydrOXYzine  25 mg Oral QHS  . levETIRAcetam  1,500 mg Oral BID  . megestrol  400 mg Oral Daily  .  multivitamin with minerals  1 tablet Oral Daily  . QUEtiapine  50 mg Oral QHS   Continuous Infusions:  Antimicrobials: Anti-infectives (From admission, onward)   Start     Dose/Rate Route Frequency Ordered Stop   07/09/19 1800  cefTRIAXone (ROCEPHIN) 1 g in sodium chloride 0.9 % 100 mL IVPB        1 g 200 mL/hr over 30 Minutes Intravenous Every 24 hours 07/09/19 0814 07/11/19  1748   07/08/19 1900  cefTRIAXone (ROCEPHIN) 1 g in sodium chloride 0.9 % 100 mL IVPB        1 g 200 mL/hr over 30 Minutes Intravenous  Once 07/08/19 1853 07/08/19 2015       Objective: Vitals: Today's Vitals   08/17/19 2003 08/17/19 2021 08/18/19 0623 08/18/19 1310  BP: 122/78  105/66 98/72  Pulse: 70  (!) 55 60  Resp: 20  20 15   Temp: 97.8 F (36.6 C)  98.2 F (36.8 C) 98.2 F (36.8 C)  TempSrc:    Oral  SpO2: 98%  99% 100%  Weight:      Height:      PainSc:  1       Intake/Output Summary (Last 24 hours) at 08/18/2019 1313 Last data filed at 08/17/2019 1734 Gross per 24 hour  Intake 383 ml  Output --  Net 383 ml   Filed Weights   07/21/19 0400 08/14/19 1748  Weight: 58.4 kg 53.7 kg   Weight change:    Intake/Output from previous day: 08/15 0701 - 08/16 0700 In: 383 [P.O.:383] Out: -  Intake/Output this shift: No intake/output data recorded.  Examination: General exam: AAO X1, NAD, weak appearing. HEENT:Oral mucosa moist, Ear/Nose WNL grossly, dentition normal. Respiratory system: bilaterally clear,no wheezing or crackles,no use of accessory muscle Cardiovascular system: S1 & S2 +, No JVD,. Gastrointestinal system: Abdomen soft, NT,ND, BS+ Nervous System:Alert, awake, moving extremities and grossly nonfocal Extremities: No edema, distal peripheral pulses palpable.  Skin: No rashes,no icterus. MSK: Normal muscle bulk,tone, power  Data Reviewed: I have personally reviewed following labs and imaging studies CBC: Recent Labs  Lab 08/16/19 0319  WBC 6.7  HGB 11.4*  HCT 35.6*  MCV 90.8  PLT 188   Basic Metabolic Panel: Recent Labs  Lab 08/16/19 0319  NA 131*  K 3.3*  CL 103  CO2 20*  GLUCOSE 97  BUN 27*  CREATININE 0.75  CALCIUM 8.5*   GFR: Estimated Creatinine Clearance: 50.1 mL/min (by C-G formula based on SCr of 0.75 mg/dL). Liver Function Tests: Recent Labs  Lab 08/16/19 0319  AST 24  ALT 53*  ALKPHOS 69  BILITOT 0.4  PROT 6.0*   ALBUMIN 3.2*   No results for input(s): LIPASE, AMYLASE in the last 168 hours. No results for input(s): AMMONIA in the last 168 hours. Coagulation Profile: No results for input(s): INR, PROTIME in the last 168 hours. Cardiac Enzymes: No results for input(s): CKTOTAL, CKMB, CKMBINDEX, TROPONINI in the last 168 hours. BNP (last 3 results) No results for input(s): PROBNP in the last 8760 hours. HbA1C: No results for input(s): HGBA1C in the last 72 hours. CBG: No results for input(s): GLUCAP in the last 168 hours. Lipid Profile: No results for input(s): CHOL, HDL, LDLCALC, TRIG, CHOLHDL, LDLDIRECT in the last 72 hours. Thyroid Function Tests: No results for input(s): TSH, T4TOTAL, FREET4, T3FREE, THYROIDAB in the last 72 hours. Anemia Panel: No results for input(s): VITAMINB12, FOLATE, FERRITIN, TIBC, IRON, RETICCTPCT in the last 72 hours. Sepsis Labs: No  results for input(s): PROCALCITON, LATICACIDVEN in the last 168 hours.  No results found for this or any previous visit (from the past 240 hour(s)).    Radiology Studies: No results found.   LOS: 35 days   Antonieta Pert, MD [facility]  08/18/2019, 1:13 PM

## 2019-08-19 NOTE — TOC Progression Note (Signed)
Transition of Care [facility]) - Progression Note    Patient Details  Name: Lisa Mclaughlin MRN: [id number] Date of Birth: [birth year]  Transition [facility] Care [facility]) CM/SW [place], [place] Phone Number: 08/19/2019, 2:12 PM  Clinical Narrative:   Resent referral information to Janett Billow at [facility] as she said she did not get it last week. TOC will continue to follow during the course of hospitalization.     Expected Discharge Plan: Skilled Nursing Facility Barriers to Discharge: Other (comment) (Husband and facility working out particulars of self pay)  Expected Discharge Plan and Services Expected Discharge Plan: [place] Choice: [place] arrangements for the past 2 months: [place] Expected Discharge Date: 07/29/19                                     Social Determinants of Health (SDOH) Interventions    Readmission Risk Interventions No flowsheet data found.

## 2019-08-19 NOTE — Progress Notes (Signed)
PROGRESS NOTE    Lisa Mclaughlin  OIZ:[id number] DOB: 1950-07-29 DOA: 07/08/2019 PCP: Patient, No Pcp Per   Chief Complaint  Patient presents with  . Altered Mental Status   Brief Narrative: 69 yo Patient with past medical history of dementia, seizure disorder and hypertension as well as CAD presents with failure to thrive and behavioral disturbances regarding her dementia. Patienthasa prolonged hospital stay due to inability to find placement appropriate for the patient.Psychiatry has cleared the patient for placement. Currently patient is suffering from poor p.o. intake and resultant hypernatremia. Currently plan iscontinue to engage in goals of care conversation and continue current medication. At this time patient medically stable and scheduled in 1 placement.  Subjective: Resting on bed not in acute distress able to tell me her name.  Nursing at bedside.  Assessment & Plan:  Alzheimer's dementia advanced with behavioral disturbance/anxiety/depression/agitation: Agitation resolved.  Behavioral disturbance improved.  Seen by psych medication has been adjusted continue current Depakote hydroxyzine Seroquel.  Initial plan was to go to inpatient psych however at this time plan is to ALF.  Cont fall precautions bed alarm.  Hypernatremia: 2/2 poor oral intake volume depletion . Patient not allowing to have IV line placed for IV fluids.  Sodium improved at 131 on repeat lab. Encouraging oral intake  Seizure disorder continue Keppra and Depakote.  GOC: DNR.  Husband is having discussion with hospice about transitioning the patient.  UTI without hematuria completed antibiotic course.  Cultures negative.  History of CAD/essential hypertension: No chest pain.  Continue aspirin, beta-blocker  Protein calorie malnutrition moderate with adult failure to thrive/weight loss, encourage oral intake.  Continue supplementation.  Hypokalemia will replete orally.  Elevated lfts ALT 53- monitor  DVT  prophylaxis: enoxaparin (LOVENOX) injection 40 mg Start: 07/08/19 2200 Code Status:   Code Status: DNR  Family Communication: plan of care discussed with patient at bedside.  Status is: Inpatient  Remains inpatient appropriate because:Unsafe d/c plan.  Awaiting for placement   Dispo:  Patient From: Home  Planned Disposition: ALF  Expected discharge date: Once bed available  Medically stable for discharge: YES   Nutrition: Diet Order            Diet regular Room service appropriate? Yes with Assist; Fluid consistency: Thin  Diet effective now                       Body mass index is 22.37 kg/m.     Consultants:see note  Procedures:see note Microbiology:see note Blood Culture    Component Value Date/Time   SDES  07/08/2019 1545    Urine Performed at [facility], [place] [address], [place], [place] [address]    SPECREQUEST  07/08/2019 1545    NONE Performed at [facility], [place] [address] [place], [place] [address]    CULT  07/08/2019 1545    NO GROWTH Performed at [facility], [place] [address], [place], [place] [address]    REPTSTATUS 07/09/2019 FINAL 07/08/2019 1545    Other culture-see note  Medications: Scheduled Meds: . aspirin  81 mg Oral Daily  . carvedilol  3.125 mg Oral BID WC  . cholecalciferol  5,000 Units Oral Daily  . divalproex  250 mg Oral Q12H  . enoxaparin (LOVENOX) injection  40 mg Subcutaneous Q24H  . feeding supplement (ENSURE ENLIVE)  237 mL Oral BID BM  . hydrOXYzine  25 mg Oral QHS  . levETIRAcetam  1,500 mg Oral BID  .  megestrol  400 mg Oral Daily  . multivitamin with minerals  1 tablet Oral Daily  . QUEtiapine  50 mg Oral QHS   Continuous Infusions:  Antimicrobials: Anti-infectives (From admission, onward)   Start     Dose/Rate Route Frequency Ordered Stop   07/09/19 1800  cefTRIAXone (ROCEPHIN) 1 g in sodium chloride 0.9 % 100 mL IVPB        1 g 200 mL/hr over 30 Minutes  Intravenous Every 24 hours 07/09/19 0814 07/11/19 1748   07/08/19 1900  cefTRIAXone (ROCEPHIN) 1 g in sodium chloride 0.9 % 100 mL IVPB        1 g 200 mL/hr over 30 Minutes Intravenous  Once 07/08/19 1853 07/08/19 2015       Objective: Vitals: Today's Vitals   08/18/19 1310 08/18/19 1956 08/18/19 2000 08/19/19 0312  BP: 98/72 133/76  132/74  Pulse: 60 69  71  Resp: 15 15  15   Temp: (!) 97.5 F (36.4 C) 98.1 F (36.7 C)  97.8 F (36.6 C)  TempSrc: Axillary Oral  Oral  SpO2: 100% 100%  99%  Weight:      Height:      PainSc:   0-No pain     Intake/Output Summary (Last 24 hours) at 08/19/2019 1010 Last data filed at 08/18/2019 1736 Gross per 24 hour  Intake 60 ml  Output --  Net 60 ml   Filed Weights   07/21/19 0400 08/14/19 1748  Weight: 58.4 kg 53.7 kg   Weight change:    Intake/Output from previous day: 08/16 0701 - 08/17 0700 In: 60 [P.O.:60] Out: -  Intake/Output this shift: No intake/output data recorded.  Examination: General exam: AAOx2,NAD, weak appearing. HEENT:Oral mucosa moist, Ear/Nose WNL grossly, dentition normal. Respiratory system: bilaterally clear,no wheezing or crackles,no use of accessory muscle Cardiovascular system: S1 & S2 +, No JVD,. Gastrointestinal system: Abdomen soft, NT,ND, BS+ Nervous System:Alert, awake, moving extremities and grossly nonfocal Extremities: No edema, distal peripheral pulses palpable.  Skin: No rashes,no icterus. MSK: Normal muscle bulk,tone, power.  Data Reviewed: I have personally reviewed following labs and imaging studies CBC: Recent Labs  Lab 08/16/19 0319  WBC 6.7  HGB 11.4*  HCT 35.6*  MCV 90.8  PLT 009   Basic Metabolic Panel: Recent Labs  Lab 08/16/19 0319  NA 131*  K 3.3*  CL 103  CO2 20*  GLUCOSE 97  BUN 27*  CREATININE 0.75  CALCIUM 8.5*   GFR: Estimated Creatinine Clearance: 50.1 mL/min (by C-G formula based on SCr of 0.75 mg/dL). Liver Function Tests: Recent Labs  Lab  08/16/19 0319  AST 24  ALT 53*  ALKPHOS 69  BILITOT 0.4  PROT 6.0*  ALBUMIN 3.2*   No results for input(s): LIPASE, AMYLASE in the last 168 hours. No results for input(s): AMMONIA in the last 168 hours. Coagulation Profile: No results for input(s): INR, PROTIME in the last 168 hours. Cardiac Enzymes: No results for input(s): CKTOTAL, CKMB, CKMBINDEX, TROPONINI in the last 168 hours. BNP (last 3 results) No results for input(s): PROBNP in the last 8760 hours. HbA1C: No results for input(s): HGBA1C in the last 72 hours. CBG: No results for input(s): GLUCAP in the last 168 hours. Lipid Profile: No results for input(s): CHOL, HDL, LDLCALC, TRIG, CHOLHDL, LDLDIRECT in the last 72 hours. Thyroid Function Tests: No results for input(s): TSH, T4TOTAL, FREET4, T3FREE, THYROIDAB in the last 72 hours. Anemia Panel: No results for input(s): VITAMINB12, FOLATE, FERRITIN, TIBC, IRON, RETICCTPCT in the  last 72 hours. Sepsis Labs: No results for input(s): PROCALCITON, LATICACIDVEN in the last 168 hours.  No results found for this or any previous visit (from the past 240 hour(s)).    Radiology Studies: No results found.   LOS: 40 days   Antonieta Pert, MD [facility]  08/19/2019, 10:10 AM

## 2019-08-20 NOTE — TOC Progression Note (Addendum)
Transition of Care [facility]) - Progression Note    Patient Details  Name: Lisa Mclaughlin MRN: [id number] Date of Birth: 1950/10/05  Transition of Care [facility]) CM/SW [place], [place] Phone Number: 08/20/2019, 10:46 AM  Clinical Narrative:   Left message for Janett Billow at [facility].  Spoke with Melissa at [facility], [phone], who will call husband to talk to him about sharing a room with another resident. TOC will continue to follow during the course of hospitalization.  Jessica with [facility] will come tomorrow at 9 to assess.  Melissa from [facility] will come around noon to assess.     Expected Discharge Plan: Skilled Nursing Facility Barriers to Discharge: Other (comment) (Husband and facility working out particulars of self pay)  Expected Discharge Plan and Services Expected Discharge Plan: [place] Choice: [place] arrangements for the past 2 months: [place] Expected Discharge Date: 07/29/19                                     Social Determinants of Health (SDOH) Interventions    Readmission Risk Interventions No flowsheet data found.

## 2019-08-20 NOTE — Progress Notes (Signed)
PROGRESS NOTE    Lisa Mclaughlin  RDE:[id number] DOB: March 23, 1950 DOA: 07/08/2019 PCP: Patient, No Pcp Per   Chief Complaint  Patient presents with  . Altered Mental Status   Brief Narrative: 69 yo Patient with past medical history of dementia, seizure disorder and hypertension as well as CAD presents with failure to thrive and behavioral disturbances regarding her dementia. Patienthasa prolonged hospital stay due to inability to find placement appropriate for the patient.Psychiatry has cleared the patient for placement. Currently patient is suffering from poor p.o. intake and resultant hypernatremia. Currently plan iscontinue to engage in goals of care conversation and continue current medication. At this time patient medically stable and scheduled in 1 placement.  Subjective: Alert awake not much conversant today.  Did not tell me her name and was staring at me to be started mumbling words follows some commands Assessment & Plan:  Alzheimer's dementia advanced with behavioral disturbance/anxiety/depression/agitation: Agitation resolved.  Behavioral disturbance improved.  Seen by psych medication has been adjusted continue current Depakote hydroxyzine Seroquel.  Initial plan was to go to inpatient psych however at this time plan is to ALF.  Cont fall precautions bed alarm.  Hypernatremia: 2/2 poor oral intake volume depletion . Patient not allowing to have IV line placed for IV fluids.  Sodium improved at 131 on repeat lab. Encouraging oral intake  Seizure disorder continue Keppra and Depakote.  GOC: DNR.  Husband is having discussion with hospice about transitioning the patient.  UTI without hematuria completed antibiotic course.  Cultures negative.  History of CAD/essential hypertension: No chest pain.  Continue aspirin, beta-blocker  Protein calorie malnutrition moderate with adult failure to thrive/weight loss, encourage oral intake.  Continue supplementation.  Hypokalemia will replete  orally.  Elevated lfts ALT 53- monitor  DVT prophylaxis: enoxaparin (LOVENOX) injection 40 mg Start: 07/08/19 2200 Code Status:   Code Status: DNR  Family Communication: plan of care was discussed with patient's husband at the bedside previously.  Status is: Inpatient  Remains inpatient appropriate because:Unsafe d/c plan.  Awaiting for placement   Dispo:  Patient From:home   Planned Disposition: ALF    Expected discharge date: Once bed available  Medically stable for discharge: YES   Nutrition: Diet Order            Diet regular Room service appropriate? Yes with Assist; Fluid consistency: Thin  Diet effective now                       Body mass index is 22.37 kg/m.     Consultants:see note  Procedures:see note Microbiology:see note Blood Culture    Component Value Date/Time   SDES  07/08/2019 1545    Urine Performed at [facility], [place] [address] [place], [place] [address]    SPECREQUEST  07/08/2019 1545    NONE Performed at [facility], [place] [address], [place], [place] [address]    CULT  07/08/2019 1545    NO GROWTH Performed at [facility], [place] [address], [place], [place] [address]    REPTSTATUS 07/09/2019 FINAL 07/08/2019 1545    Other culture-see note  Medications: Scheduled Meds: . aspirin  81 mg Oral Daily  . carvedilol  3.125 mg Oral BID WC  . cholecalciferol  5,000 Units Oral Daily  . divalproex  250 mg Oral Q12H  . enoxaparin (LOVENOX) injection  40 mg Subcutaneous Q24H  . feeding supplement (ENSURE ENLIVE)  237 mL Oral BID BM  .  hydrOXYzine  25 mg Oral QHS  . levETIRAcetam  1,500 mg Oral BID  . megestrol  400 mg Oral Daily  . multivitamin with minerals  1 tablet Oral Daily  . QUEtiapine  50 mg Oral QHS   Continuous Infusions:  Antimicrobials: Anti-infectives (From admission, onward)   Start     Dose/Rate Route Frequency Ordered Stop   07/09/19 1800  cefTRIAXone (ROCEPHIN) 1 g in  sodium chloride 0.9 % 100 mL IVPB        1 g 200 mL/hr over 30 Minutes Intravenous Every 24 hours 07/09/19 0814 07/11/19 1748   07/08/19 1900  cefTRIAXone (ROCEPHIN) 1 g in sodium chloride 0.9 % 100 mL IVPB        1 g 200 mL/hr over 30 Minutes Intravenous  Once 07/08/19 1853 07/08/19 2015       Objective: Vitals: Today's Vitals   08/19/19 2300 08/20/19 0638 08/20/19 0932 08/20/19 0935  BP:  133/66 101/61   Pulse:  72 62   Resp:  18    Temp:  97.6 F (36.4 C)    TempSrc:  Oral    SpO2:  (!) 74% 98%   Weight:      Height:      PainSc: 0-No pain   0-No pain    Intake/Output Summary (Last 24 hours) at 08/20/2019 1208 Last data filed at 08/19/2019 1818 Gross per 24 hour  Intake 472 ml  Output --  Net 472 ml   Filed Weights   07/21/19 0400 08/14/19 1748  Weight: 58.4 kg 53.7 kg   Weight change:    Intake/Output from previous day: 08/17 0701 - 08/18 0700 In: 708 [P.O.:708] Out: -  Intake/Output this shift: No intake/output data recorded.  Examination: General exam: AAOx1-2 , NAD, weak appearing. HEENT:Oral mucosa moist, Ear/Nose WNL grossly, dentition normal. Respiratory system: bilaterally bilaterally clear,no wheezing or crackles,no use of accessory muscle Cardiovascular system: S1 & S2 +, No JVD,. Gastrointestinal system: Abdomen soft, NT,ND, BS+ Nervous System:Alert, awake, moving extremities and grossly nonfocal Extremities: No edema, distal peripheral pulses palpable.  Skin: No rashes,no icterus. MSK: Normal muscle bulk,tone, power  Data Reviewed: I have personally reviewed following labs and imaging studies CBC: Recent Labs  Lab 08/16/19 0319  WBC 6.7  HGB 11.4*  HCT 35.6*  MCV 90.8  PLT 397   Basic Metabolic Panel: Recent Labs  Lab 08/16/19 0319  NA 131*  K 3.3*  CL 103  CO2 20*  GLUCOSE 97  BUN 27*  CREATININE 0.75  CALCIUM 8.5*   GFR: Estimated Creatinine Clearance: 50.1 mL/min (by C-G formula based on SCr of 0.75 mg/dL). Liver  Function Tests: Recent Labs  Lab 08/16/19 0319  AST 24  ALT 53*  ALKPHOS 69  BILITOT 0.4  PROT 6.0*  ALBUMIN 3.2*   No results for input(s): LIPASE, AMYLASE in the last 168 hours. No results for input(s): AMMONIA in the last 168 hours. Coagulation Profile: No results for input(s): INR, PROTIME in the last 168 hours. Cardiac Enzymes: No results for input(s): CKTOTAL, CKMB, CKMBINDEX, TROPONINI in the last 168 hours. BNP (last 3 results) No results for input(s): PROBNP in the last 8760 hours. HbA1C: No results for input(s): HGBA1C in the last 72 hours. CBG: No results for input(s): GLUCAP in the last 168 hours. Lipid Profile: No results for input(s): CHOL, HDL, LDLCALC, TRIG, CHOLHDL, LDLDIRECT in the last 72 hours. Thyroid Function Tests: No results for input(s): TSH, T4TOTAL, FREET4, T3FREE, THYROIDAB in the last 72 hours.  Anemia Panel: No results for input(s): VITAMINB12, FOLATE, FERRITIN, TIBC, IRON, RETICCTPCT in the last 72 hours. Sepsis Labs: No results for input(s): PROCALCITON, LATICACIDVEN in the last 168 hours.  No results found for this or any previous visit (from the past 240 hour(s)).    Radiology Studies: No results found.   LOS: 41 days   Antonieta Pert, MD [facility]  08/20/2019, 12:08 PM

## 2019-08-21 NOTE — Progress Notes (Signed)
PROGRESS NOTE    Lisa Mclaughlin  UQJ:[id number] DOB: 03/01/1950 DOA: 07/08/2019 PCP: Lisa Mclaughlin, No Pcp Per   Chief Complaint  Lisa Mclaughlin presents with  . Altered Mental Status   Brief Narrative: 69 yo Lisa Mclaughlin with past medical history of dementia, seizure disorder and hypertension as well as CAD presents with failure to thrive and behavioral disturbances regarding her dementia. Patienthasa prolonged hospital stay due to inability to find placement appropriate for the Lisa Mclaughlin.Psychiatry has cleared the Lisa Mclaughlin for placement. Currently Lisa Mclaughlin is suffering from poor p.o. intake and resultant hypernatremia. Currently plan iscontinue to engage in goals of care conversation and continue current medication. At this time Lisa Mclaughlin medically stable and scheduled in 1 placement.  Subjective: Alert awake, husband at the bedside, no new complaints or distress.  Not communicative and interactive.  Assessment & Plan:  Alzheimer's dementia advanced with behavioral disturbance/anxiety/depression/agitation: Agitation resolved.  Behavioral disturbance improved.  Seen by psych medication has been adjusted continue current Depakote hydroxyzine Seroquel.  Initial plan was to go to inpatient psych however at this time plan is to ALF.  Cont fall precautions bed alarm.  Hypernatremia: 2/2 poor oral intake volume depletion . Lisa Mclaughlin not allowing to have IV line placed for IV fluids.  Sodium improved at 131 on repeat lab. Encouraging oral intake  Seizure disorder continue Keppra and Depakote.  GOC: DNR.  Husband is having discussion with hospice about transitioning the Lisa Mclaughlin.  UTI without hematuria completed antibiotic course.  Cultures negative.  History of CAD/essential hypertension: No chest pain.  Continue aspirin, beta-blocker  Protein calorie malnutrition moderate with adult failure to thrive/weight loss, encourage oral intake.  Continue supplementation.  Hypokalemia will replete orally.  Elevated lfts ALT  53- monitor  DVT prophylaxis: enoxaparin (LOVENOX) injection 40 mg Start: 07/08/19 2200 Code Status:   Code Status: DNR  Family Communication: plan of care was discussed with Lisa Mclaughlin's husband at the bedside previously.  Status is: Inpatient  Remains inpatient appropriate because:Unsafe d/c plan.  Awaiting for placement  Dispo:  Lisa Mclaughlin From:home   Planned Disposition: ALF    Expected discharge date: Once bed available case manager from the facility is coming to review with the Lisa Mclaughlin today  Medically stable for discharge: YES   Nutrition: Diet Order            Diet regular Room service appropriate? Yes with Assist; Fluid consistency: Thin  Diet effective now                       Body mass index is 22.37 kg/m.     Consultants:see note  Procedures:see note Microbiology:see note Blood Culture    Component Value Date/Time   SDES  07/08/2019 1545    Urine Performed at [facility], [place] [address] [place], [place] [address]    SPECREQUEST  07/08/2019 1545    NONE Performed at [facility], [place] [address] [place], [place] [address]    CULT  07/08/2019 1545    NO GROWTH Performed at [facility], [place] [address] [place], [place] [address]    REPTSTATUS 07/09/2019 FINAL 07/08/2019 1545    Other culture-see note  Medications: Scheduled Meds: . aspirin  81 mg Oral Daily  . carvedilol  3.125 mg Oral BID WC  . cholecalciferol  5,000 Units Oral Daily  . divalproex  250 mg Oral Q12H  . enoxaparin (LOVENOX) injection  40 mg Subcutaneous Q24H  . feeding supplement (ENSURE ENLIVE)  237 mL Oral BID  BM  . hydrOXYzine  25 mg Oral QHS  . levETIRAcetam  1,500 mg Oral BID  . megestrol  400 mg Oral Daily  . multivitamin with minerals  1 tablet Oral Daily  . QUEtiapine  50 mg Oral QHS   Continuous Infusions:  Antimicrobials: Anti-infectives (From admission, onward)   Start     Dose/Rate Route Frequency Ordered Stop    07/09/19 1800  cefTRIAXone (ROCEPHIN) 1 g in sodium chloride 0.9 % 100 mL IVPB        1 g 200 mL/hr over 30 Minutes Intravenous Every 24 hours 07/09/19 0814 07/11/19 1748   07/08/19 1900  cefTRIAXone (ROCEPHIN) 1 g in sodium chloride 0.9 % 100 mL IVPB        1 g 200 mL/hr over 30 Minutes Intravenous  Once 07/08/19 1853 07/08/19 2015       Objective: Vitals: Today's Vitals   08/20/19 1929 08/20/19 2000 08/21/19 0319 08/21/19 0800  BP: (!) 123/50  (!) 144/72   Pulse: 69  78   Resp: 15  16   Temp: (!) 97.5 F (36.4 C)  (!) 97.5 F (36.4 C)   TempSrc: Oral  Oral   SpO2: 100%  100%   Weight:      Height:      PainSc:  0-No pain  0-No pain    Intake/Output Summary (Last 24 hours) at 08/21/2019 1257 Last data filed at 08/20/2019 1656 Gross per 24 hour  Intake --  Output 400 ml  Net -400 ml   Filed Weights   07/21/19 0400 08/14/19 1748  Weight: 58.4 kg 53.7 kg   Weight change:    Intake/Output from previous day: 08/18 0701 - 08/19 0700 In: 118 [P.O.:118] Out: 400 [Urine:400] Intake/Output this shift: No intake/output data recorded.  Examination: General exam: AAOx , NAD, weak appearing. HEENT:Oral mucosa moist, Ear/Nose WNL grossly, dentition normal. Respiratory system: bilaterally clear,no wheezing or crackles,no use of accessory muscle Cardiovascular system: S1 & S2 +, No JVD,. Gastrointestinal system: Abdomen soft, NT,ND, BS+ Nervous System:Alert, awake, moving extremities and grossly nonfocal Extremities: No edema, distal peripheral pulses palpable.  Skin: No rashes,no icterus. MSK: Normal muscle bulk,tone, power  Data Reviewed: I have personally reviewed following labs and imaging studies CBC: Recent Labs  Lab 08/16/19 0319  WBC 6.7  HGB 11.4*  HCT 35.6*  MCV 90.8  PLT 665   Basic Metabolic Panel: Recent Labs  Lab 08/16/19 0319  NA 131*  K 3.3*  CL 103  CO2 20*  GLUCOSE 97  BUN 27*  CREATININE 0.75  CALCIUM 8.5*   GFR: Estimated Creatinine  Clearance: 50.1 mL/min (by C-G formula based on SCr of 0.75 mg/dL). Liver Function Tests: Recent Labs  Lab 08/16/19 0319  AST 24  ALT 53*  ALKPHOS 69  BILITOT 0.4  PROT 6.0*  ALBUMIN 3.2*   No results for input(s): LIPASE, AMYLASE in the last 168 hours. No results for input(s): AMMONIA in the last 168 hours. Coagulation Profile: No results for input(s): INR, PROTIME in the last 168 hours. Cardiac Enzymes: No results for input(s): CKTOTAL, CKMB, CKMBINDEX, TROPONINI in the last 168 hours. BNP (last 3 results) No results for input(s): PROBNP in the last 8760 hours. HbA1C: No results for input(s): HGBA1C in the last 72 hours. CBG: No results for input(s): GLUCAP in the last 168 hours. Lipid Profile: No results for input(s): CHOL, HDL, LDLCALC, TRIG, CHOLHDL, LDLDIRECT in the last 72 hours. Thyroid Function Tests: No results for input(s): TSH, T4TOTAL,  FREET4, T3FREE, THYROIDAB in the last 72 hours. Anemia Panel: No results for input(s): VITAMINB12, FOLATE, FERRITIN, TIBC, IRON, RETICCTPCT in the last 72 hours. Sepsis Labs: No results for input(s): PROCALCITON, LATICACIDVEN in the last 168 hours.  No results found for this or any previous visit (from the past 240 hour(s)).    Radiology Studies: No results found.   LOS: 62 days   Antonieta Pert, MD [facility]  08/21/2019, 12:57 PM

## 2019-08-22 LAB — BASIC METABOLIC PANEL
Anion gap: 10 (ref 5–15)
BUN: 17 mg/dL (ref 8–23)
CO2: 19 mmol/L — ABNORMAL LOW (ref 22–32)
Calcium: 9.3 mg/dL (ref 8.9–10.3)
Chloride: 111 mmol/L (ref 98–111)
Creatinine, Ser: 0.77 mg/dL (ref 0.44–1.00)
GFR calc Af Amer: >60 mL/min (ref >60)
GFR calc non Af Amer: >60 mL/min (ref >60)
Glucose, Bld: 96 mg/dL (ref 70–99)
Potassium: 4.1 mmol/L (ref 3.5–5.1)
Sodium: 140 mmol/L (ref 135–145)

## 2019-08-22 LAB — CBC
HCT: 38.4 % (ref 36.0–46.0)
Hemoglobin: 13.1 g/dL (ref 12.0–15.0)
MCH: 30.5 pg (ref 26.0–34.0)
MCHC: 34.1 g/dL (ref 30.0–36.0)
MCV: 89.3 fL (ref 80.0–100.0)
Platelets: 273 10*3/uL (ref 150–400)
RBC: 4.3 MIL/uL (ref 3.87–5.11)
RDW: 13.9 % (ref 11.5–15.5)
WBC: 7.8 10*3/uL (ref 4.0–10.5)
nRBC: 0 % (ref 0.0–0.2)

## 2019-08-22 NOTE — TOC Progression Note (Signed)
Transition of Care [facility]) - Progression Note    Patient Details  Name: Lisa Mclaughlin MRN: [id number] Date of Birth: 1950/11/15  Transition of Care [facility]) CM/SW [place], [place] Phone Number: 08/22/2019, 8:04 AM  Clinical Narrative:   [facility] declined patient, citing past behavioral issues, medication adjustment issues and not having a single room.  They may be an option down the road if they get a single room.  [facility] is reviewing application.  [place] did not come out yesterday as planned. TOC will continue to follow during the course of hospitalization.     Expected Discharge Plan: Skilled Nursing Facility Barriers to Discharge: Other (comment) (Husband and facility working out particulars of self pay)  Expected Discharge Plan and Services Expected Discharge Plan: [place] Choice: [place] arrangements for the past 2 months: [place] Expected Discharge Date: 07/29/19                                     Social Determinants of Health (SDOH) Interventions    Readmission Risk Interventions No flowsheet data found.

## 2019-08-22 NOTE — Progress Notes (Signed)
Manufacturing engineer [facility]) Community Based Palliative Care  This patient has been referred to our palliative care services in the community. [facility] will continue to follow for any discharge planning needs and to coordinate continuation of palliative care.  If you have questions or need assistance, please call [phone] or contact the hospital Liaison listed on AMION.  Thank you for the opportunity to participate in this patient's care.  Farrel Gordon, RN, CCM  [facility] Liaison (listed on [place] under [facility])    [phone]

## 2019-08-22 NOTE — Progress Notes (Signed)
PROGRESS NOTE    Lisa Mclaughlin  FVC:[id number] DOB: 1950-01-17 DOA: 07/08/2019 PCP: Patient, No Pcp Per   Chief Complaint  Patient presents with  . Altered Mental Status   Brief Narrative: 69 yo Patient with past medical history of dementia, seizure disorder and hypertension as well as CAD presents with failure to thrive and behavioral disturbances regarding her dementia. Patienthasa prolonged hospital stay due to inability to find placement appropriate for the patient.Psychiatry has cleared the patient for placement. Currently patient is suffering from poor p.o. intake and resultant hypernatremia. Currently plan iscontinue to engage in goals of care conversation and continue current medication. At this time patient medically stable and scheduled in 1 placement.  Subjective:  Laying comfortably in fetal position,on the bed side, does not interact with me today eyes are open.  Assessment & Plan:  Alzheimer's dementia advanced with behavioral disturbance/anxiety/depression/agitation: Agitation resolved.  Behavioral disturbance improved.  Seen by psych medication has been adjusted continue current Depakote hydroxyzine Seroquel.  Initial plan was to go to inpatient psych however at this time plan is to ALF.  Cont fall precautions bed alarm.  Hypernatremia: 2/2 poor oral intake volume depletion .  Repeat labs showed improved sodium level.  Encourage oral intake  Seizure disorder continue Keppra and Depakote.  GOC: DNR.  Husband is having discussion with hospice about transitioning the patient.  UTI without hematuria completed antibiotic course.  Cultures negative.  History of CAD/essential hypertension: No chest pain.  Continue aspirin, beta-blocker  Protein calorie malnutrition moderate with adult failure to thrive/weight loss, encourage oral intake.  Continue supplementation.  Hypokalemia resolved.  Elevated lfts ALT 53- monitor  DVT prophylaxis: enoxaparin (LOVENOX) injection 40 mg  Start: 07/08/19 2200 Code Status:   Code Status: DNR  Family Communication: plan of care was discussed with patient's husband at the bedside 8/19  Status is: Inpatient  Remains inpatient appropriate because:Unsafe d/c plan.  Awaiting for placement  Dispo:  Patient From:home   Planned Disposition: ALF    Expected discharge date: Once bed available.  Medically stable for discharge: YES   Nutrition: Diet Order            Diet regular Room service appropriate? Yes with Assist; Fluid consistency: Thin  Diet effective now                       Body mass index is 22.37 kg/m.     Consultants:see note  Procedures:see note Microbiology:see note Blood Culture    Component Value Date/Time   SDES  07/08/2019 1545    Urine Performed at [facility], [place] [address] [place], [place] [address]    SPECREQUEST  07/08/2019 1545    NONE Performed at [facility], [place] [address] [place], [place] [address]    CULT  07/08/2019 1545    NO GROWTH Performed at [facility], [place] [address], [place], [place] [address]    REPTSTATUS 07/09/2019 FINAL 07/08/2019 1545    Other culture-see note  Medications: Scheduled Meds: . aspirin  81 mg Oral Daily  . carvedilol  3.125 mg Oral BID WC  . cholecalciferol  5,000 Units Oral Daily  . divalproex  250 mg Oral Q12H  . enoxaparin (LOVENOX) injection  40 mg Subcutaneous Q24H  . feeding supplement (ENSURE ENLIVE)  237 mL Oral BID BM  . hydrOXYzine  25 mg Oral QHS  . levETIRAcetam  1,500 mg Oral BID  . megestrol  400 mg Oral  Daily  . multivitamin with minerals  1 tablet Oral Daily  . QUEtiapine  50 mg Oral QHS   Continuous Infusions:  Antimicrobials: Anti-infectives (From admission, onward)   Start     Dose/Rate Route Frequency Ordered Stop   07/09/19 1800  cefTRIAXone (ROCEPHIN) 1 g in sodium chloride 0.9 % 100 mL IVPB        1 g 200 mL/hr over 30 Minutes Intravenous Every 24 hours  07/09/19 0814 07/11/19 1748   07/08/19 1900  cefTRIAXone (ROCEPHIN) 1 g in sodium chloride 0.9 % 100 mL IVPB        1 g 200 mL/hr over 30 Minutes Intravenous  Once 07/08/19 1853 07/08/19 2015       Objective: Vitals: Today's Vitals   08/21/19 1318 08/21/19 2100 08/21/19 2105 08/22/19 0500  BP: (!) 98/47  127/60 122/69  Pulse: 64  70 82  Resp: 16  18 18   Temp: 76.2 F (36.9 C)  98 F (36.7 C) 97.9 F (36.6 C)  TempSrc: Oral     SpO2: 100%  90% 91%  Weight:      Height:      PainSc:  0-No pain      Intake/Output Summary (Last 24 hours) at 08/22/2019 0802 Last data filed at 08/21/2019 2100 Gross per 24 hour  Intake 120 ml  Output 200 ml  Net -80 ml   Filed Weights   07/21/19 0400 08/14/19 1748  Weight: 58.4 kg 53.7 kg   Weight change:    Intake/Output from previous day: 08/19 0701 - 08/20 0700 In: 120 [P.O.:120] Out: 200 [Urine:200] Intake/Output this shift: No intake/output data recorded.  Examination: General exam: AAOx1 , NAD, weak appearing. HEENT:Oral mucosa moist, Ear/Nose WNL grossly, dentition normal. Respiratory system: bilaterally clear,no wheezing or crackles,no use of accessory muscle Cardiovascular system: S1 & S2 +, No JVD,. Gastrointestinal system: Abdomen soft, NT,ND, BS+ Nervous System:Alert, awake, moving extremities and grossly nonfocal Extremities: No edema, distal peripheral pulses palpable.  Skin: No rashes,no icterus. MSK: Normal muscle bulk,tone, power   Data Reviewed: I have personally reviewed following labs and imaging studies CBC: Recent Labs  Lab 08/16/19 0319 08/22/19 0304  WBC 6.7 7.8  HGB 11.4* 13.1  HCT 35.6* 38.4  MCV 90.8 89.3  PLT 214 831   Basic Metabolic Panel: Recent Labs  Lab 08/16/19 0319 08/22/19 0304  NA 131* 140  K 3.3* 4.1  CL 103 111  CO2 20* 19*  GLUCOSE 97 96  BUN 27* 17  CREATININE 0.75 0.77  CALCIUM 8.5* 9.3   GFR: Estimated Creatinine Clearance: 50.1 mL/min (by C-G formula based on SCr  of 0.77 mg/dL). Liver Function Tests: Recent Labs  Lab 08/16/19 0319  AST 24  ALT 53*  ALKPHOS 69  BILITOT 0.4  PROT 6.0*  ALBUMIN 3.2*   No results for input(s): LIPASE, AMYLASE in the last 168 hours. No results for input(s): AMMONIA in the last 168 hours. Coagulation Profile: No results for input(s): INR, PROTIME in the last 168 hours. Cardiac Enzymes: No results for input(s): CKTOTAL, CKMB, CKMBINDEX, TROPONINI in the last 168 hours. BNP (last 3 results) No results for input(s): PROBNP in the last 8760 hours. HbA1C: No results for input(s): HGBA1C in the last 72 hours. CBG: No results for input(s): GLUCAP in the last 168 hours. Lipid Profile: No results for input(s): CHOL, HDL, LDLCALC, TRIG, CHOLHDL, LDLDIRECT in the last 72 hours. Thyroid Function Tests: No results for input(s): TSH, T4TOTAL, FREET4, T3FREE, THYROIDAB in the last  72 hours. Anemia Panel: No results for input(s): VITAMINB12, FOLATE, FERRITIN, TIBC, IRON, RETICCTPCT in the last 72 hours. Sepsis Labs: No results for input(s): PROCALCITON, LATICACIDVEN in the last 168 hours.  No results found for this or any previous visit (from the past 240 hour(s)).    Radiology Studies: No results found.   LOS: 37 days   Antonieta Pert, MD [facility]  08/22/2019, 8:02 AM

## 2019-08-23 NOTE — Progress Notes (Signed)
CSW reviewed chart and sees pt is being referred to:  [facility] and [facility].  Per note, both are being considered for placement.  Please reconsult if future social work needs arise.  CSW signing off, as social work intervention is no longer needed.  Alphonse Guild. Rasheen Schewe  MSW, LCSW, LCAS, CCS Transitions of Care Clinical Social Worker Care Coordination Department Ph: [phone]

## 2019-08-23 NOTE — Progress Notes (Addendum)
PROGRESS NOTE    Lisa Mclaughlin  MVE:[id number] DOB: 1950-11-28 DOA: 07/08/2019 PCP: Patient, No Pcp Per   Brief Narrative:  70 yo Patient with past medical history of dementia, seizure disorder and hypertension as well as CAD presents with failure to thrive and behavioral disturbances regarding her dementia. Patienthasa prolonged hospital stay due to inability to find placement appropriate for the patient. Psychiatry has cleared the patient for placement. Currently patient is suffering from poor p.o. intake and resultant hypernatremia. Currently plan iscontinue to engage in goals of care conversation and continue current medication. At this time patient medically stable and scheduled in 1:1 placement.    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  Behavioral disturbance improved.  Seen by psych,  medication has been adjusted.  continue current Depakote,  Hydroxyzine,  Seroquel.  Initial plan was to go to inpatient psych however at this time plan is to ALF.  Cont fall precautions and bed alarm.  Hypernatremia: 2/2 poor oral intake volume depletion .  Repeat labs showed improved sodium level.  Encourage oral intake  Seizure disorder continue Keppra and Depakote.  GOC: DNR.  Husband is having discussion with hospice about transitioning the patient.  UTI:  without hematuria completed antibiotic course.  Cultures negative.  History of CAD/essential hypertension: No chest pain.  Continue aspirin, beta-blocker  Protein calorie malnutrition moderate with adult failure to thrive/weight  loss, encourage oral intake.  Continue supplementation.  Hypokalemia resolved.  Elevated lfts ALT 53- monitor   DVT prophylaxis: Lovenox Code Status: DNR Family Communication: plan of care was discussed with patient's husband at the bedside 8/19 by previous hospitalist. Disposition Plan:   Dispo:             Patient From:home              Planned Disposition: ALF               Expected discharge date: Once bed available.             Medically stable for discharge: YES   Consultants:    Behavioral health.  Procedures:  Antimicrobials:  Anti-infectives (From admission, onward)   Start     Dose/Rate Route Frequency Ordered Stop   07/09/19 1800  cefTRIAXone (ROCEPHIN) 1 g in sodium chloride 0.9 % 100 mL IVPB        1 g 200 mL/hr over 30 Minutes Intravenous Every 24 hours 07/09/19 0814 07/11/19 1748   07/08/19 1900  cefTRIAXone (ROCEPHIN) 1 g in sodium chloride 0.9 % 100 mL IVPB        1 g 200 mL/hr over 30 Minutes Intravenous  Once 07/08/19 1853 07/08/19 2015      Subjective: Patient was seen and examined at bedside. No overnight events. She is lying comfortably on the bed, does not interact, her eyes were open.  Objective: Vitals:   08/22/19 1809 08/22/19 2016 08/23/19 0407 08/23/19 1352  BP: 123/65 111/86 125/79 (!) 94/56  Pulse: 83 66 (!) 58 70  Resp: 18 16 15 14   Temp: 98.5 F (36.9 C) 97.6 F (36.4 C) 97.9 F (36.6 C) 99.2 F (37.3  C)  TempSrc: Axillary Oral Oral Oral  SpO2: 98% 100% 99% 97%  Weight:      Height:        Intake/Output Summary (Last 24 hours) at 08/23/2019 1357 Last data filed at 08/23/2019 0703 Gross per 24 hour  Intake --  Output 350 ml  Net -350 ml   Filed Weights   07/21/19 0400 08/14/19 1748  Weight: 58.4 kg 53.7 kg    Examination:  General exam: Appears calm and comfortable  Respiratory system: Clear to auscultation. Respiratory effort normal. Cardiovascular system: S1 & S2 heard, RRR. No JVD, murmurs, rubs, gallops or  clicks. No pedal edema. Gastrointestinal system: Abdomen is nondistended, soft and nontender. No organomegaly or masses felt. Normal bowel sounds heard. Central nervous system: Alert and oriented. No focal neurological deficits. Extremities: Symmetric 5 x 5 power. Skin: No rashes, lesions or ulcers Psychiatry: Judgement and insight appear normal. Mood & affect appropriate.     Data Reviewed: I have personally reviewed following labs and imaging studies  CBC: Recent Labs  Lab 08/22/19 0304  WBC 7.8  HGB 13.1  HCT 38.4  MCV 89.3  PLT 765   Basic Metabolic Panel: Recent Labs  Lab 08/22/19 0304  NA 140  K 4.1  CL 111  CO2 19*  GLUCOSE 96  BUN 17  CREATININE 0.77  CALCIUM 9.3   GFR: Estimated Creatinine Clearance: 50.1 mL/min (by C-G formula based on SCr of 0.77 mg/dL). Liver Function Tests: No results for input(s): AST, ALT, ALKPHOS, BILITOT, PROT, ALBUMIN in the last 168 hours. No results for input(s): LIPASE, AMYLASE in the last 168 hours. No results for input(s): AMMONIA in the last 168 hours. Coagulation Profile: No results for input(s): INR, PROTIME in the last 168 hours. Cardiac Enzymes: No results for input(s): CKTOTAL, CKMB, CKMBINDEX, TROPONINI in the last 168 hours. BNP (last 3 results) No results for input(s): PROBNP in the last 8760 hours. HbA1C: No results for input(s): HGBA1C in the last 72 hours. CBG: No results for input(s): GLUCAP in the last 168 hours. Lipid Profile: No results for input(s): CHOL, HDL, LDLCALC, TRIG, CHOLHDL, LDLDIRECT in the last 72 hours. Thyroid Function Tests: No results for input(s): TSH, T4TOTAL, FREET4, T3FREE, THYROIDAB in the last 72 hours. Anemia Panel: No results for input(s): VITAMINB12, FOLATE, FERRITIN, TIBC, IRON, RETICCTPCT in the last 72 hours. Sepsis Labs: No results for input(s): PROCALCITON, LATICACIDVEN in the last 168 hours.  No results found for this or any previous visit (from the past 240 hour(s)).    Radiology Studies: No results found.  Scheduled Meds: . aspirin  81 mg Oral Daily  . carvedilol  3.125 mg Oral BID WC  . cholecalciferol  5,000 Units Oral Daily  . divalproex  250 mg Oral Q12H  . enoxaparin (LOVENOX) injection  40 mg Subcutaneous Q24H  . feeding supplement (ENSURE ENLIVE)  237 mL Oral BID BM  . hydrOXYzine  25 mg Oral QHS  . levETIRAcetam  1,500 mg Oral BID  . megestrol  400 mg Oral Daily  . multivitamin with minerals  1 tablet Oral Daily  . QUEtiapine  50 mg Oral QHS   Continuous Infusions:   LOS: 44 days    Time spent: 25 mins.    Shawna Clamp, MD [facility]   If 7PM-7AM, please contact night-coverage

## 2019-08-24 LAB — CBC
HCT: 38.8 % (ref 36.0–46.0)
Hemoglobin: 12.3 g/dL (ref 12.0–15.0)
MCH: 29.9 pg (ref 26.0–34.0)
MCHC: 31.7 g/dL (ref 30.0–36.0)
MCV: 94.2 fL (ref 80.0–100.0)
Platelets: 267 10*3/uL (ref 150–400)
RBC: 4.12 MIL/uL (ref 3.87–5.11)
RDW: 13.9 % (ref 11.5–15.5)
WBC: 5.8 10*3/uL (ref 4.0–10.5)
nRBC: 0 % (ref 0.0–0.2)

## 2019-08-24 LAB — BASIC METABOLIC PANEL
Anion gap: 9 (ref 5–15)
BUN: 17 mg/dL (ref 8–23)
CO2: 20 mmol/L — ABNORMAL LOW (ref 22–32)
Calcium: 9.1 mg/dL (ref 8.9–10.3)
Chloride: 112 mmol/L — ABNORMAL HIGH (ref 98–111)
Creatinine, Ser: 0.66 mg/dL (ref 0.44–1.00)
GFR calc Af Amer: >60 mL/min (ref >60)
GFR calc non Af Amer: >60 mL/min (ref >60)
Glucose, Bld: 94 mg/dL (ref 70–99)
Potassium: 3.5 mmol/L (ref 3.5–5.1)
Sodium: 141 mmol/L (ref 135–145)

## 2019-08-24 LAB — PHOSPHORUS: Phosphorus: 3.7 mg/dL (ref 2.5–4.6)

## 2019-08-24 LAB — MAGNESIUM: Magnesium: 2.4 mg/dL (ref 1.7–2.4)

## 2019-08-24 NOTE — Progress Notes (Signed)
PROGRESS NOTE    Lisa Mclaughlin  IRJ:[id number] DOB: 02-Jun-1950 DOA: 07/08/2019 PCP: Patient, No Pcp Per   Brief Narrative:  69 yo Patient with past medical history of dementia, seizure disorder and hypertension as well as CAD presents with failure to thrive and behavioral disturbances regarding her dementia. Patienthasa prolonged hospital stay due to inability to find placement appropriate for the patient. Psychiatry has cleared the patient for placement. Currently patient is suffering from poor p.o. intake and resultant hypernatremia. Currently plan iscontinue to engage in goals of care conversation and continue current medication. At this time patient medically stable and scheduled in 1:1 placement.    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  Behavioral disturbance improved. Seen by psych,  medication has been adjusted.  continue current Depakote,  Hydroxyzine,  Seroquel.  Initial plan was to go to inpatient psych however at this time plan is to ALF.  Cont fall precautions and bed alarm.  Hypernatremia: Improved. 2/2 poor oral intake volume depletion .  Repeat labs showed improved sodium level.  Encourage oral intake  Seizure disorder continue Keppra and Depakote.  GOC: DNR.  Husband is having discussion with hospice about transitioning the patient.  UTI:  without hematuria,  completed antibiotic course.  Cultures negative.  History of CAD/essential hypertension: No chest pain.  Continue aspirin, beta-blocker  Protein calorie malnutrition moderate with adult failure to  thrive/weight loss, encourage oral intake.  Continue supplementation.  Hypokalemia resolved.  Elevated lfts ALT 53- monitor   DVT prophylaxis: Lovenox Code Status: DNR Family Communication: plan of care was discussed with patient's husband at the bedside 8/19 by previous hospitalist. Disposition Plan:   Dispo:             Patient From:home              Planned Disposition: ALF               Expected discharge date: Once bed available.             Medically stable for discharge: YES   Consultants:    Behavioral health.  Procedures:  Antimicrobials:  Anti-infectives (From admission, onward)   Start     Dose/Rate Route Frequency Ordered Stop   07/09/19 1800  cefTRIAXone (ROCEPHIN) 1 g in sodium chloride 0.9 % 100 mL IVPB        1 g 200 mL/hr over 30 Minutes Intravenous Every 24 hours 07/09/19 0814 07/11/19 1748   07/08/19 1900  cefTRIAXone (ROCEPHIN) 1 g in sodium chloride 0.9 % 100 mL IVPB        1 g 200 mL/hr over 30 Minutes Intravenous  Once 07/08/19 1853 07/08/19 2015      Subjective: Patient was seen and examined at bedside. No overnight events. She is sitting comfortably, having breakfast, not interacting   Objective: Vitals:   08/23/19 0407 08/23/19 1352 08/23/19 1948 08/24/19 0615  BP: 125/79 (!) 94/56 129/65 127/71  Pulse: (!) 58 70 78 67  Resp: 15 14 18 14   Temp: 97.9 F (36.6 C) 99.2 F (37.3 C) 97.6 F (36.4 C) 98.1 F (36.7 C)  TempSrc: Oral  Oral Oral Oral  SpO2: 99% 97% 99% 98%  Weight:      Height:       No intake or output data in the 24 hours ending 08/24/19 0824 Filed Weights   07/21/19 0400 08/14/19 1748  Weight: 58.4 kg 53.7 kg    Examination:  General exam: Appears calm and comfortable  Respiratory system: Clear to auscultation. Respiratory effort normal. Cardiovascular system: S1 & S2 heard, RRR. No JVD, murmurs, rubs, gallops or clicks. No pedal edema. Gastrointestinal system: Abdomen is nondistended, soft and nontender. No  organomegaly or masses felt. Normal bowel sounds heard. Central nervous system: Alert and oriented. No focal neurological deficits. Extremities: Symmetric 5 x 5 power. Skin: No rashes, lesions or ulcers Psychiatry: Judgement and insight appear normal. Mood & affect appropriate.     Data Reviewed: I have personally reviewed following labs and imaging studies  CBC: Recent Labs  Lab 08/22/19 0304 08/24/19 0318  WBC 7.8 5.8  HGB 13.1 12.3  HCT 38.4 38.8  MCV 89.3 94.2  PLT 273 144   Basic Metabolic Panel: Recent Labs  Lab 08/22/19 0304 08/24/19 0318  NA 140 141  K 4.1 3.5  CL 111 112*  CO2 19* 20*  GLUCOSE 96 94  BUN 17 17  CREATININE 0.77 0.66  CALCIUM 9.3 9.1  MG  --  2.4  PHOS  --  3.7   GFR: Estimated Creatinine Clearance: 50.1 mL/min (by C-G formula based on SCr of 0.66 mg/dL). Liver Function Tests: No results for input(s): AST, ALT, ALKPHOS, BILITOT, PROT, ALBUMIN in the last 168 hours. No results for input(s): LIPASE, AMYLASE in the last 168 hours. No results for input(s): AMMONIA in the last 168 hours. Coagulation Profile: No results for input(s): INR, PROTIME in the last 168 hours. Cardiac Enzymes: No results for input(s): CKTOTAL, CKMB, CKMBINDEX, TROPONINI in the last 168 hours. BNP (last 3 results) No results for input(s): PROBNP in the last 8760 hours. HbA1C: No results for input(s): HGBA1C in the last 72 hours. CBG: No results for input(s): GLUCAP in the last 168 hours. Lipid Profile: No results for input(s): CHOL, HDL, LDLCALC, TRIG, CHOLHDL, LDLDIRECT in the last 72 hours. Thyroid Function Tests: No results for input(s): TSH, T4TOTAL, FREET4, T3FREE, THYROIDAB in the last 72 hours. Anemia Panel: No results for input(s): VITAMINB12, FOLATE, FERRITIN, TIBC, IRON, RETICCTPCT in the last 72 hours. Sepsis Labs: No results for input(s): PROCALCITON, LATICACIDVEN in the last 168 hours.  No results found for this or any previous visit (from the past  240 hour(s)).   Radiology Studies: No results found.  Scheduled Meds: . aspirin  81 mg Oral Daily  . carvedilol  3.125 mg Oral BID WC  . cholecalciferol  5,000 Units Oral Daily  . divalproex  250 mg Oral Q12H  . enoxaparin (LOVENOX) injection  40 mg Subcutaneous Q24H  . feeding supplement (ENSURE ENLIVE)  237 mL Oral BID BM  . hydrOXYzine  25 mg Oral QHS  . levETIRAcetam  1,500 mg Oral BID  . megestrol  400 mg Oral Daily  . multivitamin with minerals  1 tablet Oral Daily  . QUEtiapine  50 mg Oral QHS   Continuous Infusions:   LOS: 45 days    Time spent: 25 mins.    Shawna Clamp, MD [facility]   If 7PM-7AM, please contact night-coverage

## 2019-08-25 LAB — BASIC METABOLIC PANEL
Anion gap: 10 (ref 5–15)
BUN: 20 mg/dL (ref 8–23)
CO2: 20 mmol/L — ABNORMAL LOW (ref 22–32)
Calcium: 9.1 mg/dL (ref 8.9–10.3)
Chloride: 111 mmol/L (ref 98–111)
Creatinine, Ser: 0.71 mg/dL (ref 0.44–1.00)
GFR calc Af Amer: >60 mL/min (ref >60)
GFR calc non Af Amer: >60 mL/min (ref >60)
Glucose, Bld: 95 mg/dL (ref 70–99)
Potassium: 4.1 mmol/L (ref 3.5–5.1)
Sodium: 141 mmol/L (ref 135–145)

## 2019-08-25 NOTE — TOC Progression Note (Signed)
Transition of Care [facility]) - Progression Note    Patient Details  Name: Lisa Mclaughlin MRN: [id number] Date of Birth: 16-Mar-1950  Transition of Care [facility]) CM/SW [place], [place] Phone Number: 08/25/2019, 12:33 PM  Clinical Narrative:   Janett Billow with [facility] states they are still interested in patient.  They are calling husband today to try to nail down finances. TOC will continue to follow during the course of hospitalization.     Expected Discharge Plan: Skilled Nursing Facility Barriers to Discharge: Other (comment) (Husband and facility working out particulars of self pay)  Expected Discharge Plan and Services Expected Discharge Plan: [place] Choice: [place] arrangements for the past 2 months: [place] Expected Discharge Date: 07/29/19                                     Social Determinants of Health (SDOH) Interventions    Readmission Risk Interventions No flowsheet data found.

## 2019-08-25 NOTE — Progress Notes (Signed)
PROGRESS NOTE    Lisa Mclaughlin  CBJ:[id number] DOB: November 16, 1950 DOA: 07/08/2019 PCP: Patient, No Pcp Per   Brief Narrative:  69 yo Patient with past medical history of dementia, seizure disorder and hypertension as well as CAD presents with failure to thrive and behavioral disturbances regarding her dementia. Patienthasa prolonged hospital stay due to inability to find placement appropriate for the patient. Psychiatry has cleared the patient for placement. Currently patient is suffering from poor p.o. intake and resultant hypernatremia. Currently plan iscontinue to engage in goals of care conversation and continue current medication. At this time patient medically stable and scheduled in 1:1 placement.    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  Behavioral disturbance improved. Seen by psych,  medication has been adjusted.  continue current Depakote,  Hydroxyzine,  Seroquel.  Initial plan was to go to inpatient psych however at this time plan is to ALF.  Cont fall precautions and bed alarm.  Hypernatremia: Improved. 2/2 poor oral intake volume depletion .  Repeat labs showed improved sodium level.  Encourage oral intake  Seizure disorder continue Keppra and Depakote.  GOC: DNR.  Husband is having discussion with hospice about transitioning the patient.  UTI:  without hematuria,  completed antibiotic course.  Cultures negative.  History of CAD/essential hypertension: No chest pain.  Continue aspirin, beta-blocker  Protein calorie malnutrition moderate with adult failure to  thrive/weight loss, encourage oral intake.  Continue supplementation.  Hypokalemia resolved.  Elevated lfts ALT 53- monitor   DVT prophylaxis: Lovenox Code Status: DNR Family Communication: plan of care was discussed with patient's husband at the bedside 8/19 by previous hospitalist. Disposition Plan:   Dispo:             Patient From:home              Planned Disposition: ALF               Expected discharge date: Once bed available.             Medically stable for discharge: YES   Consultants:    Behavioral health.  Procedures:  Antimicrobials:  Anti-infectives (From admission, onward)   Start     Dose/Rate Route Frequency Ordered Stop   07/09/19 1800  cefTRIAXone (ROCEPHIN) 1 g in sodium chloride 0.9 % 100 mL IVPB        1 g 200 mL/hr over 30 Minutes Intravenous Every 24 hours 07/09/19 0814 07/11/19 1748   07/08/19 1900  cefTRIAXone (ROCEPHIN) 1 g in sodium chloride 0.9 % 100 mL IVPB        1 g 200 mL/hr over 30 Minutes Intravenous  Once 07/08/19 1853 07/08/19 2015      Subjective: Patient was seen and examined at bedside. No overnight events. She is lying comfortably in bed, not interacting well.  Objective: Vitals:   08/24/19 0615 08/24/19 1330 08/24/19 2021 08/25/19 0518  BP: 127/71 117/63 (!) 144/71 (!) 111/57  Pulse: 67 64 81 (!) 57  Resp: 14 20 19 19   Temp: 98.1 F (36.7 C) 98.2 F (36.8 C) 98.5 F (36.9 C) 98.4 F (36.9 C)  TempSrc:  Oral Oral    SpO2: 98% 98% 100%   Weight:      Height:       No intake or output data in the 24 hours ending 08/25/19 1255 Filed Weights   07/21/19 0400 08/14/19 1748  Weight: 58.4 kg 53.7 kg    Examination:  General exam: Appears calm and comfortable  Respiratory system: Clear to auscultation. Respiratory effort normal. Cardiovascular system: S1 & S2 heard, RRR. No JVD, murmurs, rubs, gallops or clicks. No pedal edema. Gastrointestinal system: Abdomen is nondistended, soft and nontender. No organomegaly or  masses felt. Normal bowel sounds heard. Central nervous system: Alert and oriented. No focal neurological deficits. Extremities: Symmetric 5 x 5 power. Skin: No rashes, lesions or ulcers Psychiatry: Judgement and insight appear normal. Mood & affect appropriate.     Data Reviewed: I have personally reviewed following labs and imaging studies  CBC: Recent Labs  Lab 08/22/19 0304 08/24/19 0318  WBC 7.8 5.8  HGB 13.1 12.3  HCT 38.4 38.8  MCV 89.3 94.2  PLT 273 854   Basic Metabolic Panel: Recent Labs  Lab 08/22/19 0304 08/24/19 0318 08/25/19 0417  NA 140 141 141  K 4.1 3.5 4.1  CL 111 112* 111  CO2 19* 20* 20*  GLUCOSE 96 94 95  BUN 17 17 20   CREATININE 0.77 0.66 0.71  CALCIUM 9.3 9.1 9.1  MG  --  2.4  --   PHOS  --  3.7  --    GFR: Estimated Creatinine Clearance: 50.1 mL/min (by C-G formula based on SCr of 0.71 mg/dL). Liver Function Tests: No results for input(s): AST, ALT, ALKPHOS, BILITOT, PROT, ALBUMIN in the last 168 hours. No results for input(s): LIPASE, AMYLASE in the last 168 hours. No results for input(s): AMMONIA in the last 168 hours. Coagulation Profile: No results for input(s): INR, PROTIME in the last 168 hours. Cardiac Enzymes: No results for input(s): CKTOTAL, CKMB, CKMBINDEX, TROPONINI in the last 168 hours. BNP (last 3 results) No results for input(s): PROBNP in the last 8760 hours. HbA1C: No results for input(s): HGBA1C in the last 72 hours. CBG: No results for input(s): GLUCAP in the last 168 hours. Lipid Profile: No results for input(s): CHOL, HDL, LDLCALC, TRIG, CHOLHDL, LDLDIRECT in the last 72 hours. Thyroid Function Tests: No results for input(s): TSH, T4TOTAL, FREET4, T3FREE, THYROIDAB in the last 72 hours. Anemia Panel: No results for input(s): VITAMINB12, FOLATE, FERRITIN, TIBC, IRON, RETICCTPCT in the last 72 hours. Sepsis Labs: No results for input(s): PROCALCITON, LATICACIDVEN in the last 168 hours.  No results found for  this or any previous visit (from the past 240 hour(s)).   Radiology Studies: No results found.  Scheduled Meds: . aspirin  81 mg Oral Daily  . carvedilol  3.125 mg Oral BID WC  . cholecalciferol  5,000 Units Oral Daily  . divalproex  250 mg Oral Q12H  . enoxaparin (LOVENOX) injection  40 mg Subcutaneous Q24H  . feeding supplement (ENSURE ENLIVE)  237 mL Oral BID BM  . hydrOXYzine  25 mg Oral QHS  . levETIRAcetam  1,500 mg Oral BID  . megestrol  400 mg Oral Daily  . multivitamin with minerals  1 tablet Oral Daily  . QUEtiapine  50 mg Oral QHS   Continuous Infusions:   LOS: 46 days    Time spent: 25 mins.    Shawna Clamp, MD [facility]   If 7PM-7AM, please contact night-coverage

## 2019-08-25 NOTE — Progress Notes (Signed)
[facility] ([facility]) Community Based Palliative Care       This patient has been referred to our palliative care services in the community.  [facility] will continue to follow for any discharge planning needs and to coordinate continuation of palliative care.   If you have questions or need assistance, please call [phone] or contact the hospital Liaison listed on AMION.     Thank you for the opportunity to participate in this patient's care.     Chrislyn King, BSN, RN [facility] Hospital Liaison   [phone]  

## 2019-08-26 NOTE — Progress Notes (Signed)
PROGRESS NOTE    Brittany Osier  NUU:[id number] DOB: 02-02-1950 DOA: 07/08/2019 PCP: Patient, No Pcp Per   Brief Narrative:  69 yo Patient with past medical history of dementia, seizure disorder and hypertension as well as CAD presents with failure to thrive and behavioral disturbances regarding her dementia. Patienthasa prolonged hospital stay due to inability to find placement appropriate for the patient. Psychiatry has cleared the patient for placement. Currently patient is suffering from poor p.o. intake and resultant hypernatremia. Currently plan iscontinue to engage in goals of care conversation and continue current medication. At this time patient medically stable and scheduled in 1:1 placement.  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  Behavioral disturbance improved. Seen by psych,  medication has been adjusted.  continue current Depakote,  Hydroxyzine,  Seroquel.  Initial plan was to go to inpatient psych however at this time plan is to ALF.  Cont fall precautions and bed alarm.  Hypernatremia: Improved. 2/2 poor oral intake/ volume depletion .  Repeat labs showed improved sodium level.  Encourage oral intake  Seizure disorder continue Keppra and Depakote.  GOC: DNR.  Husband is having discussion with hospice about transitioning the patient.  UTI:  without hematuria,  completed antibiotic course.  Cultures negative.  History of CAD/essential hypertension: No chest pain.  Continue aspirin, beta-blocker  Protein calorie malnutrition moderate with adult failure to  thrive/weight loss, encourage oral intake.  Continue supplementation.  Hypokalemia resolved.  Elevated lfts ALT 53- monitor   DVT prophylaxis: Lovenox Code Status: DNR Family Communication: plan of care was discussed with patient's husband at the bedside. Disposition Plan:   Dispo:             Patient From:home              Planned Disposition: ALF               Expected discharge date: Once bed available.             Medically stable for discharge: YES   Consultants:    Behavioral health.  Procedures:  Antimicrobials:  Anti-infectives (From admission, onward)   Start     Dose/Rate Route Frequency Ordered Stop   07/09/19 1800  cefTRIAXone (ROCEPHIN) 1 g in sodium chloride 0.9 % 100 mL IVPB        1 g 200 mL/hr over 30 Minutes Intravenous Every 24 hours 07/09/19 0814 07/11/19 1748   07/08/19 1900  cefTRIAXone (ROCEPHIN) 1 g in sodium chloride 0.9 % 100 mL IVPB        1 g 200 mL/hr over 30 Minutes Intravenous  Once 07/08/19 1853 07/08/19 2015      Subjective: Patient was seen and examined at bedside. No overnight events. She is lying comfortably in bed, not interacting well. RN reports no issues.  Objective: Vitals:   08/25/19 1349 08/25/19 2111 08/26/19 0533 08/26/19 1335  BP: 110/81 92/68 116/65 (!) 93/56  Pulse: (!) 53 72 (!) 55 65  Resp: 16 18 18 16   Temp: 97.9 F (36.6 C) 98.6 F (37 C) 98.7 F (37.1 C) (!) 97.5 F (36.4 C)  TempSrc: Oral  Oral  SpO2:  98% 96% 99%  Weight:      Height:       No intake or output data in the 24 hours ending 08/26/19 1426 Filed Weights   07/21/19 0400 08/14/19 1748  Weight: 58.4 kg 53.7 kg    Examination:  General exam: Appears calm and comfortable  Respiratory system: Clear to auscultation. Respiratory effort normal. Cardiovascular system: S1 & S2 heard, RRR. No JVD, murmurs, rubs, gallops or clicks. No pedal edema. Gastrointestinal system: Abdomen is nondistended, soft and nontender. No organomegaly or masses  felt. Normal bowel sounds heard. Central nervous system: Alert and oriented. No focal neurological deficits. Extremities: No edema, no cyanosis. No tenderness. Skin: No rashes, lesions or ulcers Psychiatry: Judgement and insight appear normal. Mood & affect appropriate.     Data Reviewed: I have personally reviewed following labs and imaging studies  CBC: Recent Labs  Lab 08/22/19 0304 08/24/19 0318  WBC 7.8 5.8  HGB 13.1 12.3  HCT 38.4 38.8  MCV 89.3 94.2  PLT 273 465   Basic Metabolic Panel: Recent Labs  Lab 08/22/19 0304 08/24/19 0318 08/25/19 0417  NA 140 141 141  K 4.1 3.5 4.1  CL 111 112* 111  CO2 19* 20* 20*  GLUCOSE 96 94 95  BUN 17 17 20   CREATININE 0.77 0.66 0.71  CALCIUM 9.3 9.1 9.1  MG  --  2.4  --   PHOS  --  3.7  --    GFR: Estimated Creatinine Clearance: 50.1 mL/min (by C-G formula based on SCr of 0.71 mg/dL). Liver Function Tests: No results for input(s): AST, ALT, ALKPHOS, BILITOT, PROT, ALBUMIN in the last 168 hours. No results for input(s): LIPASE, AMYLASE in the last 168 hours. No results for input(s): AMMONIA in the last 168 hours. Coagulation Profile: No results for input(s): INR, PROTIME in the last 168 hours. Cardiac Enzymes: No results for input(s): CKTOTAL, CKMB, CKMBINDEX, TROPONINI in the last 168 hours. BNP (last 3 results) No results for input(s): PROBNP in the last 8760 hours. HbA1C: No results for input(s): HGBA1C in the last 72 hours. CBG: No results for input(s): GLUCAP in the last 168 hours. Lipid Profile: No results for input(s): CHOL, HDL, LDLCALC, TRIG, CHOLHDL, LDLDIRECT in the last 72 hours. Thyroid Function Tests: No results for input(s): TSH, T4TOTAL, FREET4, T3FREE, THYROIDAB in the last 72 hours. Anemia Panel: No results for input(s): VITAMINB12, FOLATE, FERRITIN, TIBC, IRON, RETICCTPCT in the last 72 hours. Sepsis Labs: No results for input(s): PROCALCITON, LATICACIDVEN in the last 168 hours.  No results found  for this or any previous visit (from the past 240 hour(s)).   Radiology Studies: No results found.  Scheduled Meds: . aspirin  81 mg Oral Daily  . carvedilol  3.125 mg Oral BID WC  . cholecalciferol  5,000 Units Oral Daily  . divalproex  250 mg Oral Q12H  . enoxaparin (LOVENOX) injection  40 mg Subcutaneous Q24H  . feeding supplement (ENSURE ENLIVE)  237 mL Oral BID BM  . hydrOXYzine  25 mg Oral QHS  . levETIRAcetam  1,500 mg Oral BID  . megestrol  400 mg Oral Daily  . multivitamin with minerals  1 tablet Oral Daily  . QUEtiapine  50 mg Oral QHS   Continuous Infusions:   LOS: 47 days    Time spent: 25 mins.    Shawna Clamp, MD [facility]   If 7PM-7AM, please contact night-coverage

## 2019-08-27 DIAGNOSIS — J9601 Acute respiratory failure with hypoxia: Secondary | ICD-10-CM

## 2019-08-27 LAB — SARS CORONAVIRUS 2 (TAT 6-24 HRS): SARS Coronavirus 2: NEGATIVE

## 2019-08-27 NOTE — Progress Notes (Signed)
[facility]                                                                              Lisa Mclaughlin Demographics  Lisa Mclaughlin, is a 69 y.o. female, DOB - Mar 11, 1950, ERX:[id number]  Admit date - 07/08/2019   Admitting Physician Etta Quill, DO  Outpatient Primary MD for the Lisa Mclaughlin is Lisa Mclaughlin, No Pcp Per  Outpatient specialists:   LOS - 48  days   Medical records reviewed and are as summarized below:    Chief Complaint  Lisa Mclaughlin presents with  . Altered Mental Status       Brief summary   69 yo Lisa Mclaughlin with past medical history of dementia, seizure disorder and hypertension as well as CAD presents with failure to thrive and behavioral disturbances regarding her dementia. Patienthasa prolonged hospital stay due to inability to find placement appropriate for the Lisa Mclaughlin. Psychiatry has cleared the Lisa Mclaughlin for placement. Currently Lisa Mclaughlin is suffering from poor p.o. intake and resultant hypernatremia. Currently plan iscontinue to engage in goals of care conversation and continue current medication. At this time Lisa Mclaughlin medically stable and scheduled in 1:1 placement.  Assessment & Plan       Alzheimer's dementia advanced with behavioral disturbance /anxiety/ depression/ agitation: -Agitation resolved, currently pleasantly confused.  Seen by psychiatry, medications has been adjusted -Continue Depakote, hydroxyzine, Seroquel. -Currently awaiting placement -Continue fall precautions  Hypernatremia -Sodium 153, secondary to poor oral intake and volume depletion  -has improved, currently stable at 141, tolerating diet without any difficulty Encourage p.o. diet   Seizure disorder -Stable, continue Keppra and Depakote.  UTI -No hematuria, completed antibiotic course, cultures negative  History of CAD, hypertension -Currently stable, no chest pain, continue aspirin, beta-blocker  Goals of care  -DNR. Husband is having discussion with hospice about  transitioning the Lisa Mclaughlin.   Hypokalemia Resolved  Mildly elevated LFTs ALT 53, continue to monitor  Protein calorie malnutrition moderate, adult failure to thrive/weight loss, Encourage oral intake, nutritional supplements Estimated body mass index is 22.37 kg/m as calculated from the following:   Height as of this encounter: 5\' 1"  (1.549 m).   Weight as of this encounter: 53.7 kg.  Code Status: DNR DVT Prophylaxis:  Lovenox Family Communication: Discussed all imaging results, lab results, explained to the Lisa Mclaughlin    Disposition Plan:     Status is: Inpatient  Remains inpatient appropriate because:Unsafe d/c plan   Dispo:  Lisa Mclaughlin From: Home  Planned Disposition:  ALF  Expected discharge date:  awaiting bed placement   Medically stable for discharge:  yes        Time Spent in minutes   75mins    Procedures:  None   Consultants:   *psych   Antimicrobials:   Anti-infectives (From admission, onward)   Start     Dose/Rate Route Frequency Ordered Stop   07/09/19 1800  cefTRIAXone (ROCEPHIN) 1 g in sodium chloride 0.9 % 100 mL IVPB        1 g 200 mL/hr over 30 Minutes Intravenous Every 24 hours 07/09/19 0814 07/11/19 1748   07/08/19 1900  cefTRIAXone (ROCEPHIN) 1 g in sodium chloride 0.9 %  100 mL IVPB        1 g 200 mL/hr over 30 Minutes Intravenous  Once 07/08/19 1853 07/08/19 2015          Medications  Scheduled Meds: . aspirin  81 mg Oral Daily  . carvedilol  3.125 mg Oral BID WC  . cholecalciferol  5,000 Units Oral Daily  . divalproex  250 mg Oral Q12H  . enoxaparin (LOVENOX) injection  40 mg Subcutaneous Q24H  . feeding supplement (ENSURE ENLIVE)  237 mL Oral BID BM  . hydrOXYzine  25 mg Oral QHS  . levETIRAcetam  1,500 mg Oral BID  . megestrol  400 mg Oral Daily  . multivitamin with minerals  1 tablet Oral Daily  . QUEtiapine  50 mg Oral QHS   Continuous Infusions: PRN Meds:.acetaminophen **OR** acetaminophen, ibuprofen, ondansetron  **OR** ondansetron (ZOFRAN) IV, QUEtiapine      Subjective:   Cailee Blanke was seen and examined today.  Confused.  Difficult to obtain review of system from the Lisa Mclaughlin due to her mental status.  Not in any pain, no nausea vomiting or diarrhea.  No chest pain.  No acute events overnight.    Objective:   Vitals:   08/26/19 1335 08/26/19 2203 08/27/19 1021 08/27/19 1513  BP: (!) 93/56 124/72 (!) 107/59 (!) 115/56  Pulse: 65 85 60 72  Resp: 16 20  20   Temp: (!) 97.5 F (36.4 C) 98.7 F (37.1 C)  (!) 97.5 F (36.4 C)  TempSrc: Oral   Oral  SpO2: 99% 96%  99%  Weight:      Height:        Intake/Output Summary (Last 24 hours) at 08/27/2019 1601 Last data filed at 08/26/2019 2143 Gross per 24 hour  Intake 118 ml  Output --  Net 118 ml     Wt Readings from Last 3 Encounters:  08/14/19 53.7 kg  07/09/17 62.4 kg  06/22/17 61.3 kg     Exam  General: Alert and oriented x self, NAD  Cardiovascular: S1 S2 auscultated, no murmurs, RRR  Respiratory: Clear to auscultation bilaterally, no wheezing, rales or rhonchi  Gastrointestinal: Soft, nontender, nondistended, + bowel sounds  Ext: no pedal edema bilaterally  Neuro: moving all 4 extremities spontaneously  Musculoskeletal: No digital cyanosis, clubbing  Skin: No rashes  Psych: flat affect   Data Reviewed:  I have personally reviewed following labs and imaging studies  Micro Results No results found for this or any previous visit (from the past 240 hour(s)).  Radiology Reports No results found.  Lab Data:  CBC: Recent Labs  Lab 08/22/19 0304 08/24/19 0318  WBC 7.8 5.8  HGB 13.1 12.3  HCT 38.4 38.8  MCV 89.3 94.2  PLT 273 426   Basic Metabolic Panel: Recent Labs  Lab 08/22/19 0304 08/24/19 0318 08/25/19 0417  NA 140 141 141  K 4.1 3.5 4.1  CL 111 112* 111  CO2 19* 20* 20*  GLUCOSE 96 94 95  BUN 17 17 20   CREATININE 0.77 0.66 0.71  CALCIUM 9.3 9.1 9.1  MG  --  2.4  --   PHOS  --  3.7  --     GFR: Estimated Creatinine Clearance: 50.1 mL/min (by C-G formula based on SCr of 0.71 mg/dL). Liver Function Tests: No results for input(s): AST, ALT, ALKPHOS, BILITOT, PROT, ALBUMIN in the last 168 hours. No results for input(s): LIPASE, AMYLASE in the last 168 hours. No results for input(s): AMMONIA in the last 168 hours.  Coagulation Profile: No results for input(s): INR, PROTIME in the last 168 hours. Cardiac Enzymes: No results for input(s): CKTOTAL, CKMB, CKMBINDEX, TROPONINI in the last 168 hours. BNP (last 3 results) No results for input(s): PROBNP in the last 8760 hours. HbA1C: No results for input(s): HGBA1C in the last 72 hours. CBG: No results for input(s): GLUCAP in the last 168 hours. Lipid Profile: No results for input(s): CHOL, HDL, LDLCALC, TRIG, CHOLHDL, LDLDIRECT in the last 72 hours. Thyroid Function Tests: No results for input(s): TSH, T4TOTAL, FREET4, T3FREE, THYROIDAB in the last 72 hours. Anemia Panel: No results for input(s): VITAMINB12, FOLATE, FERRITIN, TIBC, IRON, RETICCTPCT in the last 72 hours. Urine analysis:    Component Value Date/Time   COLORURINE AMBER (A) 07/08/2019 1545   APPEARANCEUR CLOUDY (A) 07/08/2019 1545   LABSPEC 1.039 (H) 07/08/2019 1545   PHURINE 7.0 07/08/2019 1545   GLUCOSEU NEGATIVE 07/08/2019 1545   HGBUR SMALL (A) 07/08/2019 1545   HGBUR trace-lysed 07/26/2007 1551   BILIRUBINUR NEGATIVE 07/08/2019 1545   KETONESUR 20 (A) 07/08/2019 1545   PROTEINUR 100 (A) 07/08/2019 1545   UROBILINOGEN 0.2 07/26/2007 1551   NITRITE NEGATIVE 07/08/2019 1545   LEUKOCYTESUR SMALL (A) 07/08/2019 1545     Jariana Shumard M.D. [facility] 08/27/2019, 4:01 PM   Call night coverage person covering after 7pm

## 2019-08-28 MED ORDER — QUETIAPINE FUMARATE 25 MG PO TABS: 25.0000 mg | ORAL_TABLET | Freq: Every day | ORAL | Status: AC | PRN

## 2019-08-28 MED ORDER — MEGESTROL ACETATE 400 MG/10ML PO SUSP: 400.0000 mg | Freq: Every day | ORAL | 0 refills | Status: AC

## 2019-08-28 MED ORDER — QUETIAPINE FUMARATE 50 MG PO TABS: 50.0000 mg | ORAL_TABLET | Freq: Every day | ORAL | Status: AC

## 2019-08-28 MED ORDER — IBUPROFEN 200 MG PO TABS: 200.0000 mg | ORAL_TABLET | Freq: Four times a day (QID) | ORAL | 0 refills | Status: AC | PRN

## 2019-08-28 NOTE — Progress Notes (Signed)
Patients BP 138/118. NT unable to get accurate reading due to patient moving and agitated. NT and this nurse will try to get a better reading after patient is more calm and at resting state. Will continue to monitor the patient.

## 2019-08-28 NOTE — Plan of Care (Signed)
  Problem: Health Behavior/Discharge Planning: Goal: Ability to manage health-related needs will improve Outcome: Adequate for Discharge   Problem: Clinical Measurements: Goal: Will remain free from infection Outcome: Adequate for Discharge   Problem: Nutrition: Goal: Adequate nutrition will be maintained Outcome: Adequate for Discharge   Problem: Coping: Goal: Level of anxiety will decrease Outcome: Adequate for Discharge

## 2019-08-28 NOTE — Progress Notes (Signed)
Attempted to call report to [facility] and was told they would call back in 30 minutes.  EMS is here to transport patient.  [facility] aware.

## 2019-08-28 NOTE — TOC Transition Note (Signed)
Transition of Care [facility]) - CM/SW Discharge Note   Patient Details  Name: Deliliah Spranger MRN: [id number] Date of Birth: 1950/02/23  [facility] CM/SW Contact:  Trish Mage, LCSW Phone Number: 08/28/2019, 10:05 AM   Clinical Narrative:   Patient to transition to [facility] today.  Family notified. PTAR arranged.  Nursing, please call report to [phone], room 734.  Ask for RN on [facility]. TOC sign off.    Final next level of care: Memory Care Barriers to Discharge: Barriers Resolved   Patient Goals and CMS Choice Patient states their goals for this hospitalization and ongoing recovery are:: Unable to verbalize   Choice offered to / list presented to : Spouse  Discharge Placement                       Discharge Plan and Services     Post Acute Care Choice: [facility]                               Social Determinants of Health (SDOH) Interventions     Readmission Risk Interventions No flowsheet data found.

## 2019-08-28 NOTE — Progress Notes (Signed)
Patient is calm and asleep. NT was able to get a better BP reading 114/57 which is within the patients baseline. Will continue to monitor the patient.

## 2019-08-28 NOTE — Discharge Summary (Signed)
Physician Discharge Summary   Patient ID: Lisa Mclaughlin MRN: [id number] DOB/AGE: [birth year] 69 y.o.  Admit date: 07/08/2019 Discharge date: 08/28/2019  Primary Care Physician:  Patient, No Pcp Per   Recommendations for Outpatient Follow-up:  1. Follow up with PCP in 1-2 weeks  Home Health: Patient going to skilled nursing facility Equipment/Devices:   Discharge Condition: stable  CODE STATUS:  DNR   Diet recommendation: Regular diet with assistance, chopped meals   Discharge Diagnoses:    . 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 Hypernatremia . Agitation due to dementia ([place]) . Coronary artery disease involving native coronary artery of native heart with angina pectoris ([facility]) Seizure disorder UTI Essential hypertension Hypokalemia Moderate protein calorie malnutrition Adult failure to thrive  Consults: Psychiatry    Allergies:   Allergies  Allergen Reactions  . [name] [Aripiprazole] Other (See Comments)    Seizure hx  . Wellbutrin [Bupropion] Other (See Comments)    Seizure hx  . Gluten Meal Other (See Comments)    Allergic sensitivity  . Other Other (See Comments)    Allergic sensitivity-Food allergies: almond, banana, casein, cheese, cola, egg white, flaxseed, gluten, malt, cow and goat milks, mushrooms, pineapple, salmon, sesame, [place], wheat, whey, bakers and brewers yeast, yogurt.   . Tetracycline Hcl Other (See Comments)    unknown     DISCHARGE MEDICATIONS: Allergies as of 08/28/2019      Reactions   [name] [aripiprazole] Other (See Comments)   Seizure hx   Wellbutrin [bupropion] Other (See Comments)   Seizure hx   Gluten Meal Other (See Comments)   Allergic sensitivity   Other Other (See Comments)   Allergic sensitivity-Food allergies: almond, banana, casein, cheese, cola, egg white, flaxseed, gluten, malt, cow and goat milks, mushrooms, pineapple, salmon, sesame, [place], wheat, whey, bakers and brewers yeast, yogurt.     Tetracycline Hcl Other (See Comments)   unknown      Medication List    STOP taking these medications   chlorhexidine gluconate (MEDLINE KIT) 0.12 % solution Commonly known as: PERIDEX   clonazePAM 0.5 MG tablet Commonly known as: KLONOPIN   levETIRAcetam 1000 MG tablet Commonly known as: KEPPRA   levETIRAcetam 500 MG 24 hr tablet Commonly known as: Keppra XR Replaced by: levETIRAcetam 100 MG/ML solution   levETIRAcetam 500 MG tablet Commonly known as: KEPPRA   Melatonin 10 MG Tabs     TAKE these medications   acetaminophen 500 MG tablet Commonly known as: TYLENOL Take 500 mg by mouth in the morning, at noon, and at bedtime.   aspirin 81 MG tablet Take 1 tablet (81 mg total) by mouth daily.   carvedilol 3.125 MG tablet Commonly known as: COREG TAKE 1 TABLET(3.125 MG) BY MOUTH TWICE DAILY What changed: See the new instructions.   divalproex 125 MG capsule Commonly known as: DEPAKOTE SPRINKLE Take 2 capsules (250 mg total) by mouth every 12 (twelve) hours. What changed:   how much to take  when to take this   High Potency Multivit/Min/Iron Tabs Take 1 tablet by mouth daily.   hydrOXYzine 25 MG tablet Commonly known as: ATARAX/VISTARIL Take 1 tablet (25 mg total) by mouth at bedtime.   ibuprofen 200 MG tablet Commonly known as: ADVIL Take 1 tablet (200 mg total) by mouth every 6 (six) hours as needed for headache or mild pain. What changed:   when to take this  reasons to take this   levETIRAcetam 100 MG/ML solution Commonly known as: KEPPRA Take 15 mLs (  1,500 mg total) by mouth 2 (two) times daily. Replaces: levETIRAcetam 500 MG 24 hr tablet   megestrol 400 MG/10ML suspension Commonly known as: MEGACE Take 10 mLs (400 mg total) by mouth daily.   nitroGLYCERIN 0.4 MG SL tablet Commonly known as: NITROSTAT Place 1 tablet (0.4 mg total) under the tongue every 5 (five) minutes as needed for chest pain.   QUEtiapine 50 MG tablet Commonly known  as: SEROQUEL Take 1 tablet (50 mg total) by mouth at bedtime. What changed: when to take this   QUEtiapine 25 MG tablet Commonly known as: SEROQUEL Take 1 tablet (25 mg total) by mouth daily as needed (agitation). What changed:   how much to take  when to take this  reasons to take this   Vitamin D3 125 MCG (5000 UT) Caps Take 5,000 Units by mouth daily.        Brief H and P: For complete details please refer to admission H and P, but in brief69 yo Patient with past medical history of dementia, seizure disorder and hypertension as well as CAD presents with failure to thrive and behavioral disturbances regarding her dementia. Patienthasa prolonged hospital stay due to inability to find placement appropriate for the patient. Psychiatry has cleared the patient for placement. Currently patient is suffering from poor p.o. intake and resultant hypernatremia. At this time patient is medically stable   Hospital Course:   Alzheimer's dementia advanced with behavioral disturbance /anxiety/ depression/ agitation: -Agitation has resolved, currently pleasantly confused.  Psychiatry was consulted, medications has been adjusted. -Continue hydroxyzine 25 mg daily at bedtime, Seroquel 50 mg daily at bedtime, Seroquel 25 mg daily as needed for agitation as needed.  Continue Depakote  -Continue fall precautions  Hypernatremia -Sodium 153, secondary to poor oral intake and volume depletion  -has improved, currently stable at 141, tolerating diet without any difficulty Encourage p.o. diet   Seizure disorder -Stable, continue Keppra and Depakote.  UTI -No hematuria, patient has completed antibiotic course.  Cultures negative.  History of CAD, hypertension -Currently stable, no chest pain, continue aspirin, beta-blocker  Goals of care  -DNR. Husband has been having discussion with hospice about transitioning the patient.   Hypokalemia Resolved  Mildly elevated LFTs ALT  53, continue to monitor  Protein calorie malnutrition moderate, adult failure to thrive/weight loss, Encourage oral intake, nutritional supplements Estimated body mass index is 22.37 kg/m as calculated from the following:   Height as of this encounter: 5' 1" (1.549 m).   Weight as of this encounter: 53.7 kg.  Day of Discharge S: Pleasantly confused, not in any pain.  No chest pain, nausea vomiting or diarrhea.  BP (!) 114/57 (BP Location: Right Arm)   Pulse 62   Temp 98.2 F (36.8 C) (Oral)   Resp 18   Ht 5' 1" (1.549 m)   Wt 53.7 kg   SpO2 99%   BMI 22.37 kg/m   Physical Exam: General: Alert and awake oriented to self, NAD HEENT: anicteric sclera, pupils reactive to light and accommodation CVS: S1-S2 clear no murmur rubs or gallops Chest: clear to auscultation bilaterally, no wheezing rales or rhonchi Abdomen: soft nontender, nondistended, normal bowel sounds Extremities: no cyanosis, clubbing or edema noted bilaterally Neuro: moving all 4 extremities spontaneously    Get Medicines reviewed and adjusted: Please take all your medications with you for your next visit with your Primary MD  Please request your Primary MD to go over all hospital tests and procedure/radiological results at the follow up.  Please ask your Primary MD to get all Hospital records sent to his/her office.  If you experience worsening of your admission symptoms, develop shortness of breath, life threatening emergency, suicidal or homicidal thoughts you must seek medical attention immediately by calling 911 or calling your MD immediately  if symptoms less severe.  You must read complete instructions/literature along with all the possible adverse reactions/side effects for all the Medicines you take and that have been prescribed to you. Take any new Medicines after you have completely understood and accept all the possible adverse reactions/side effects.   Do not drive when taking pain medications.    Do not take more than prescribed Pain, Sleep and Anxiety Medications  Special Instructions: If you have smoked or chewed Tobacco  in the last 2 yrs please stop smoking, stop any regular Alcohol  and or any Recreational drug use.  Wear Seat belts while driving.  Please note  You were cared for by a hospitalist during your hospital stay. Once you are discharged, your primary care physician will handle any further medical issues. Please note that NO REFILLS for any discharge medications will be authorized once you are discharged, as it is imperative that you return to your primary care physician (or establish a relationship with a primary care physician if you do not have one) for your aftercare needs so that they can reassess your need for medications and monitor your lab values.   The results of significant diagnostics from this hospitalization (including imaging, microbiology, ancillary and laboratory) are listed below for reference.      Procedures/Studies:   No results found.    LAB RESULTS: Basic Metabolic Panel: Recent Labs  Lab 08/24/19 0318 08/25/19 0417  NA 141 141  K 3.5 4.1  CL 112* 111  CO2 20* 20*  GLUCOSE 94 95  BUN 17 20  CREATININE 0.66 0.71  CALCIUM 9.1 9.1  MG 2.4  --   PHOS 3.7  --    Liver Function Tests: No results for input(s): AST, ALT, ALKPHOS, BILITOT, PROT, ALBUMIN in the last 168 hours. No results for input(s): LIPASE, AMYLASE in the last 168 hours. No results for input(s): AMMONIA in the last 168 hours. CBC: Recent Labs  Lab 08/22/19 0304 08/22/19 0304 08/24/19 0318  WBC 7.8  --  5.8  HGB 13.1  --  12.3  HCT 38.4  --  38.8  MCV 89.3   < > 94.2  PLT 273  --  267   < > = values in this interval not displayed.   Cardiac Enzymes: No results for input(s): CKTOTAL, CKMB, CKMBINDEX, TROPONINI in the last 168 hours. BNP: Invalid input(s): POCBNP CBG: No results for input(s): GLUCAP in the last 168 hours.     Disposition and  Follow-up: Discharge Instructions    Call MD for:  difficulty breathing, headache or visual disturbances   Complete by: As directed    Call MD for:  extreme fatigue   Complete by: As directed    Call MD for:  persistant dizziness or light-headedness   Complete by: As directed    Call MD for:  persistant nausea and vomiting   Complete by: As directed    Call MD for:  severe uncontrolled pain   Complete by: As directed    Call MD for:  temperature >100.4   Complete by: As directed    Diet - low sodium heart healthy   Complete by: As directed    Discharge instructions  Complete by: As directed    Please review instructions on the discharge summary.  You were cared for by a hospitalist during your hospital stay. If you have any questions about your discharge medications or the care you received while you were in the hospital after you are discharged, you can call the unit and asked to speak with the hospitalist on call if the hospitalist that took care of you is not available. Once you are discharged, your primary care physician will handle any further medical issues. Please note that NO REFILLS for any discharge medications will be authorized once you are discharged, as it is imperative that you return to your primary care physician (or establish a relationship with a primary care physician if you do not have one) for your aftercare needs so that they can reassess your need for medications and monitor your lab values. If you do not have a primary care physician, you can call [phone] for a physician referral.   Increase activity slowly   Complete by: As directed    Increase activity slowly   Complete by: As directed        DISPOSITION: Skilled nursing facility   DISCHARGE FOLLOW-UP: Follow-up outpatient with SNF    Time coordinating discharge:  35 minutes  Signed:   Estill Cotta M.D. [facility] 08/28/2019, 10:03 AM

## 2019-08-30 DIAGNOSIS — D649 Anemia, unspecified: Secondary | ICD-10-CM | POA: Diagnosis not present

## 2019-08-30 DIAGNOSIS — Z5181 Encounter for therapeutic drug level monitoring: Secondary | ICD-10-CM | POA: Diagnosis not present

## 2019-08-30 DIAGNOSIS — E039 Hypothyroidism, unspecified: Secondary | ICD-10-CM | POA: Diagnosis not present

## 2019-08-30 DIAGNOSIS — E119 Type 2 diabetes mellitus without complications: Secondary | ICD-10-CM | POA: Diagnosis not present

## 2019-08-30 DIAGNOSIS — Z79899 Other long term (current) drug therapy: Secondary | ICD-10-CM | POA: Diagnosis not present

## 2019-08-30 DIAGNOSIS — R7309 Other abnormal glucose: Secondary | ICD-10-CM | POA: Diagnosis not present

## 2019-08-30 DIAGNOSIS — D510 Vitamin B12 deficiency anemia due to intrinsic factor deficiency: Secondary | ICD-10-CM | POA: Diagnosis not present

## 2019-08-30 DIAGNOSIS — E559 Vitamin D deficiency, unspecified: Secondary | ICD-10-CM | POA: Diagnosis not present

## 2019-08-30 DIAGNOSIS — R946 Abnormal results of thyroid function studies: Secondary | ICD-10-CM | POA: Diagnosis not present

## 2019-08-30 DIAGNOSIS — R569 Unspecified convulsions: Secondary | ICD-10-CM | POA: Diagnosis not present

## 2019-09-01 DIAGNOSIS — I2510 Atherosclerotic heart disease of native coronary artery without angina pectoris: Secondary | ICD-10-CM | POA: Diagnosis not present

## 2019-09-01 DIAGNOSIS — G301 Alzheimer's disease with late onset: Secondary | ICD-10-CM | POA: Diagnosis not present

## 2019-09-01 DIAGNOSIS — E440 Moderate protein-calorie malnutrition: Secondary | ICD-10-CM | POA: Diagnosis not present

## 2019-09-02 DIAGNOSIS — G301 Alzheimer's disease with late onset: Secondary | ICD-10-CM | POA: Diagnosis not present

## 2019-09-02 DIAGNOSIS — E440 Moderate protein-calorie malnutrition: Secondary | ICD-10-CM | POA: Diagnosis not present

## 2019-09-02 DIAGNOSIS — I2510 Atherosclerotic heart disease of native coronary artery without angina pectoris: Secondary | ICD-10-CM | POA: Diagnosis not present

## 2019-09-09 DIAGNOSIS — G301 Alzheimer's disease with late onset: Secondary | ICD-10-CM | POA: Diagnosis not present

## 2019-09-09 DIAGNOSIS — E440 Moderate protein-calorie malnutrition: Secondary | ICD-10-CM | POA: Diagnosis not present

## 2019-09-10 DIAGNOSIS — W1789XA Other fall from one level to another, initial encounter: Secondary | ICD-10-CM | POA: Diagnosis not present

## 2019-09-11 IMAGING — DX DG CHEST 1V PORT
1 series · 1 of 1 positions shown · non-contrast
Comparison: Chest x-ray earlier same day [DATE] p.m., 03/23/2016 and
earlier. CT heart 03/31/2016.

CLINICAL DATA: Intubation.  Multiple seizures.

EXAM:
PORTABLE CHEST 1 VIEW

[chest]
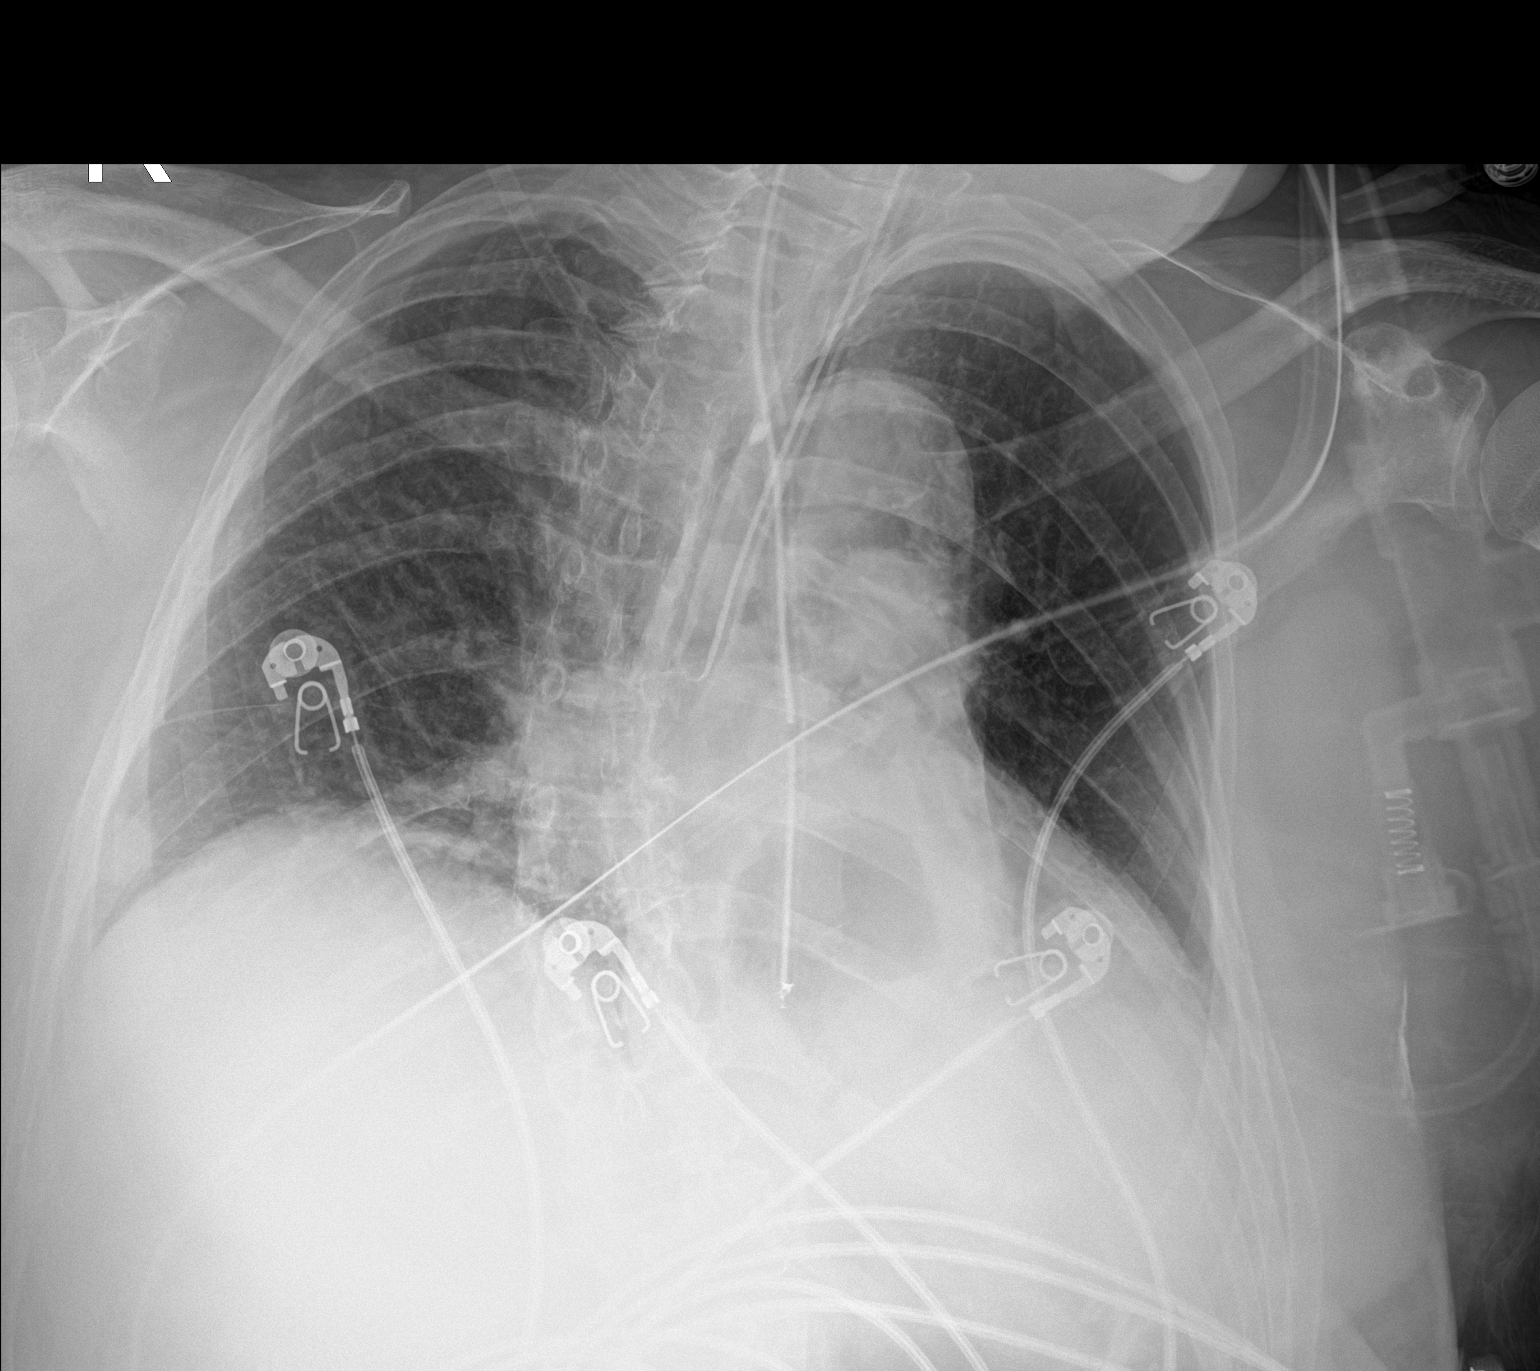

[1 of 1 positions shown; findings below may reference images not displayed]

FINDINGS: Endotracheal tube tip in the origin of the right mainstem bronchus.
This should be withdrawn approximately 4-5 cm. Nasogastric tube tip
within the hiatal hernia in the lower chest. Nodule in the right
costophrenic angle as noted previously. Development of dense left
lower lobe atelectasis since earlier today. Mild right basilar
atelectasis which is unchanged. No new pulmonary parenchymal
abnormalities elsewhere.
IMPRESSION: 1. Right mainstem bronchus intubation. The endotracheal tube should
with withdrawn approximately 4-5 cm for appropriate positioning in
the distal trachea.
2. Nasogastric tube tip within the hiatal hernia in the lower chest.
3. New dense left lower lobe atelectasis since earlier today. Stable
mild right basilar atelectasis.
4. Right basilar lung nodule as noted previously.

## 2019-09-15 DIAGNOSIS — G301 Alzheimer's disease with late onset: Secondary | ICD-10-CM | POA: Diagnosis not present

## 2019-09-16 DIAGNOSIS — E440 Moderate protein-calorie malnutrition: Secondary | ICD-10-CM | POA: Diagnosis not present

## 2019-09-16 DIAGNOSIS — Z789 Other specified health status: Secondary | ICD-10-CM | POA: Diagnosis not present

## 2019-09-16 DIAGNOSIS — G301 Alzheimer's disease with late onset: Secondary | ICD-10-CM | POA: Diagnosis not present

## 2019-09-16 DIAGNOSIS — I2510 Atherosclerotic heart disease of native coronary artery without angina pectoris: Secondary | ICD-10-CM | POA: Diagnosis not present

## 2019-10-03 DIAGNOSIS — R300 Dysuria: Secondary | ICD-10-CM | POA: Diagnosis not present

## 2019-10-03 DIAGNOSIS — R2689 Other abnormalities of gait and mobility: Secondary | ICD-10-CM | POA: Diagnosis not present

## 2019-10-03 DIAGNOSIS — Z5189 Encounter for other specified aftercare: Secondary | ICD-10-CM | POA: Diagnosis not present

## 2019-10-03 DIAGNOSIS — M6281 Muscle weakness (generalized): Secondary | ICD-10-CM | POA: Diagnosis not present

## 2019-10-06 DIAGNOSIS — K449 Diaphragmatic hernia without obstruction or gangrene: Secondary | ICD-10-CM | POA: Diagnosis not present

## 2019-10-06 DIAGNOSIS — E43 Unspecified severe protein-calorie malnutrition: Secondary | ICD-10-CM | POA: Diagnosis not present

## 2019-10-06 DIAGNOSIS — J9811 Atelectasis: Secondary | ICD-10-CM | POA: Diagnosis not present

## 2019-10-06 DIAGNOSIS — Z91018 Allergy to other foods: Secondary | ICD-10-CM | POA: Diagnosis not present

## 2019-10-06 DIAGNOSIS — G300 Alzheimer's disease with early onset: Secondary | ICD-10-CM | POA: Diagnosis not present

## 2019-10-06 DIAGNOSIS — Z888 Allergy status to other drugs, medicaments and biological substances status: Secondary | ICD-10-CM | POA: Diagnosis not present

## 2019-10-06 DIAGNOSIS — Z7982 Long term (current) use of aspirin: Secondary | ICD-10-CM | POA: Diagnosis not present

## 2019-10-06 DIAGNOSIS — I6782 Cerebral ischemia: Secondary | ICD-10-CM | POA: Diagnosis not present

## 2019-10-06 DIAGNOSIS — R410 Disorientation, unspecified: Secondary | ICD-10-CM | POA: Diagnosis not present

## 2019-10-06 DIAGNOSIS — R918 Other nonspecific abnormal finding of lung field: Secondary | ICD-10-CM | POA: Diagnosis not present

## 2019-10-06 DIAGNOSIS — Z681 Body mass index (BMI) 19 or less, adult: Secondary | ICD-10-CM | POA: Diagnosis not present

## 2019-10-06 DIAGNOSIS — G319 Degenerative disease of nervous system, unspecified: Secondary | ICD-10-CM | POA: Diagnosis not present

## 2019-10-06 DIAGNOSIS — R569 Unspecified convulsions: Secondary | ICD-10-CM | POA: Diagnosis not present

## 2019-10-06 DIAGNOSIS — I2510 Atherosclerotic heart disease of native coronary artery without angina pectoris: Secondary | ICD-10-CM | POA: Diagnosis not present

## 2019-10-06 DIAGNOSIS — G9340 Encephalopathy, unspecified: Secondary | ICD-10-CM | POA: Diagnosis not present

## 2019-10-06 DIAGNOSIS — I10 Essential (primary) hypertension: Secondary | ICD-10-CM | POA: Diagnosis not present

## 2019-10-06 DIAGNOSIS — Z743 Need for continuous supervision: Secondary | ICD-10-CM | POA: Diagnosis not present

## 2019-10-06 DIAGNOSIS — Z79899 Other long term (current) drug therapy: Secondary | ICD-10-CM | POA: Diagnosis not present

## 2019-10-06 DIAGNOSIS — Z66 Do not resuscitate: Secondary | ICD-10-CM | POA: Diagnosis not present

## 2019-10-06 DIAGNOSIS — I517 Cardiomegaly: Secondary | ICD-10-CM | POA: Diagnosis not present

## 2019-10-06 DIAGNOSIS — I499 Cardiac arrhythmia, unspecified: Secondary | ICD-10-CM | POA: Diagnosis not present

## 2019-10-06 DIAGNOSIS — R531 Weakness: Secondary | ICD-10-CM | POA: Diagnosis not present

## 2019-10-06 DIAGNOSIS — R9082 White matter disease, unspecified: Secondary | ICD-10-CM | POA: Diagnosis not present

## 2019-10-06 DIAGNOSIS — J690 Pneumonitis due to inhalation of food and vomit: Secondary | ICD-10-CM | POA: Diagnosis not present

## 2019-10-06 DIAGNOSIS — W19XXXA Unspecified fall, initial encounter: Secondary | ICD-10-CM | POA: Diagnosis not present

## 2019-10-06 DIAGNOSIS — Z87891 Personal history of nicotine dependence: Secondary | ICD-10-CM | POA: Diagnosis not present

## 2019-10-08 DIAGNOSIS — G300 Alzheimer's disease with early onset: Secondary | ICD-10-CM | POA: Diagnosis not present

## 2019-10-09 DIAGNOSIS — G300 Alzheimer's disease with early onset: Secondary | ICD-10-CM | POA: Diagnosis not present

## 2019-10-10 DIAGNOSIS — G300 Alzheimer's disease with early onset: Secondary | ICD-10-CM | POA: Diagnosis not present

## 2019-10-13 DIAGNOSIS — I2510 Atherosclerotic heart disease of native coronary artery without angina pectoris: Secondary | ICD-10-CM | POA: Diagnosis not present

## 2019-10-13 DIAGNOSIS — R911 Solitary pulmonary nodule: Secondary | ICD-10-CM | POA: Diagnosis not present

## 2019-10-14 DIAGNOSIS — R946 Abnormal results of thyroid function studies: Secondary | ICD-10-CM | POA: Diagnosis not present

## 2019-10-14 DIAGNOSIS — D510 Vitamin B12 deficiency anemia due to intrinsic factor deficiency: Secondary | ICD-10-CM | POA: Diagnosis not present

## 2019-10-14 DIAGNOSIS — G301 Alzheimer's disease with late onset: Secondary | ICD-10-CM | POA: Diagnosis not present

## 2019-10-14 DIAGNOSIS — R911 Solitary pulmonary nodule: Secondary | ICD-10-CM | POA: Diagnosis not present

## 2019-10-14 DIAGNOSIS — I10 Essential (primary) hypertension: Secondary | ICD-10-CM | POA: Diagnosis not present

## 2019-10-14 DIAGNOSIS — E559 Vitamin D deficiency, unspecified: Secondary | ICD-10-CM | POA: Diagnosis not present

## 2019-10-14 DIAGNOSIS — I2510 Atherosclerotic heart disease of native coronary artery without angina pectoris: Secondary | ICD-10-CM | POA: Diagnosis not present

## 2019-10-18 DIAGNOSIS — Z5181 Encounter for therapeutic drug level monitoring: Secondary | ICD-10-CM | POA: Diagnosis not present

## 2019-10-21 DIAGNOSIS — E440 Moderate protein-calorie malnutrition: Secondary | ICD-10-CM | POA: Diagnosis not present

## 2019-10-21 DIAGNOSIS — G301 Alzheimer's disease with late onset: Secondary | ICD-10-CM | POA: Diagnosis not present

## 2019-10-21 DIAGNOSIS — I2510 Atherosclerotic heart disease of native coronary artery without angina pectoris: Secondary | ICD-10-CM | POA: Diagnosis not present

## 2019-10-29 DIAGNOSIS — R799 Abnormal finding of blood chemistry, unspecified: Secondary | ICD-10-CM | POA: Diagnosis not present

## 2019-10-30 DIAGNOSIS — E440 Moderate protein-calorie malnutrition: Secondary | ICD-10-CM | POA: Diagnosis not present

## 2019-10-30 DIAGNOSIS — G309 Alzheimer's disease, unspecified: Secondary | ICD-10-CM | POA: Diagnosis not present

## 2019-10-30 DIAGNOSIS — E531 Pyridoxine deficiency: Secondary | ICD-10-CM | POA: Diagnosis not present

## 2019-10-30 DIAGNOSIS — G301 Alzheimer's disease with late onset: Secondary | ICD-10-CM | POA: Diagnosis not present

## 2019-10-30 DIAGNOSIS — R413 Other amnesia: Secondary | ICD-10-CM | POA: Diagnosis not present

## 2019-11-04 DIAGNOSIS — R911 Solitary pulmonary nodule: Secondary | ICD-10-CM | POA: Diagnosis not present

## 2019-11-04 DIAGNOSIS — G301 Alzheimer's disease with late onset: Secondary | ICD-10-CM | POA: Diagnosis not present

## 2019-11-04 DIAGNOSIS — E440 Moderate protein-calorie malnutrition: Secondary | ICD-10-CM | POA: Diagnosis not present

## 2019-11-05 DIAGNOSIS — D649 Anemia, unspecified: Secondary | ICD-10-CM | POA: Diagnosis not present

## 2019-11-05 DIAGNOSIS — N19 Unspecified kidney failure: Secondary | ICD-10-CM | POA: Diagnosis not present

## 2019-12-18 DIAGNOSIS — E440 Moderate protein-calorie malnutrition: Secondary | ICD-10-CM | POA: Diagnosis not present

## 2019-12-18 DIAGNOSIS — I2510 Atherosclerotic heart disease of native coronary artery without angina pectoris: Secondary | ICD-10-CM | POA: Diagnosis not present

## 2019-12-18 DIAGNOSIS — G301 Alzheimer's disease with late onset: Secondary | ICD-10-CM | POA: Diagnosis not present

## 2020-01-06 DIAGNOSIS — G301 Alzheimer's disease with late onset: Secondary | ICD-10-CM | POA: Diagnosis not present

## 2020-01-06 DIAGNOSIS — E440 Moderate protein-calorie malnutrition: Secondary | ICD-10-CM | POA: Diagnosis not present

## 2020-01-06 DIAGNOSIS — U071 COVID-19: Secondary | ICD-10-CM | POA: Diagnosis not present

## 2020-01-06 DIAGNOSIS — I2510 Atherosclerotic heart disease of native coronary artery without angina pectoris: Secondary | ICD-10-CM | POA: Diagnosis not present

## 2020-01-06 DIAGNOSIS — R911 Solitary pulmonary nodule: Secondary | ICD-10-CM | POA: Diagnosis not present

## 2020-01-14 DIAGNOSIS — U071 COVID-19: Secondary | ICD-10-CM | POA: Diagnosis not present

## 2020-01-20 DIAGNOSIS — E440 Moderate protein-calorie malnutrition: Secondary | ICD-10-CM | POA: Diagnosis not present

## 2020-01-20 DIAGNOSIS — U071 COVID-19: Secondary | ICD-10-CM | POA: Diagnosis not present

## 2020-01-20 DIAGNOSIS — G301 Alzheimer's disease with late onset: Secondary | ICD-10-CM | POA: Diagnosis not present

## 2020-01-22 DIAGNOSIS — G309 Alzheimer's disease, unspecified: Secondary | ICD-10-CM | POA: Diagnosis not present

## 2020-01-27 DIAGNOSIS — R198 Other specified symptoms and signs involving the digestive system and abdomen: Secondary | ICD-10-CM | POA: Diagnosis not present

## 2020-01-27 DIAGNOSIS — E440 Moderate protein-calorie malnutrition: Secondary | ICD-10-CM | POA: Diagnosis not present

## 2020-02-05 DIAGNOSIS — G301 Alzheimer's disease with late onset: Secondary | ICD-10-CM | POA: Diagnosis not present

## 2020-02-05 DIAGNOSIS — R911 Solitary pulmonary nodule: Secondary | ICD-10-CM | POA: Diagnosis not present

## 2020-02-05 DIAGNOSIS — Z7189 Other specified counseling: Secondary | ICD-10-CM | POA: Diagnosis not present

## 2020-02-23 DIAGNOSIS — K047 Periapical abscess without sinus: Secondary | ICD-10-CM | POA: Diagnosis not present

## 2020-03-23 DIAGNOSIS — M7989 Other specified soft tissue disorders: Secondary | ICD-10-CM | POA: Diagnosis not present

## 2020-03-23 DIAGNOSIS — M79642 Pain in left hand: Secondary | ICD-10-CM | POA: Diagnosis not present

## 2020-03-23 DIAGNOSIS — G309 Alzheimer's disease, unspecified: Secondary | ICD-10-CM | POA: Diagnosis not present

## 2020-03-24 DIAGNOSIS — S60052D Contusion of left little finger without damage to nail, subsequent encounter: Secondary | ICD-10-CM | POA: Diagnosis not present

## 2020-04-13 DIAGNOSIS — I2510 Atherosclerotic heart disease of native coronary artery without angina pectoris: Secondary | ICD-10-CM | POA: Diagnosis not present

## 2020-04-13 DIAGNOSIS — E559 Vitamin D deficiency, unspecified: Secondary | ICD-10-CM | POA: Diagnosis not present

## 2020-04-13 DIAGNOSIS — E440 Moderate protein-calorie malnutrition: Secondary | ICD-10-CM | POA: Diagnosis not present

## 2020-04-13 DIAGNOSIS — G301 Alzheimer's disease with late onset: Secondary | ICD-10-CM | POA: Diagnosis not present

## 2020-04-13 DIAGNOSIS — R911 Solitary pulmonary nodule: Secondary | ICD-10-CM | POA: Diagnosis not present

## 2020-04-15 DIAGNOSIS — Z5181 Encounter for therapeutic drug level monitoring: Secondary | ICD-10-CM | POA: Diagnosis not present

## 2020-04-19 DIAGNOSIS — R1319 Other dysphagia: Secondary | ICD-10-CM | POA: Diagnosis not present

## 2020-04-20 DIAGNOSIS — R1312 Dysphagia, oropharyngeal phase: Secondary | ICD-10-CM | POA: Diagnosis not present

## 2020-04-20 DIAGNOSIS — Z5189 Encounter for other specified aftercare: Secondary | ICD-10-CM | POA: Diagnosis not present

## 2020-04-23 DIAGNOSIS — Z5189 Encounter for other specified aftercare: Secondary | ICD-10-CM | POA: Diagnosis not present

## 2020-04-23 DIAGNOSIS — R1312 Dysphagia, oropharyngeal phase: Secondary | ICD-10-CM | POA: Diagnosis not present

## 2020-04-27 DIAGNOSIS — R1312 Dysphagia, oropharyngeal phase: Secondary | ICD-10-CM | POA: Diagnosis not present

## 2020-04-27 DIAGNOSIS — Z5189 Encounter for other specified aftercare: Secondary | ICD-10-CM | POA: Diagnosis not present

## 2020-04-30 DIAGNOSIS — Z5189 Encounter for other specified aftercare: Secondary | ICD-10-CM | POA: Diagnosis not present

## 2020-04-30 DIAGNOSIS — R1312 Dysphagia, oropharyngeal phase: Secondary | ICD-10-CM | POA: Diagnosis not present

## 2020-05-04 DIAGNOSIS — Z5189 Encounter for other specified aftercare: Secondary | ICD-10-CM | POA: Diagnosis not present

## 2020-05-04 DIAGNOSIS — R1312 Dysphagia, oropharyngeal phase: Secondary | ICD-10-CM | POA: Diagnosis not present

## 2020-05-27 DIAGNOSIS — H00011 Hordeolum externum right upper eyelid: Secondary | ICD-10-CM | POA: Diagnosis not present

## 2020-06-15 DIAGNOSIS — G308 Other Alzheimer's disease: Secondary | ICD-10-CM | POA: Diagnosis not present

## 2020-06-15 DIAGNOSIS — R3129 Other microscopic hematuria: Secondary | ICD-10-CM | POA: Diagnosis not present

## 2020-06-16 DIAGNOSIS — Z79899 Other long term (current) drug therapy: Secondary | ICD-10-CM | POA: Diagnosis not present

## 2020-06-16 DIAGNOSIS — R799 Abnormal finding of blood chemistry, unspecified: Secondary | ICD-10-CM | POA: Diagnosis not present

## 2020-06-17 DIAGNOSIS — R0602 Shortness of breath: Secondary | ICD-10-CM | POA: Diagnosis not present

## 2020-06-17 DIAGNOSIS — R310 Gross hematuria: Secondary | ICD-10-CM | POA: Diagnosis not present

## 2020-06-17 DIAGNOSIS — R0689 Other abnormalities of breathing: Secondary | ICD-10-CM | POA: Diagnosis not present

## 2020-06-22 DIAGNOSIS — N390 Urinary tract infection, site not specified: Secondary | ICD-10-CM | POA: Diagnosis not present

## 2020-06-22 DIAGNOSIS — G309 Alzheimer's disease, unspecified: Secondary | ICD-10-CM | POA: Diagnosis not present

## 2020-06-28 DIAGNOSIS — G309 Alzheimer's disease, unspecified: Secondary | ICD-10-CM | POA: Diagnosis not present

## 2020-06-28 DIAGNOSIS — R401 Stupor: Secondary | ICD-10-CM | POA: Diagnosis not present

## 2020-06-29 DIAGNOSIS — R0689 Other abnormalities of breathing: Secondary | ICD-10-CM | POA: Diagnosis not present

## 2020-06-29 DIAGNOSIS — R410 Disorientation, unspecified: Secondary | ICD-10-CM | POA: Diagnosis not present

## 2020-06-29 DIAGNOSIS — R5383 Other fatigue: Secondary | ICD-10-CM | POA: Diagnosis not present

## 2020-06-30 DIAGNOSIS — R401 Stupor: Secondary | ICD-10-CM | POA: Diagnosis not present

## 2020-07-01 DIAGNOSIS — N390 Urinary tract infection, site not specified: Secondary | ICD-10-CM | POA: Diagnosis not present

## 2020-07-06 DIAGNOSIS — N390 Urinary tract infection, site not specified: Secondary | ICD-10-CM | POA: Diagnosis not present

## 2020-07-13 DIAGNOSIS — R1319 Other dysphagia: Secondary | ICD-10-CM | POA: Diagnosis not present

## 2020-07-13 DIAGNOSIS — G308 Other Alzheimer's disease: Secondary | ICD-10-CM | POA: Diagnosis not present

## 2020-07-13 DIAGNOSIS — K5909 Other constipation: Secondary | ICD-10-CM | POA: Diagnosis not present

## 2020-07-14 DIAGNOSIS — G308 Other Alzheimer's disease: Secondary | ICD-10-CM | POA: Diagnosis not present

## 2020-07-14 DIAGNOSIS — M810 Age-related osteoporosis without current pathological fracture: Secondary | ICD-10-CM | POA: Diagnosis not present

## 2020-07-14 DIAGNOSIS — W1789XA Other fall from one level to another, initial encounter: Secondary | ICD-10-CM | POA: Diagnosis not present

## 2020-07-15 DIAGNOSIS — Z5181 Encounter for therapeutic drug level monitoring: Secondary | ICD-10-CM | POA: Diagnosis not present

## 2020-08-02 DIAGNOSIS — R051 Acute cough: Secondary | ICD-10-CM | POA: Diagnosis not present

## 2020-08-02 DIAGNOSIS — R059 Cough, unspecified: Secondary | ICD-10-CM | POA: Diagnosis not present

## 2020-08-02 DIAGNOSIS — R5383 Other fatigue: Secondary | ICD-10-CM | POA: Diagnosis not present

## 2020-08-03 DIAGNOSIS — R059 Cough, unspecified: Secondary | ICD-10-CM | POA: Diagnosis not present

## 2020-08-03 DIAGNOSIS — E440 Moderate protein-calorie malnutrition: Secondary | ICD-10-CM | POA: Diagnosis not present

## 2020-08-03 DIAGNOSIS — R410 Disorientation, unspecified: Secondary | ICD-10-CM | POA: Diagnosis not present

## 2020-08-04 DIAGNOSIS — R410 Disorientation, unspecified: Secondary | ICD-10-CM | POA: Diagnosis not present

## 2020-08-05 DIAGNOSIS — R410 Disorientation, unspecified: Secondary | ICD-10-CM | POA: Diagnosis not present

## 2020-08-05 DIAGNOSIS — E440 Moderate protein-calorie malnutrition: Secondary | ICD-10-CM | POA: Diagnosis not present

## 2020-08-05 DIAGNOSIS — R5381 Other malaise: Secondary | ICD-10-CM | POA: Diagnosis not present

## 2020-08-05 DIAGNOSIS — I2510 Atherosclerotic heart disease of native coronary artery without angina pectoris: Secondary | ICD-10-CM | POA: Diagnosis not present

## 2020-08-06 DIAGNOSIS — R410 Disorientation, unspecified: Secondary | ICD-10-CM | POA: Diagnosis not present

## 2020-08-09 DIAGNOSIS — N390 Urinary tract infection, site not specified: Secondary | ICD-10-CM | POA: Diagnosis not present

## 2020-08-25 DIAGNOSIS — H0100A Unspecified blepharitis right eye, upper and lower eyelids: Secondary | ICD-10-CM | POA: Diagnosis not present

## 2020-08-25 DIAGNOSIS — H0100B Unspecified blepharitis left eye, upper and lower eyelids: Secondary | ICD-10-CM | POA: Diagnosis not present

## 2020-08-25 DIAGNOSIS — Z961 Presence of intraocular lens: Secondary | ICD-10-CM | POA: Diagnosis not present

## 2020-08-25 DIAGNOSIS — H18413 Arcus senilis, bilateral: Secondary | ICD-10-CM | POA: Diagnosis not present

## 2020-08-26 DIAGNOSIS — L603 Nail dystrophy: Secondary | ICD-10-CM | POA: Diagnosis not present

## 2020-08-26 DIAGNOSIS — I7091 Generalized atherosclerosis: Secondary | ICD-10-CM | POA: Diagnosis not present

## 2020-10-07 DIAGNOSIS — I2510 Atherosclerotic heart disease of native coronary artery without angina pectoris: Secondary | ICD-10-CM | POA: Diagnosis not present

## 2020-10-07 DIAGNOSIS — G309 Alzheimer's disease, unspecified: Secondary | ICD-10-CM | POA: Diagnosis not present

## 2020-10-07 DIAGNOSIS — W19XXXA Unspecified fall, initial encounter: Secondary | ICD-10-CM | POA: Diagnosis not present

## 2020-10-08 DIAGNOSIS — I10 Essential (primary) hypertension: Secondary | ICD-10-CM | POA: Diagnosis not present

## 2020-10-08 DIAGNOSIS — R059 Cough, unspecified: Secondary | ICD-10-CM | POA: Diagnosis not present

## 2020-10-11 DIAGNOSIS — I5020 Unspecified systolic (congestive) heart failure: Secondary | ICD-10-CM | POA: Diagnosis not present

## 2020-10-11 DIAGNOSIS — N184 Chronic kidney disease, stage 4 (severe): Secondary | ICD-10-CM | POA: Diagnosis not present

## 2020-10-12 DIAGNOSIS — E870 Hyperosmolality and hypernatremia: Secondary | ICD-10-CM | POA: Diagnosis not present

## 2020-10-12 DIAGNOSIS — R531 Weakness: Secondary | ICD-10-CM | POA: Diagnosis not present

## 2020-10-12 DIAGNOSIS — Z6821 Body mass index (BMI) 21.0-21.9, adult: Secondary | ICD-10-CM | POA: Diagnosis not present

## 2020-10-13 DIAGNOSIS — N184 Chronic kidney disease, stage 4 (severe): Secondary | ICD-10-CM | POA: Diagnosis not present

## 2020-10-13 DIAGNOSIS — I5020 Unspecified systolic (congestive) heart failure: Secondary | ICD-10-CM | POA: Diagnosis not present

## 2020-10-16 DIAGNOSIS — I10 Essential (primary) hypertension: Secondary | ICD-10-CM | POA: Diagnosis not present

## 2020-10-21 DIAGNOSIS — G309 Alzheimer's disease, unspecified: Secondary | ICD-10-CM | POA: Diagnosis not present

## 2020-10-21 DIAGNOSIS — G4700 Insomnia, unspecified: Secondary | ICD-10-CM | POA: Diagnosis not present

## 2020-10-22 DIAGNOSIS — G301 Alzheimer's disease with late onset: Secondary | ICD-10-CM | POA: Diagnosis not present

## 2020-10-22 DIAGNOSIS — E440 Moderate protein-calorie malnutrition: Secondary | ICD-10-CM | POA: Diagnosis not present

## 2020-10-22 DIAGNOSIS — E559 Vitamin D deficiency, unspecified: Secondary | ICD-10-CM | POA: Diagnosis not present

## 2020-10-22 DIAGNOSIS — K5909 Other constipation: Secondary | ICD-10-CM | POA: Diagnosis not present

## 2020-10-22 DIAGNOSIS — I2510 Atherosclerotic heart disease of native coronary artery without angina pectoris: Secondary | ICD-10-CM | POA: Diagnosis not present

## 2020-12-07 DIAGNOSIS — G301 Alzheimer's disease with late onset: Secondary | ICD-10-CM | POA: Diagnosis not present

## 2020-12-07 DIAGNOSIS — K5909 Other constipation: Secondary | ICD-10-CM | POA: Diagnosis not present

## 2020-12-07 DIAGNOSIS — G4700 Insomnia, unspecified: Secondary | ICD-10-CM | POA: Diagnosis not present

## 2020-12-07 DIAGNOSIS — E440 Moderate protein-calorie malnutrition: Secondary | ICD-10-CM | POA: Diagnosis not present

## 2020-12-07 DIAGNOSIS — I2510 Atherosclerotic heart disease of native coronary artery without angina pectoris: Secondary | ICD-10-CM | POA: Diagnosis not present

## 2020-12-09 DIAGNOSIS — I7091 Generalized atherosclerosis: Secondary | ICD-10-CM | POA: Diagnosis not present

## 2021-01-14 DIAGNOSIS — M79642 Pain in left hand: Secondary | ICD-10-CM | POA: Diagnosis not present

## 2021-02-10 DIAGNOSIS — I7091 Generalized atherosclerosis: Secondary | ICD-10-CM | POA: Diagnosis not present

## 2021-02-15 DIAGNOSIS — G4700 Insomnia, unspecified: Secondary | ICD-10-CM | POA: Diagnosis not present

## 2021-02-15 DIAGNOSIS — E559 Vitamin D deficiency, unspecified: Secondary | ICD-10-CM | POA: Diagnosis not present

## 2021-02-15 DIAGNOSIS — K5909 Other constipation: Secondary | ICD-10-CM | POA: Diagnosis not present

## 2021-02-15 DIAGNOSIS — I2510 Atherosclerotic heart disease of native coronary artery without angina pectoris: Secondary | ICD-10-CM | POA: Diagnosis not present

## 2021-02-15 DIAGNOSIS — G309 Alzheimer's disease, unspecified: Secondary | ICD-10-CM | POA: Diagnosis not present

## 2021-02-15 DIAGNOSIS — R296 Repeated falls: Secondary | ICD-10-CM | POA: Diagnosis not present

## 2021-03-21 DIAGNOSIS — S01112A Laceration without foreign body of left eyelid and periocular area, initial encounter: Secondary | ICD-10-CM | POA: Diagnosis not present

## 2021-03-21 DIAGNOSIS — G309 Alzheimer's disease, unspecified: Secondary | ICD-10-CM | POA: Diagnosis not present

## 2021-03-21 DIAGNOSIS — R296 Repeated falls: Secondary | ICD-10-CM | POA: Diagnosis not present

## 2021-04-26 DIAGNOSIS — M810 Age-related osteoporosis without current pathological fracture: Secondary | ICD-10-CM | POA: Diagnosis not present

## 2021-04-26 DIAGNOSIS — G301 Alzheimer's disease with late onset: Secondary | ICD-10-CM | POA: Diagnosis not present

## 2021-04-26 DIAGNOSIS — E440 Moderate protein-calorie malnutrition: Secondary | ICD-10-CM | POA: Diagnosis not present

## 2021-04-26 DIAGNOSIS — E559 Vitamin D deficiency, unspecified: Secondary | ICD-10-CM | POA: Diagnosis not present

## 2021-04-26 DIAGNOSIS — I2510 Atherosclerotic heart disease of native coronary artery without angina pectoris: Secondary | ICD-10-CM | POA: Diagnosis not present

## 2021-05-06 DIAGNOSIS — I7091 Generalized atherosclerosis: Secondary | ICD-10-CM | POA: Diagnosis not present

## 2021-05-10 DIAGNOSIS — R109 Unspecified abdominal pain: Secondary | ICD-10-CM | POA: Diagnosis not present

## 2021-05-11 DIAGNOSIS — N390 Urinary tract infection, site not specified: Secondary | ICD-10-CM | POA: Diagnosis not present

## 2021-06-23 DIAGNOSIS — G301 Alzheimer's disease with late onset: Secondary | ICD-10-CM | POA: Diagnosis not present

## 2021-06-23 DIAGNOSIS — E440 Moderate protein-calorie malnutrition: Secondary | ICD-10-CM | POA: Diagnosis not present

## 2021-06-23 DIAGNOSIS — I2510 Atherosclerotic heart disease of native coronary artery without angina pectoris: Secondary | ICD-10-CM | POA: Diagnosis not present

## 2021-07-04 DIAGNOSIS — J302 Other seasonal allergic rhinitis: Secondary | ICD-10-CM | POA: Diagnosis not present

## 2021-07-26 DIAGNOSIS — M6281 Muscle weakness (generalized): Secondary | ICD-10-CM | POA: Diagnosis not present

## 2021-07-26 DIAGNOSIS — Z9181 History of falling: Secondary | ICD-10-CM | POA: Diagnosis not present

## 2021-07-26 DIAGNOSIS — R278 Other lack of coordination: Secondary | ICD-10-CM | POA: Diagnosis not present

## 2021-07-26 DIAGNOSIS — G301 Alzheimer's disease with late onset: Secondary | ICD-10-CM | POA: Diagnosis not present

## 2021-07-27 DIAGNOSIS — Z9181 History of falling: Secondary | ICD-10-CM | POA: Diagnosis not present

## 2021-07-27 DIAGNOSIS — G301 Alzheimer's disease with late onset: Secondary | ICD-10-CM | POA: Diagnosis not present

## 2021-07-27 DIAGNOSIS — M6281 Muscle weakness (generalized): Secondary | ICD-10-CM | POA: Diagnosis not present

## 2021-07-27 DIAGNOSIS — R278 Other lack of coordination: Secondary | ICD-10-CM | POA: Diagnosis not present

## 2021-07-28 DIAGNOSIS — G301 Alzheimer's disease with late onset: Secondary | ICD-10-CM | POA: Diagnosis not present

## 2021-07-28 DIAGNOSIS — M6281 Muscle weakness (generalized): Secondary | ICD-10-CM | POA: Diagnosis not present

## 2021-07-28 DIAGNOSIS — Z9181 History of falling: Secondary | ICD-10-CM | POA: Diagnosis not present

## 2021-07-28 DIAGNOSIS — R278 Other lack of coordination: Secondary | ICD-10-CM | POA: Diagnosis not present

## 2021-08-01 DIAGNOSIS — G301 Alzheimer's disease with late onset: Secondary | ICD-10-CM | POA: Diagnosis not present

## 2021-08-01 DIAGNOSIS — R278 Other lack of coordination: Secondary | ICD-10-CM | POA: Diagnosis not present

## 2021-08-01 DIAGNOSIS — Z9181 History of falling: Secondary | ICD-10-CM | POA: Diagnosis not present

## 2021-08-01 DIAGNOSIS — M6281 Muscle weakness (generalized): Secondary | ICD-10-CM | POA: Diagnosis not present

## 2021-08-02 DIAGNOSIS — T7840XA Allergy, unspecified, initial encounter: Secondary | ICD-10-CM | POA: Diagnosis not present

## 2021-08-02 DIAGNOSIS — R600 Localized edema: Secondary | ICD-10-CM | POA: Diagnosis not present

## 2021-08-03 DIAGNOSIS — R9082 White matter disease, unspecified: Secondary | ICD-10-CM | POA: Diagnosis not present

## 2021-08-03 DIAGNOSIS — Z881 Allergy status to other antibiotic agents status: Secondary | ICD-10-CM | POA: Diagnosis not present

## 2021-08-03 DIAGNOSIS — R531 Weakness: Secondary | ICD-10-CM | POA: Diagnosis not present

## 2021-08-03 DIAGNOSIS — R278 Other lack of coordination: Secondary | ICD-10-CM | POA: Diagnosis not present

## 2021-08-03 DIAGNOSIS — E860 Dehydration: Secondary | ICD-10-CM | POA: Diagnosis not present

## 2021-08-03 DIAGNOSIS — G319 Degenerative disease of nervous system, unspecified: Secondary | ICD-10-CM | POA: Diagnosis not present

## 2021-08-03 DIAGNOSIS — R7989 Other specified abnormal findings of blood chemistry: Secondary | ICD-10-CM | POA: Diagnosis not present

## 2021-08-03 DIAGNOSIS — Z9109 Other allergy status, other than to drugs and biological substances: Secondary | ICD-10-CM | POA: Diagnosis not present

## 2021-08-03 DIAGNOSIS — Z9102 Food additives allergy status: Secondary | ICD-10-CM | POA: Diagnosis not present

## 2021-08-03 DIAGNOSIS — R29810 Facial weakness: Secondary | ICD-10-CM | POA: Diagnosis not present

## 2021-08-03 DIAGNOSIS — Z20822 Contact with and (suspected) exposure to covid-19: Secondary | ICD-10-CM | POA: Diagnosis not present

## 2021-08-03 DIAGNOSIS — M6281 Muscle weakness (generalized): Secondary | ICD-10-CM | POA: Diagnosis not present

## 2021-08-03 DIAGNOSIS — Z743 Need for continuous supervision: Secondary | ICD-10-CM | POA: Diagnosis not present

## 2021-08-03 DIAGNOSIS — R001 Bradycardia, unspecified: Secondary | ICD-10-CM | POA: Diagnosis not present

## 2021-08-03 DIAGNOSIS — Z888 Allergy status to other drugs, medicaments and biological substances status: Secondary | ICD-10-CM | POA: Diagnosis not present

## 2021-08-03 DIAGNOSIS — I6782 Cerebral ischemia: Secondary | ICD-10-CM | POA: Diagnosis not present

## 2021-08-03 DIAGNOSIS — I959 Hypotension, unspecified: Secondary | ICD-10-CM | POA: Diagnosis not present

## 2021-08-03 DIAGNOSIS — I499 Cardiac arrhythmia, unspecified: Secondary | ICD-10-CM | POA: Diagnosis not present

## 2021-08-04 DIAGNOSIS — R001 Bradycardia, unspecified: Secondary | ICD-10-CM | POA: Diagnosis not present

## 2021-08-04 DIAGNOSIS — I2510 Atherosclerotic heart disease of native coronary artery without angina pectoris: Secondary | ICD-10-CM | POA: Diagnosis not present

## 2021-08-04 DIAGNOSIS — R7989 Other specified abnormal findings of blood chemistry: Secondary | ICD-10-CM | POA: Diagnosis not present

## 2021-08-04 DIAGNOSIS — R6889 Other general symptoms and signs: Secondary | ICD-10-CM | POA: Diagnosis not present

## 2021-08-04 DIAGNOSIS — R410 Disorientation, unspecified: Secondary | ICD-10-CM | POA: Diagnosis not present

## 2021-08-04 DIAGNOSIS — Z743 Need for continuous supervision: Secondary | ICD-10-CM | POA: Diagnosis not present

## 2021-08-04 DIAGNOSIS — R404 Transient alteration of awareness: Secondary | ICD-10-CM | POA: Diagnosis not present

## 2021-08-04 DIAGNOSIS — R278 Other lack of coordination: Secondary | ICD-10-CM | POA: Diagnosis not present

## 2021-08-04 DIAGNOSIS — M6281 Muscle weakness (generalized): Secondary | ICD-10-CM | POA: Diagnosis not present

## 2021-08-05 DIAGNOSIS — G301 Alzheimer's disease with late onset: Secondary | ICD-10-CM | POA: Diagnosis not present

## 2021-08-05 DIAGNOSIS — J302 Other seasonal allergic rhinitis: Secondary | ICD-10-CM | POA: Diagnosis not present

## 2021-08-05 DIAGNOSIS — F02C2 Dementia in other diseases classified elsewhere, severe, with psychotic disturbance: Secondary | ICD-10-CM | POA: Diagnosis not present

## 2021-08-05 DIAGNOSIS — I2510 Atherosclerotic heart disease of native coronary artery without angina pectoris: Secondary | ICD-10-CM | POA: Diagnosis not present

## 2021-08-05 DIAGNOSIS — R1310 Dysphagia, unspecified: Secondary | ICD-10-CM | POA: Diagnosis not present

## 2021-08-08 DIAGNOSIS — R278 Other lack of coordination: Secondary | ICD-10-CM | POA: Diagnosis not present

## 2021-08-08 DIAGNOSIS — M6281 Muscle weakness (generalized): Secondary | ICD-10-CM | POA: Diagnosis not present

## 2021-08-08 DIAGNOSIS — E559 Vitamin D deficiency, unspecified: Secondary | ICD-10-CM | POA: Diagnosis not present

## 2021-08-08 DIAGNOSIS — G301 Alzheimer's disease with late onset: Secondary | ICD-10-CM | POA: Diagnosis not present

## 2021-08-08 DIAGNOSIS — I2510 Atherosclerotic heart disease of native coronary artery without angina pectoris: Secondary | ICD-10-CM | POA: Diagnosis not present

## 2021-08-09 DIAGNOSIS — R001 Bradycardia, unspecified: Secondary | ICD-10-CM | POA: Diagnosis not present

## 2021-08-09 DIAGNOSIS — E440 Moderate protein-calorie malnutrition: Secondary | ICD-10-CM | POA: Diagnosis not present

## 2021-08-10 DIAGNOSIS — M6281 Muscle weakness (generalized): Secondary | ICD-10-CM | POA: Diagnosis not present

## 2021-08-10 DIAGNOSIS — R278 Other lack of coordination: Secondary | ICD-10-CM | POA: Diagnosis not present

## 2021-08-11 DIAGNOSIS — M6281 Muscle weakness (generalized): Secondary | ICD-10-CM | POA: Diagnosis not present

## 2021-08-11 DIAGNOSIS — R278 Other lack of coordination: Secondary | ICD-10-CM | POA: Diagnosis not present

## 2021-08-12 DIAGNOSIS — M6281 Muscle weakness (generalized): Secondary | ICD-10-CM | POA: Diagnosis not present

## 2021-08-12 DIAGNOSIS — R278 Other lack of coordination: Secondary | ICD-10-CM | POA: Diagnosis not present

## 2021-08-17 DIAGNOSIS — M6281 Muscle weakness (generalized): Secondary | ICD-10-CM | POA: Diagnosis not present

## 2021-08-17 DIAGNOSIS — R278 Other lack of coordination: Secondary | ICD-10-CM | POA: Diagnosis not present

## 2021-08-18 DIAGNOSIS — R278 Other lack of coordination: Secondary | ICD-10-CM | POA: Diagnosis not present

## 2021-08-18 DIAGNOSIS — M6281 Muscle weakness (generalized): Secondary | ICD-10-CM | POA: Diagnosis not present

## 2021-09-27 DIAGNOSIS — I7091 Generalized atherosclerosis: Secondary | ICD-10-CM | POA: Diagnosis not present

## 2021-10-10 DIAGNOSIS — R569 Unspecified convulsions: Secondary | ICD-10-CM | POA: Diagnosis not present

## 2021-10-10 DIAGNOSIS — Z91018 Allergy to other foods: Secondary | ICD-10-CM | POA: Diagnosis not present

## 2021-10-10 DIAGNOSIS — R911 Solitary pulmonary nodule: Secondary | ICD-10-CM | POA: Diagnosis not present

## 2021-10-10 DIAGNOSIS — Z79899 Other long term (current) drug therapy: Secondary | ICD-10-CM | POA: Diagnosis not present

## 2021-10-10 DIAGNOSIS — G319 Degenerative disease of nervous system, unspecified: Secondary | ICD-10-CM | POA: Diagnosis not present

## 2021-10-10 DIAGNOSIS — Z556 Problems related to health literacy: Secondary | ICD-10-CM | POA: Diagnosis not present

## 2021-10-10 DIAGNOSIS — G300 Alzheimer's disease with early onset: Secondary | ICD-10-CM | POA: Diagnosis not present

## 2021-10-10 DIAGNOSIS — Z66 Do not resuscitate: Secondary | ICD-10-CM | POA: Diagnosis not present

## 2021-10-10 DIAGNOSIS — J1569 Pneumonia due to other gram-negative bacteria: Secondary | ICD-10-CM | POA: Diagnosis not present

## 2021-10-10 DIAGNOSIS — I10 Essential (primary) hypertension: Secondary | ICD-10-CM | POA: Diagnosis not present

## 2021-10-10 DIAGNOSIS — Z888 Allergy status to other drugs, medicaments and biological substances status: Secondary | ICD-10-CM | POA: Diagnosis not present

## 2021-10-10 DIAGNOSIS — Z9109 Other allergy status, other than to drugs and biological substances: Secondary | ICD-10-CM | POA: Diagnosis not present

## 2021-10-10 DIAGNOSIS — Z743 Need for continuous supervision: Secondary | ICD-10-CM | POA: Diagnosis not present

## 2021-10-10 DIAGNOSIS — I517 Cardiomegaly: Secondary | ICD-10-CM | POA: Diagnosis not present

## 2021-10-10 DIAGNOSIS — F02C18 Dementia in other diseases classified elsewhere, severe, with other behavioral disturbance: Secondary | ICD-10-CM | POA: Diagnosis not present

## 2021-10-10 DIAGNOSIS — J90 Pleural effusion, not elsewhere classified: Secondary | ICD-10-CM | POA: Diagnosis not present

## 2021-10-10 DIAGNOSIS — Z7982 Long term (current) use of aspirin: Secondary | ICD-10-CM | POA: Diagnosis not present

## 2021-10-10 DIAGNOSIS — J984 Other disorders of lung: Secondary | ICD-10-CM | POA: Diagnosis not present

## 2021-10-10 DIAGNOSIS — Z881 Allergy status to other antibiotic agents status: Secondary | ICD-10-CM | POA: Diagnosis not present

## 2021-10-18 DIAGNOSIS — I2510 Atherosclerotic heart disease of native coronary artery without angina pectoris: Secondary | ICD-10-CM | POA: Diagnosis not present

## 2021-10-18 DIAGNOSIS — G301 Alzheimer's disease with late onset: Secondary | ICD-10-CM | POA: Diagnosis not present

## 2021-10-18 DIAGNOSIS — F02C18 Dementia in other diseases classified elsewhere, severe, with other behavioral disturbance: Secondary | ICD-10-CM | POA: Diagnosis not present

## 2021-10-18 DIAGNOSIS — Z7982 Long term (current) use of aspirin: Secondary | ICD-10-CM | POA: Diagnosis not present

## 2021-10-30 DIAGNOSIS — G301 Alzheimer's disease with late onset: Secondary | ICD-10-CM | POA: Diagnosis not present

## 2021-10-30 DIAGNOSIS — R0989 Other specified symptoms and signs involving the circulatory and respiratory systems: Secondary | ICD-10-CM | POA: Diagnosis not present

## 2021-10-30 DIAGNOSIS — I2510 Atherosclerotic heart disease of native coronary artery without angina pectoris: Secondary | ICD-10-CM | POA: Diagnosis not present

## 2021-11-01 DIAGNOSIS — G301 Alzheimer's disease with late onset: Secondary | ICD-10-CM | POA: Diagnosis not present

## 2021-11-07 DIAGNOSIS — G301 Alzheimer's disease with late onset: Secondary | ICD-10-CM | POA: Diagnosis not present

## 2021-11-07 DIAGNOSIS — F02811 Dementia in other diseases classified elsewhere, unspecified severity, with agitation: Secondary | ICD-10-CM | POA: Diagnosis not present

## 2021-11-17 DIAGNOSIS — G301 Alzheimer's disease with late onset: Secondary | ICD-10-CM | POA: Diagnosis not present

## 2021-11-17 DIAGNOSIS — F02818 Dementia in other diseases classified elsewhere, unspecified severity, with other behavioral disturbance: Secondary | ICD-10-CM | POA: Diagnosis not present

## 2021-11-22 DIAGNOSIS — H18413 Arcus senilis, bilateral: Secondary | ICD-10-CM | POA: Diagnosis not present

## 2021-11-22 DIAGNOSIS — H01001 Unspecified blepharitis right upper eyelid: Secondary | ICD-10-CM | POA: Diagnosis not present

## 2021-11-22 DIAGNOSIS — H2513 Age-related nuclear cataract, bilateral: Secondary | ICD-10-CM | POA: Diagnosis not present

## 2021-11-22 DIAGNOSIS — H01004 Unspecified blepharitis left upper eyelid: Secondary | ICD-10-CM | POA: Diagnosis not present

## 2021-11-23 DIAGNOSIS — Z66 Do not resuscitate: Secondary | ICD-10-CM | POA: Diagnosis not present

## 2021-11-23 DIAGNOSIS — F02C4 Dementia in other diseases classified elsewhere, severe, with anxiety: Secondary | ICD-10-CM | POA: Diagnosis not present

## 2021-11-23 DIAGNOSIS — G301 Alzheimer's disease with late onset: Secondary | ICD-10-CM | POA: Diagnosis not present

## 2021-11-23 DIAGNOSIS — F02C2 Dementia in other diseases classified elsewhere, severe, with psychotic disturbance: Secondary | ICD-10-CM | POA: Diagnosis not present

## 2021-12-15 DIAGNOSIS — R296 Repeated falls: Secondary | ICD-10-CM | POA: Diagnosis not present

## 2021-12-15 DIAGNOSIS — Z66 Do not resuscitate: Secondary | ICD-10-CM | POA: Diagnosis not present

## 2021-12-15 DIAGNOSIS — G301 Alzheimer's disease with late onset: Secondary | ICD-10-CM | POA: Diagnosis not present

## 2021-12-15 DIAGNOSIS — R2681 Unsteadiness on feet: Secondary | ICD-10-CM | POA: Diagnosis not present

## 2021-12-20 DIAGNOSIS — R8290 Unspecified abnormal findings in urine: Secondary | ICD-10-CM | POA: Diagnosis not present

## 2021-12-20 DIAGNOSIS — G301 Alzheimer's disease with late onset: Secondary | ICD-10-CM | POA: Diagnosis not present

## 2021-12-23 DIAGNOSIS — E559 Vitamin D deficiency, unspecified: Secondary | ICD-10-CM | POA: Diagnosis not present

## 2021-12-27 DIAGNOSIS — G301 Alzheimer's disease with late onset: Secondary | ICD-10-CM | POA: Diagnosis not present

## 2021-12-27 DIAGNOSIS — E7220 Disorder of urea cycle metabolism, unspecified: Secondary | ICD-10-CM | POA: Diagnosis not present

## 2021-12-30 DIAGNOSIS — E7220 Disorder of urea cycle metabolism, unspecified: Secondary | ICD-10-CM | POA: Diagnosis not present

## 2022-01-04 DIAGNOSIS — G309 Alzheimer's disease, unspecified: Secondary | ICD-10-CM | POA: Diagnosis not present

## 2022-01-04 DIAGNOSIS — F02C0 Dementia in other diseases classified elsewhere, severe, without behavioral disturbance, psychotic disturbance, mood disturbance, and anxiety: Secondary | ICD-10-CM | POA: Diagnosis not present

## 2022-01-05 DIAGNOSIS — G4709 Other insomnia: Secondary | ICD-10-CM | POA: Diagnosis not present

## 2022-01-05 DIAGNOSIS — G301 Alzheimer's disease with late onset: Secondary | ICD-10-CM | POA: Diagnosis not present

## 2022-01-09 DIAGNOSIS — G301 Alzheimer's disease with late onset: Secondary | ICD-10-CM | POA: Diagnosis not present

## 2022-01-30 DIAGNOSIS — I2510 Atherosclerotic heart disease of native coronary artery without angina pectoris: Secondary | ICD-10-CM | POA: Diagnosis not present

## 2022-01-30 DIAGNOSIS — R262 Difficulty in walking, not elsewhere classified: Secondary | ICD-10-CM | POA: Diagnosis not present

## 2022-01-30 DIAGNOSIS — I7091 Generalized atherosclerosis: Secondary | ICD-10-CM | POA: Diagnosis not present

## 2022-01-30 DIAGNOSIS — G301 Alzheimer's disease with late onset: Secondary | ICD-10-CM | POA: Diagnosis not present

## 2022-01-30 DIAGNOSIS — R296 Repeated falls: Secondary | ICD-10-CM | POA: Diagnosis not present

## 2022-02-01 DIAGNOSIS — G301 Alzheimer's disease with late onset: Secondary | ICD-10-CM | POA: Diagnosis not present

## 2022-02-01 DIAGNOSIS — F02C2 Dementia in other diseases classified elsewhere, severe, with psychotic disturbance: Secondary | ICD-10-CM | POA: Diagnosis not present

## 2022-02-04 DIAGNOSIS — R0989 Other specified symptoms and signs involving the circulatory and respiratory systems: Secondary | ICD-10-CM | POA: Diagnosis not present

## 2022-02-04 DIAGNOSIS — J90 Pleural effusion, not elsewhere classified: Secondary | ICD-10-CM | POA: Diagnosis not present

## 2022-02-04 DIAGNOSIS — R918 Other nonspecific abnormal finding of lung field: Secondary | ICD-10-CM | POA: Diagnosis not present

## 2022-02-06 DIAGNOSIS — G301 Alzheimer's disease with late onset: Secondary | ICD-10-CM | POA: Diagnosis not present

## 2022-03-03 DEATH — deceased

## 2022-03-23 IMAGING — DX DG CHEST 1V PORT
1 series · 1 of 1 positions shown · non-contrast
Comparison: Radiograph 01/03/2017. Included portions from coronary
CT 03/31/2016

CLINICAL DATA: Agitation and altered mental status. History of
Alzheimer's.

EXAM:
PORTABLE CHEST 1 VIEW

[chest ap]
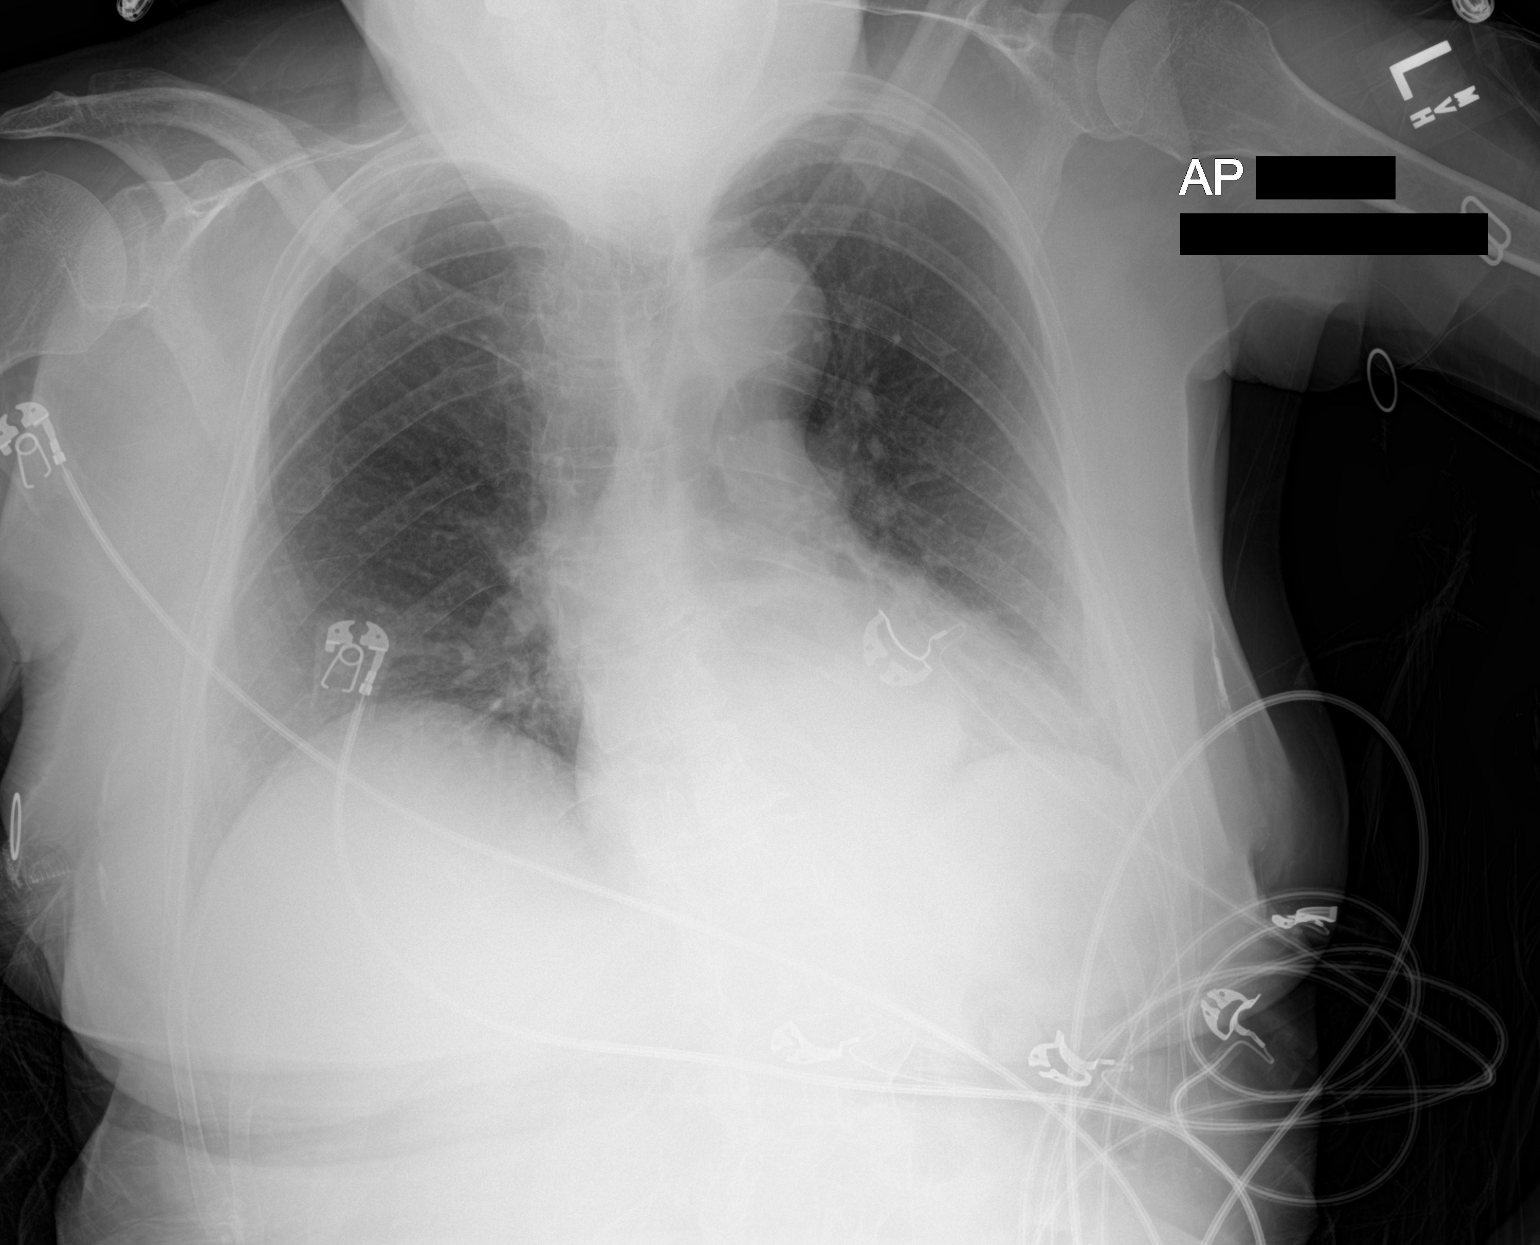

[1 of 1 positions shown; findings below may reference images not displayed]

FINDINGS: Lung volumes are low. The heart is normal in size. Retrocardiac
hiatal hernia. Pulmonary vasculature is normal. No consolidation,
pleural effusion, or pneumothorax. No acute osseous abnormalities
are seen.
IMPRESSION: Low lung volumes without acute chest finding.

Large retrocardiac hiatal hernia.

## 2022-04-05 IMAGING — DX DG CHEST 1V PORT
1 series · 1 of 1 positions shown · non-contrast
Comparison: 07/08/2019

CLINICAL DATA: Shortness of breath.

EXAM:
PORTABLE CHEST 1 VIEW

[chest ap]
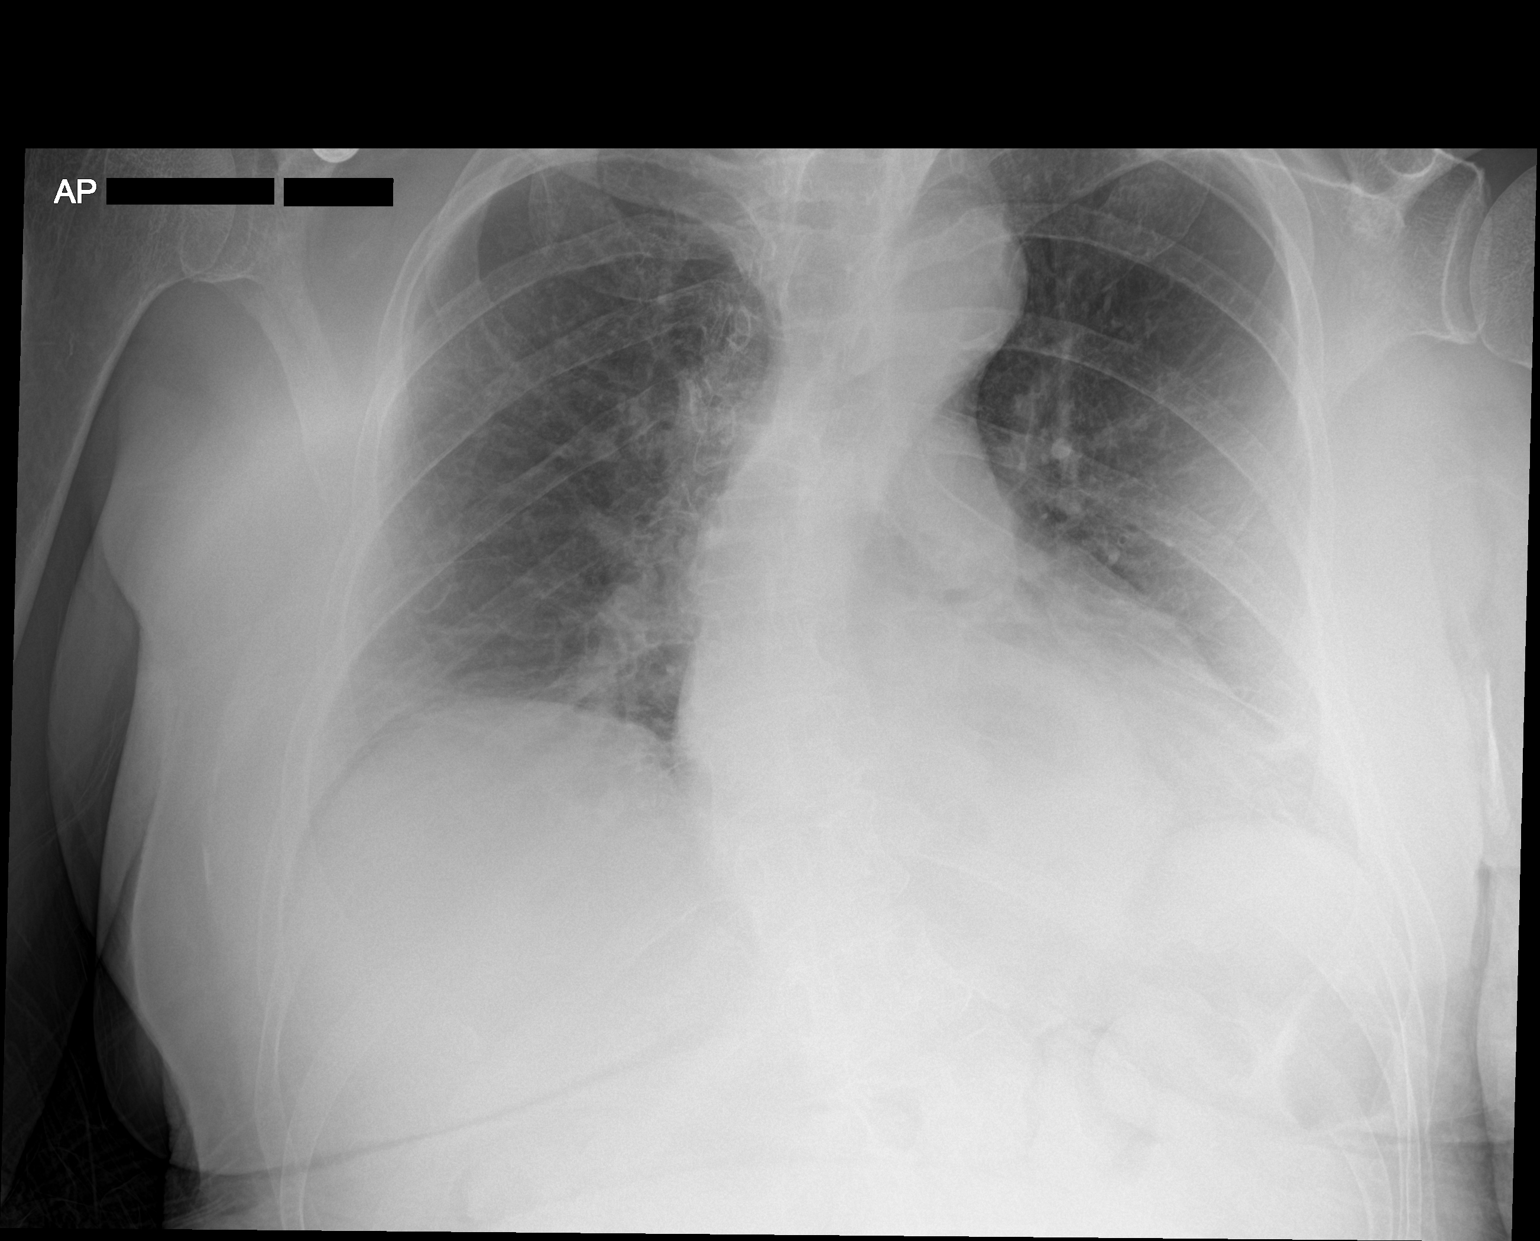

[1 of 1 positions shown; findings below may reference images not displayed]

FINDINGS: The heart is borderline enlarged but stable. Stable tortuosity of
the thoracic aorta.

Stable moderate to large hiatal hernia.

Streaky bibasilar atelectasis but no infiltrates or effusion. The
bony thorax is intact.
IMPRESSION: Streaky bibasilar atelectasis.
